# Patient Record
Sex: Female | Born: 1960 | Race: White | Hispanic: No | Marital: Married | Smoking: Never smoker
Health system: Southern US, Community
[De-identification: ages and names within clinical notes are randomized; demographics above are authoritative.]

## PROBLEM LIST (undated history)

## (undated) DIAGNOSIS — Z803 Family history of malignant neoplasm of breast: Secondary | ICD-10-CM

## (undated) DIAGNOSIS — N319 Neuromuscular dysfunction of bladder, unspecified: Secondary | ICD-10-CM

## (undated) DIAGNOSIS — IMO0001 Reserved for inherently not codable concepts without codable children: Secondary | ICD-10-CM

## (undated) DIAGNOSIS — G43909 Migraine, unspecified, not intractable, without status migrainosus: Secondary | ICD-10-CM

## (undated) DIAGNOSIS — C50411 Malignant neoplasm of upper-outer quadrant of right female breast: Principal | ICD-10-CM

## (undated) DIAGNOSIS — C50919 Malignant neoplasm of unspecified site of unspecified female breast: Secondary | ICD-10-CM

## (undated) DIAGNOSIS — Z973 Presence of spectacles and contact lenses: Secondary | ICD-10-CM

## (undated) DIAGNOSIS — Z800 Family history of malignant neoplasm of digestive organs: Secondary | ICD-10-CM

## (undated) DIAGNOSIS — M8580 Other specified disorders of bone density and structure, unspecified site: Secondary | ICD-10-CM

## (undated) DIAGNOSIS — IMO0002 Reserved for concepts with insufficient information to code with codable children: Secondary | ICD-10-CM

## (undated) HISTORY — DX: Other specified disorders of bone density and structure, unspecified site: M85.80

## (undated) HISTORY — DX: Reserved for concepts with insufficient information to code with codable children: IMO0002

## (undated) HISTORY — DX: Reserved for inherently not codable concepts without codable children: IMO0001

## (undated) HISTORY — DX: Malignant neoplasm of upper-outer quadrant of right female breast: C50.411

## (undated) HISTORY — PX: VEIN LIGATION: SHX2652

## (undated) HISTORY — PX: UPPER GI ENDOSCOPY: SHX6162

## (undated) HISTORY — DX: Family history of malignant neoplasm of digestive organs: Z80.0

## (undated) HISTORY — PX: COLONOSCOPY: SHX174

## (undated) HISTORY — DX: Migraine, unspecified, not intractable, without status migrainosus: G43.909

## (undated) HISTORY — PX: BREAST SURGERY: SHX581

## (undated) HISTORY — DX: Malignant neoplasm of unspecified site of unspecified female breast: C50.919

## (undated) HISTORY — DX: Neuromuscular dysfunction of bladder, unspecified: N31.9

## (undated) HISTORY — DX: Family history of malignant neoplasm of breast: Z80.3

---

## 2004-08-19 ENCOUNTER — Other Ambulatory Visit: Admission: RE | Admit: 2004-08-19 | Discharge: 2004-08-19 | Payer: Self-pay | Admitting: Radiology

## 2014-06-28 DIAGNOSIS — C50919 Malignant neoplasm of unspecified site of unspecified female breast: Secondary | ICD-10-CM

## 2014-06-28 HISTORY — DX: Malignant neoplasm of unspecified site of unspecified female breast: C50.919

## 2014-10-11 ENCOUNTER — Encounter: Payer: Self-pay | Admitting: *Deleted

## 2014-10-11 ENCOUNTER — Telehealth: Payer: Self-pay | Admitting: *Deleted

## 2014-10-11 DIAGNOSIS — C50411 Malignant neoplasm of upper-outer quadrant of right female breast: Secondary | ICD-10-CM

## 2014-10-11 DIAGNOSIS — C50412 Malignant neoplasm of upper-outer quadrant of left female breast: Secondary | ICD-10-CM | POA: Insufficient documentation

## 2014-10-11 HISTORY — DX: Malignant neoplasm of upper-outer quadrant of right female breast: C50.411

## 2014-10-11 NOTE — Telephone Encounter (Signed)
Confirmed [facility] for 10/16/14 at 1200 .  Instructions and contact information given.

## 2014-10-16 ENCOUNTER — Encounter: Payer: Self-pay | Admitting: Physical Therapy

## 2014-10-16 ENCOUNTER — Ambulatory Visit (HOSPITAL_BASED_OUTPATIENT_CLINIC_OR_DEPARTMENT_OTHER): Payer: PRIVATE HEALTH INSURANCE | Admitting: Hematology and Oncology

## 2014-10-16 ENCOUNTER — Ambulatory Visit
Admission: RE | Admit: 2014-10-16 | Discharge: 2014-10-16 | Disposition: A | Discharge status: Home / Self Care | Payer: PRIVATE HEALTH INSURANCE | Source: Ambulatory Visit | Attending: Radiation Oncology | Admitting: Radiation Oncology

## 2014-10-16 ENCOUNTER — Encounter (INDEPENDENT_AMBULATORY_CARE_PROVIDER_SITE_OTHER): Payer: Self-pay

## 2014-10-16 ENCOUNTER — Encounter: Payer: Self-pay | Admitting: Hematology and Oncology

## 2014-10-16 ENCOUNTER — Ambulatory Visit: Payer: Self-pay | Admitting: Surgery

## 2014-10-16 ENCOUNTER — Telehealth: Payer: Self-pay | Admitting: Hematology and Oncology

## 2014-10-16 ENCOUNTER — Ambulatory Visit: Payer: PRIVATE HEALTH INSURANCE | Attending: Surgery | Admitting: Physical Therapy

## 2014-10-16 ENCOUNTER — Other Ambulatory Visit (HOSPITAL_BASED_OUTPATIENT_CLINIC_OR_DEPARTMENT_OTHER): Payer: PRIVATE HEALTH INSURANCE

## 2014-10-16 ENCOUNTER — Ambulatory Visit: Payer: PRIVATE HEALTH INSURANCE

## 2014-10-16 ENCOUNTER — Other Ambulatory Visit: Payer: Self-pay | Admitting: Hematology and Oncology

## 2014-10-16 VITALS — BP 93/80 | HR 73 | Temp 98.0°F | Resp 18 | Ht 60.0 in | Wt 112.7 lb

## 2014-10-16 DIAGNOSIS — Z9189 Other specified personal risk factors, not elsewhere classified: Secondary | ICD-10-CM

## 2014-10-16 DIAGNOSIS — Z170 Estrogen receptor positive status [ER+]: Secondary | ICD-10-CM | POA: Diagnosis not present

## 2014-10-16 DIAGNOSIS — C50412 Malignant neoplasm of upper-outer quadrant of left female breast: Secondary | ICD-10-CM

## 2014-10-16 DIAGNOSIS — C50411 Malignant neoplasm of upper-outer quadrant of right female breast: Secondary | ICD-10-CM

## 2014-10-16 LAB — CBC WITH DIFFERENTIAL/PLATELET
BASO%: 0.5 % (ref 0.0–2.0)
BASOS ABS: 0 10*3/uL (ref 0.0–0.1)
EOS ABS: 0.1 10*3/uL (ref 0.0–0.5)
EOS%: 0.8 % (ref 0.0–7.0)
HCT: 39.4 % (ref 34.8–46.6)
HGB: 13.1 g/dL (ref 11.6–15.9)
LYMPH%: 28.1 % (ref 14.0–49.7)
MCH: 30.3 pg (ref 25.1–34.0)
MCHC: 33.2 g/dL (ref 31.5–36.0)
MCV: 91 fL (ref 79.5–101.0)
MONO#: 0.7 10*3/uL (ref 0.1–0.9)
MONO%: 7.6 % (ref 0.0–14.0)
NEUT%: 63 % (ref 38.4–76.8)
NEUTROS ABS: 5.5 10*3/uL (ref 1.5–6.5)
PLATELETS: 202 10*3/uL (ref 145–400)
RBC: 4.33 10*6/uL (ref 3.70–5.45)
RDW: 13 % (ref 11.2–14.5)
WBC: 8.7 10*3/uL (ref 3.9–10.3)
lymph#: 2.4 10*3/uL (ref 0.9–3.3)

## 2014-10-16 LAB — COMPREHENSIVE METABOLIC PANEL (CC13)
ALT: 24 U/L (ref 0–55)
AST: 28 U/L (ref 5–34)
Albumin: 4 g/dL (ref 3.5–5.0)
Alkaline Phosphatase: 77 U/L (ref 40–150)
Anion Gap: 13 mEq/L — ABNORMAL HIGH (ref 3–11)
BUN: 10 mg/dL (ref 7.0–26.0)
CHLORIDE: 102 meq/L (ref 98–109)
CO2: 24 meq/L (ref 22–29)
CREATININE: 0.7 mg/dL (ref 0.6–1.1)
Calcium: 8.6 mg/dL (ref 8.4–10.4)
EGFR: >90 mL/min/{1.73_m2} (ref >90)
GLUCOSE: 140 mg/dL (ref 70–140)
Potassium: 4.1 mEq/L (ref 3.5–5.1)
Sodium: 139 mEq/L (ref 136–145)
TOTAL PROTEIN: 6.7 g/dL (ref 6.4–8.3)
Total Bilirubin: 0.38 mg/dL (ref 0.20–1.20)

## 2014-10-16 MED ORDER — DEXAMETHASONE 4 MG PO TABS: 4.0000 mg | ORAL_TABLET | Freq: Two times a day (BID) | ORAL | Status: DC

## 2014-10-16 MED ORDER — LIDOCAINE-PRILOCAINE 2.5-2.5 % EX CREA: TOPICAL_CREAM | CUTANEOUS | Status: DC

## 2014-10-16 MED ORDER — LORAZEPAM 0.5 MG PO TABS: 0.5000 mg | ORAL_TABLET | Freq: Four times a day (QID) | ORAL | Status: DC | PRN

## 2014-10-16 MED ORDER — PROCHLORPERAZINE MALEATE 10 MG PO TABS: 10.0000 mg | ORAL_TABLET | Freq: Four times a day (QID) | ORAL | Status: DC | PRN

## 2014-10-16 MED ORDER — ONDANSETRON HCL 8 MG PO TABS: 8.0000 mg | ORAL_TABLET | Freq: Two times a day (BID) | ORAL | Status: DC

## 2014-10-16 NOTE — Therapy (Signed)
[place], [place], [address] Phone: [phone]   Fax:  [phone]  Physical Therapy Evaluation  Patient Details  Name: [name] [name] MRN: [id number] Date of Birth: Oct 31, 1960 Referring Provider:  Erroll Luna, MD  Encounter Date: 10/16/2014      PT End of Session - 10/16/14 1704    Visit Number 1   Number of Visits 1   PT Start Time 1545   PT Stop Time 1612   PT Time Calculation (min) 27 min   Activity Tolerance Patient tolerated treatment well   Behavior During Therapy [facility] for tasks assessed/performed      Past Medical History  Diagnosis Date  . Breast cancer of upper-outer quadrant of right female breast 10/11/2014  . Osteopenia   . Migraines   . Neurogenic bladder     Past Surgical History  Procedure Laterality Date  . Vein ligation      There were no vitals filed for this visit.  Visit Diagnosis:  Carcinoma of upper-outer quadrant of left female breast - Plan: PT plan of care cert/re-cert  At risk for lymphedema - Plan: PT plan of care cert/re-cert      Subjective Assessment - 10/16/14 1658    Subjective Patient was seen today for a baseline assessment of her newly diagnosed left breast cancer.   Patient is accompained by: Family member   Pertinent History Diagnosed 10/03/14 with left ER/PR negative, HER2 positive breast cancer.  She also has a positive left axillary node.   Patient Stated Goals Learn post op shoulder ROM HEP and lymphedema risk reduction.   Currently in Pain? No/denies            [facility] PT Assessment - 10/16/14 0001    Assessment   Medical Diagnosis Left breast cancer   Onset Date 10/03/14   Precautions   Precautions Other (comment)  Active cancer   Restrictions   Weight Bearing Restrictions No   Balance Screen   Has the patient fallen in the past 6 months No   Has the patient had a decrease in activity level because of a fear of falling?  No    Is the patient reluctant to leave their home because of a fear of falling?  No   Home Environment   Living Enviornment Private residence   Living Arrangements Spouse/significant other;Children  53 y.o. 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   Leisure She plays tennis, golf, runs, Pilates - some kind of cardio 3-4x/wk for 30-60 minutes   Cognition   Overall Cognitive Status Within Functional Limits for tasks assessed   Posture/Postural Control   Posture/Postural Control No significant limitations   ROM / Strength   AROM / PROM / Strength AROM;Strength   AROM   AROM Assessment Site Shoulder   Right/Left Shoulder Right;Left   Right Shoulder Extension 50 Degrees   Right Shoulder Flexion 146 Degrees   Right Shoulder ABduction 158 Degrees   Right Shoulder Internal Rotation 66 Degrees   Right Shoulder External Rotation 86 Degrees   Left Shoulder Extension 55 Degrees   Left Shoulder Flexion 145 Degrees   Left Shoulder ABduction 161 Degrees   Left Shoulder Internal Rotation 70 Degrees   Left Shoulder External Rotation 82 Degrees   Strength   Overall  Strength Within functional limits for tasks performed           LYMPHEDEMA/ONCOLOGY QUESTIONNAIRE - 10/16/14 1702    Type   Cancer Type Left breast   Lymphedema Assessments   Lymphedema Assessments Upper extremities   Right Upper Extremity Lymphedema   10 cm Proximal to Olecranon Process 22.9 cm   Olecranon Process 21.5 cm   10 cm Proximal to Ulnar Styloid Process 18.5 cm   Just Proximal to Ulnar Styloid Process 13.4 cm   Across Hand at [employer] 16.4 cm   At [place] of 2nd Digit 5.5 cm   Left Upper Extremity Lymphedema   10 cm Proximal to Olecranon Process 23.4 cm   Olecranon Process 21.6 cm   10 cm Proximal to Ulnar  Styloid Process 18.2 cm   Just Proximal to Ulnar Styloid Process 13.3 cm   Across Hand at [employer] 16.4 cm   At [place] of 2nd Digit 6 cm       Patient was instructed today in a home exercise program today for post op shoulder range of motion. These included active assist shoulder flexion in sitting, scapular retraction, wall walking with shoulder abduction, and hands behind head external rotation.  She was encouraged to do these twice a day, holding 3 seconds and repeating 5 times when permitted by her physician.         PT Education - 10/16/14 1704    Education provided Yes   Education Details Post op HEP and lymphedema risk reduction   Person(s) Educated Patient;Spouse   Methods Explanation;Demonstration;Handout   Comprehension Verbalized understanding;Returned demonstration              Breast Clinic Goals - 10/16/14 1707    Patient will be able to verbalize understanding of pertinent lymphedema risk reduction practices relevant to her diagnosis specifically related to skin care.   Time 1   Period Days   Status Achieved   Patient will be able to return demonstrate and/or verbalize understanding of the post-op home exercise program related to regaining shoulder range of motion.   Time 1   Period Days   Status Achieved   Patient will be able to verbalize understanding of the importance of attending the postoperative After Breast Cancer Class for further lymphedema risk reduction education and therapeutic exercise.   Time 1   Period Days   Status Achieved              Plan - 10/16/14 1704    Clinical Impression Statement Patient was seen today for an assessment related to her left breast cancer.  She is planning to have neoadjuvant chemotherapy followed by either a left lumpectomy and sentinel node biopsy versus axillary lymph node dissection or a bilateral mastectomy with a elft sentinel node biopsy versus an axillary node dissection.  This will be followed  by radiation.  She will benefit from post operative PT to regain shoulder ROM and strength and reduce lymphjedema risk.   Pt will benefit from skilled therapeutic intervention in order to improve on the following deficits Decreased strength;Pain;Decreased knowledge of use of DME;Impaired UE functional use;Increased edema;Decreased range of motion   Rehab Potential Excellent   Clinical Impairments Affecting Rehab Potential none   PT Frequency One time visit   PT Treatment/Interventions Therapeutic exercise;Patient/family education   Consulted and Agree with Plan of Care Patient;Family member/caregiver   Family Member Consulted husband     Patient will follow up at outpatient cancer rehab if  needed following surgery.  If the patient requires physical therapy at that time, a specific plan will be dictated and sent to the referring physician for approval. The patient was educated today on appropriate basic range of motion exercises to begin post operatively and the importance of attending the After Breast Cancer class following surgery.  Patient was educated today on lymphedema risk reduction practices as it pertains to recommendations that will benefit the patient immediately following surgery.  She verbalized good understanding.  No additional physical therapy is indicated at this time.       Problem List Patient Active Problem List   Diagnosis Date Noted  . Breast cancer of upper-outer quadrant of left female breast 10/11/2014    Annia Friendly, PT 10/16/2014, 5:09 PM  [place], [place], [address] Phone: [phone]   Fax:  [phone]

## 2014-10-16 NOTE — H&P (Signed)
[name] [name] 10/16/2014 7:49 AM Location: [facility] Patient #: [id number] DOB: 06/18/1961 Undefined / Language: [name] [name] / Race: Undefined Female History of Present Illness Marcello Moores A. Arieonna Medine MD; 10/16/2014 3:55 PM) Patient words: pt presents to the [facility] for left breast cancer. Sent at the request of Dr Dwana Curd.    1.7 cm mass left breast 2 oclock with positive LN bx.  no other complaints.  The patient is a 54 year old female who presents with breast cancer. 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 Tumor markers include HER -2/neu positive. Disease involvement includes ipsilateral axillary lymph nodes. No associated conditions are noted. The patient was referred by a specialty consultant. Initial presentation was 1 month(s) ago. Other Problems Anderson Malta [place], [place]; 10/16/2014 7:49 AM) Anxiety Disorder Asthma Bladder Problems Breast Cancer General anesthesia - complications Hemorrhoids Lump In Breast Migraine Headache  Past Surgical History Anderson Malta [place], RMA; 10/16/2014 7:49 AM) Breast Biopsy Bilateral. multiple Colon Polyp Removal - Colonoscopy Oral Surgery  Diagnostic Studies History Anderson Malta [place], RMA; 10/16/2014 7:49 AM) Colonoscopy 1-5 years ago Mammogram within last year Pap Smear 1-5 years ago  Social History Anderson Malta [place], RMA; 10/16/2014 7:49 AM) Alcohol use Occasional alcohol use. Caffeine use Coffee, Tea. No drug use Tobacco use Never smoker.  Family History Anderson Malta [place], [place]; 10/16/2014 7:49 AM) Alcohol Abuse Family Members In General. Arthritis Father. Breast Cancer Family Members In General. Cancer Mother. Colon Cancer Brother, Father. Colon Polyps Brother, Father. Diabetes Mellitus Family Members In General. Heart Disease Family Members In General. Hypertension Family Members In General. Melanoma Brother. Respiratory Condition Family  Members In General. Thyroid problems Father.  Pregnancy / Birth History Anderson Malta [place], [place]; 10/16/2014 7:50 AM) Age at menarche 24 years. Contraceptive History Oral contraceptives. Gravida 3 Maternal age 58-30 Para 3 Regular periods     Review of Systems Anderson Malta [place] RMA; 10/16/2014 7:50 AM) General Present- Fatigue and Night Sweats. Not Present- Appetite Loss, Chills, Fever, Weight Gain and Weight Loss. Skin Present- Change in Wart/Mole and Rash. Not Present- Dryness, Hives, Jaundice, New Lesions, Non-Healing Wounds and Ulcer. HEENT Present- Wears glasses/contact lenses. Not Present- Earache, Hearing Loss, Hoarseness, Nose Bleed, Oral Ulcers, Ringing in the Ears, Seasonal Allergies, Sinus Pain, Sore Throat, Visual Disturbances and Yellow Eyes. Respiratory Not Present- Bloody sputum, Chronic Cough, Difficulty Breathing, Snoring and Wheezing. Breast Present- Breast Mass, Breast Pain and Skin Changes. Not Present- Nipple Discharge. Cardiovascular Not Present- Chest Pain, Difficulty Breathing Lying Down, Leg Cramps, Palpitations, Rapid Heart Rate, Shortness of Breath and Swelling of Extremities. Gastrointestinal Not Present- Abdominal Pain, Bloating, Bloody Stool, Change in Bowel Habits, Chronic diarrhea, Constipation, Difficulty Swallowing, Excessive gas, Gets full quickly at meals, Hemorrhoids, Indigestion, Nausea, Rectal Pain and Vomiting. Female Genitourinary Not Present- Frequency, Nocturia, Painful Urination, Pelvic Pain and Urgency. Musculoskeletal Not Present- Back Pain, Joint Pain, Joint Stiffness, Muscle Pain, Muscle Weakness and Swelling of Extremities. Neurological Not Present- Decreased Memory, Fainting, Headaches, Numbness, Seizures, Tingling, Tremor, Trouble walking and Weakness. Psychiatric Not Present- Anxiety, Bipolar, Change in Sleep Pattern, Depression, Fearful and Frequent crying. Hematology Not Present- Easy Bruising, Excessive bleeding, Gland problems, HIV and  Persistent Infections.   Physical Exam ([place]et [place] A. Ersilia Brawley MD; 10/16/2014 3:57 PM)  Chest and Lung Exam Chest and lung exam reveals -quiet, even and easy respiratory effort with no use of accessory muscles and on auscultation, normal breath sounds, no adventitious sounds and normal vocal resonance. Inspection Chest Wall - Normal. Back -  normal.  Breast Note: moblie 1 cm left breast mass left axilla LN palpable  right breast normal  B cup size   Cardiovascular Cardiovascular examination reveals -on palpation PMI is normal in location and amplitude, no palpable S3 or S4. Normal cardiac borders., normal heart sounds, regular rate and rhythm with no murmurs, carotid auscultation reveals no bruits and normal pedal pulses bilaterally.  Neurologic Neurologic evaluation reveals -alert and oriented x 3 with no impairment of recent or remote memory. Mental Status-Normal.  Musculoskeletal Normal Exam - Left-Upper Extremity Strength Normal and Lower Extremity Strength Normal. Normal Exam - Right-Upper Extremity Strength Normal, Lower Extremity Weakness.  Lymphatic Axillary -Note:left axillary LN palpable .  right axilla normal.     Assessment & Plan (Atari Novick A. Veasna Santibanez MD; 10/16/2014 3:59 PM)  BREAST CANCER, STAGE 2, LEFT (174.9  C50.912) Impression: given positive node, chemotherapy recommended and patient desires bilateral mastectomy and reconstruction. good candidate for breast conservation as well. Good candidate for NSM as well. Will set up pot. Risk bleeding, infection, PTX hemothorax, migration malfuction, embolization, and the need for mor surgery. She agrees to proceed.

## 2014-10-16 NOTE — Progress Notes (Signed)
Checked in new pt with no financial concerns prior to seeing the dr. Informed pt if chemo is part of her treatment we will call her ins to see if Josem Kaufmann is req and will obtain it if it is as well as contact foundations that offer copay assistance for chemo if needed. She has my card for any billing questions or concerns.

## 2014-10-16 NOTE — Progress Notes (Signed)
Radiation Oncology         [phone] ________________________________  Initial outpatient Consultation  Name: [name] [name] MRN: [id number]  Date: 10/16/2014  DOB: [birth year]  TM:LYYTKPT, [name] Kidney, MD  Erroll Luna, MD   REFERRING PHYSICIAN: Erroll Luna, MD  DIAGNOSIS: Clinical stage II invasive ductal carcinoma of the left breast (T1c, N1, MX)  HISTORY OF PRESENT ILLNESS::[name] [name] is a 54 y.o. female who is seen out courtesy of Dr. Brantley Stage as part of the multidisciplinary breast clinic. Earlier this year the patient palpated a mass within the left axillary region followed by a palpable mass in the upper outer quadrant of the left breast. On mammogram both these areas were visible. By ultrasound the lesion in the upper outer quadrant measured approximately 1.7 cm, 2:00 position. This area was biopsied which revealed grade 3 invasive ductal carcinoma. Left axillary lymph node was also positive on biopsy. Tumor was ER and PR negative with HER-2/neu amplification with a ratio of 5.32.  the patient's imaging studies and case was presented earlier today at the multidisciplinary breast conference.  PREVIOUS RADIATION THERAPY: No  PAST MEDICAL HISTORY:  has a past medical history of Breast cancer of upper-outer quadrant of right female breast (10/11/2014); Osteopenia; Migraines; and Neurogenic bladder.    PAST SURGICAL HISTORY: Past Surgical History  Procedure Laterality Date  . Vein ligation      FAMILY HISTORY: family history includes Brain cancer in her mother; Breast cancer in her paternal aunt; Colon cancer in her father and maternal uncle; Lung cancer in her maternal grandfather and paternal grandfather.  SOCIAL HISTORY:  reports that she has never smoked. She does not have any smokeless tobacco history on file. She reports that she drinks alcohol. She reports that she does not use illicit drugs.  ALLERGIES: Butorphanol; Meperidine; and Sulfa antibiotics  MEDICATIONS:    Current Outpatient Prescriptions  Medication Sig Dispense Refill  . acetaminophen (TYLENOL) 325 MG tablet Take 650 mg by mouth every 6 (six) hours as needed.    . Aspirin-Acetaminophen-Caffeine (GOODYS EXTRA STRENGTH PO) Take by mouth.    Marland Kitchen atenolol-chlorthalidone (TENORETIC) 50-25 MG per tablet Take by mouth.    . B Complex-Biotin-FA (B-COMPLEX PO) Take by mouth.    Marland Kitchen BLACK COHOSH PO Take by mouth.    . Calcium Carbonate (CALCIUM 600 PO) Take by mouth.    . eletriptan (RELPAX) 40 MG tablet Take by mouth.    . EVENING PRIMROSE OIL PO Take 1,300 mg by mouth.    Marland Kitchen ibuprofen (ADVIL,MOTRIN) 100 MG tablet Take 100 mg by mouth every 6 (six) hours as needed for fever.    . Lactobacillus (ACIDOPHILUS PO) Take by mouth.    Marland Kitchen PARoxetine (PAXIL) 20 MG tablet Take 20 mg by mouth.    . solifenacin (VESICARE) 5 MG tablet Take 10 mg by mouth.    Marland Kitchen VITAMIN D, CHOLECALCIFEROL, PO Take by mouth.     No current facility-administered medications for this encounter.    REVIEW OF SYSTEMS:  A 15 point review of systems is documented in the electronic medical record. This was obtained by the nursing staff. However, I reviewed this with the patient to discuss relevant findings and make appropriate changes. The patient palpated the areas of concern on self examination. She also noticed some mild inversion of her left nipple. She denies any nipple discharge or bleeding.   PHYSICAL EXAM:  Vitals - 1 value per visit 4/65/6812  SYSTOLIC 93  DIASTOLIC 80  Pulse 73  Temperature 98  Respirations 18  Weight (lb) 112.7  Height _0   BMI 22.01  VISIT REPORT     In general this is a very pleasant 54 year old female in no acute distress. She is accompanied by her husband  on evaluation today. Examination of the neck and supraclavicular region reveals no evidence of adenopathy. The lungs are clear to auscultation. The heart has a regular rhythm and rate. The right axilla is clear. The left axilla reveals a palpable  approximately 1-1/2 cm lymph node. The left breast shows a palpable abnormality at approximately the 2:00 position measuring approximately 1.5 cm in size. The left nipple is mildly retracted. No nipple discharge or bleeding noted.   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  The patient has a high-grade lesion, HER-2/neu positive and likely benefit from neoadjuvant chemotherapy. Patient would appear to be a candidate for breast conservation therapy however given her strong family history she is leaning towards mastectomy for definitive management consideration for prophylactic right mastectomy. If the patient does consider breast conserving therapy then she will need to proceed with an MRI to rule out other lesions within the left breast (multi centric) the patient may be a potential candidate for the Alliance trial and information concerning this study was given to the patient. She will also meet with the research nurse later today. Patient will also proceed with staging workup including CT scan of the chest abdomen and pelvis as well as bone scan given her high grade lesion and stage.  PLAN: The patient will be seen at a later date after she is completed her neoadjuvant chemotherapy and definitive surgery.    ------------------------------------------------  Blair Promise, PhD, MD

## 2014-10-16 NOTE — Assessment & Plan Note (Signed)
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:00 position, left axillary lymph node palpable, ER/PR negative, 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 5.32, T1 cN1 M0 stage II a clinical stage  Pathology and radiology review:Discussed with the patient, the details of pathology including the type of breast cancer,the clinical staging, the significance of ER, PR and HER-2/neu receptors and the implications for treatment. After reviewing the pathology in detail, we proceeded to discuss the different treatment options between surgery, radiation, chemotherapy, antiestrogen therapies.  Recommendation: 1. Breast MRI 2. Neoadjuvant chemotherapy with Taxotere, carboplatin, Herceptin and Perjeta every 3 weeks 6 cycles to start 11/01/2014 3. Echocardiogram for cardiac evaluation 4. Port placement 5. Chemotherapy education  Chemotherapy counseling:I have discussed the risks and benefits of chemotherapy including the risks of nausea/ vomiting, risk of infection from low WBC count, fatigue due to chemo or anemia, bruising or bleeding due to low platelets, mouth sores, loss/ change in taste and decreased appetite. Liver and kidney function will be monitored through out chemotherapy as abnormalities in liver and kidney function may be a side effect of treatment. Cardiac dysfunction due to Herceptin and Perjeta were discussed in detail, as well as diarrhea risk with Perjeta. Risk of permanent bone marrow dysfunction and leukemia due to chemo were also discussed.  Return to clinic on May 6 at the beginning of chemotherapy.   

## 2014-10-16 NOTE — Patient Instructions (Signed)

## 2014-10-16 NOTE — Progress Notes (Signed)
MD note created during OV sent to scan.  Copy to patient.

## 2014-10-16 NOTE — Addendum Note (Signed)
Addended by: Prentiss Bells on: 10/16/2014 06:44 PM   Modules accepted: Orders, Medications

## 2014-10-16 NOTE — Telephone Encounter (Signed)
Echo sent to linda d to precert and I will call her with chemo class and echo appointment

## 2014-10-16 NOTE — Progress Notes (Addendum)
[place] NOTE  Patient Care Team: Laray Anger, MD as PCP - General (Obstetrics and Gynecology) Erroll Luna, MD as Consulting Physician (General Surgery) Nicholas Lose, MD as Consulting Physician (Hematology and Oncology) Gery Pray, MD as Consulting Physician (Radiation Oncology) Mauro Kaufmann, RN as Registered Nurse Rockwell Germany, RN as Registered Nurse  CHIEF COMPLAINTS/PURPOSE OF CONSULTATION:  Newly diagnosed breast cancer  HISTORY OF PRESENTING ILLNESS:  [name] [name] 54 y.o. female is here because of recent diagnosis of left breast cancer. Patient felt a palpable left axillary lesion which was then evaluated with a mammogram. In the interim she also felt the ill-defined mass in the left breast as well. The mammogram revealed not only the axillary lymph node but also the breast lesion. By ultrasound measured 1.7 cm at 2:00 position. This was biopsied and was foreshortened to be grade 3 invasive ductal carcinoma. Axillary lymph node was positive for cancer. The tumor was ER PR negative HER-2 positive with a ratio 5.32. In addition to that there was a 1.2 cm cyst in the breast. She was presented this morning in the multidisciplinary tumor board and we reviewed her films and pathology and she is here today at [facility] clinic to discuss a treatment plan.   I reviewed her records extensively and collaborated the history with the patient.  SUMMARY OF ONCOLOGIC HISTORY:   Breast cancer of upper-outer quadrant of left female breast   10/01/2014 Mammogram Left breast mammogram and ultrasound for palpable left breast mass which was a maxillary mass with an ill-defined abnormality by ultrasound 1.7 cm at 2:00   10/11/2014 Initial Diagnosis Left breast invasive ductal carcinoma, grade 3, ER/PR negative, 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 5.32    In terms of breast cancer risk profile:  She menarched at early age of 76  She had 3 pregnancy, her first child was born at age 48  She has  not received birth control pills.  She was never exposed to fertility medications or hormone replacement therapy.  She has has family history of Breast/GYN/GI cancer Father age 23 colon cancer, mother age 29 brain tumor glioblastoma, uncle age 27 colon cancer, paternal grandmother 70 breast cancer, paternal grandfather 35 lung cancer, maternal grandfather 84 lung cancer, brother age 62 melanoma in situ  MEDICAL HISTORY:  Past Medical History  Diagnosis Date  . Breast cancer of upper-outer quadrant of right female breast 10/11/2014  . Osteopenia   . Migraines   . Neurogenic bladder     SURGICAL HISTORY: Past Surgical History  Procedure Laterality Date  . Vein ligation      SOCIAL HISTORY: History   Social History  . Marital Status: Unknown    Spouse Name: N/A  . Number of Children: N/A  . Years of Education: N/A   Occupational History  . Not on file.   Social History Main Topics  . Smoking status: Never Smoker   . Smokeless tobacco: Not on file  . Alcohol Use: Yes     Comment: 4-5  . Drug Use: No  . Sexual Activity: Not on file   Other Topics Concern  . Not on file   Social History Narrative    FAMILY HISTORY: Family History  Problem Relation Age of Onset  . Brain cancer Mother   . Colon cancer Father   . Colon cancer Maternal Uncle   . Breast cancer Paternal Aunt   . Lung cancer Maternal Grandfather   . Lung cancer Paternal Grandfather  ALLERGIES:  is allergic to butorphanol; meperidine; and sulfa antibiotics.  MEDICATIONS:  Current Outpatient Prescriptions  Medication Sig Dispense Refill  . acetaminophen (TYLENOL) 325 MG tablet Take 650 mg by mouth every 6 (six) hours as needed.    . Aspirin-Acetaminophen-Caffeine (GOODYS EXTRA STRENGTH PO) Take by mouth.    Marland Kitchen atenolol-chlorthalidone (TENORETIC) 50-25 MG per tablet Take by mouth.    . B Complex-Biotin-FA (B-COMPLEX PO) Take by mouth.    Marland Kitchen BLACK COHOSH PO Take by mouth.    . Calcium Carbonate  (CALCIUM 600 PO) Take by mouth.    . eletriptan (RELPAX) 40 MG tablet Take by mouth.    . EVENING PRIMROSE OIL PO Take 1,300 mg by mouth.    Marland Kitchen ibuprofen (ADVIL,MOTRIN) 100 MG tablet Take 100 mg by mouth every 6 (six) hours as needed for fever.    . Lactobacillus (ACIDOPHILUS PO) Take by mouth.    Marland Kitchen PARoxetine (PAXIL) 20 MG tablet Take 20 mg by mouth.    . solifenacin (VESICARE) 5 MG tablet Take 10 mg by mouth.    Marland Kitchen VITAMIN D, CHOLECALCIFEROL, PO Take by mouth.     No current facility-administered medications for this visit.    REVIEW OF SYSTEMS:   Constitutional: Denies fevers, chills or abnormal night sweats Eyes: Denies blurriness of vision, double vision or watery eyes Ears, nose, mouth, throat, and face: Denies mucositis or sore throat Respiratory: Denies cough, dyspnea or wheezes Cardiovascular: Denies palpitation, chest discomfort or lower extremity swelling Gastrointestinal:  Denies nausea, heartburn or change in bowel habits Skin: Denies abnormal skin rashes Lymphatics: Palpable left axillary lymph node Neurological:Denies numbness, tingling or new weaknesses Behavioral/Psych: Mood is stable, no new changes  Breast: Palpable lump in the left breast and axilla All other systems were reviewed with the patient and are negative.  PHYSICAL EXAMINATION: ECOG PERFORMANCE STATUS: 1 - Symptomatic but completely ambulatory  Filed Vitals:   10/16/14 1250  BP: 93/80  Pulse: 73  Temp: 98 F (36.7 C)  Resp: 18   Filed Weights   10/16/14 1250  Weight: 112 lb 11.2 oz (51.12 kg)    GENERAL:alert, no distress and comfortable SKIN: skin color, texture, turgor are normal, no rashes or significant lesions EYES: normal, conjunctiva are pink and non-injected, sclera clear OROPHARYNX:no exudate, no erythema and lips, buccal mucosa, and tongue normal  NECK: supple, thyroid normal size, non-tender, without nodularity LYMPH:  no palpable lymphadenopathy in the cervical, axillary or  inguinal LUNGS: clear to auscultation and percussion with normal breathing effort HEART: regular rate & rhythm and no murmurs and no lower extremity edema ABDOMEN:abdomen soft, non-tender and normal bowel sounds Musculoskeletal:no cyanosis of digits and no clubbing  PSYCH: alert & oriented x 3 with fluent speech NEURO: no focal motor/sensory deficits BREAST: Palpable left axillary lymph node and left breast mass. (exam performed in the presence of a chaperone)   LABORATORY DATA:  I have reviewed the data as listed Lab Results  Component Value Date   WBC 8.7 10/16/2014   HGB 13.1 10/16/2014   HCT 39.4 10/16/2014   MCV 91.0 10/16/2014   PLT 202 10/16/2014   Lab Results  Component Value Date   NA 139 10/16/2014   K 4.1 10/16/2014   CO2 24 10/16/2014    RADIOGRAPHIC STUDIES: I have personally reviewed the radiological reports and agreed with the findings in the report. Results are summarized as above  ASSESSMENT AND PLAN:  Breast cancer of upper-outer quadrant of left female breast Left breast  invasive ductal carcinoma grade 3, 1.7 cm tumor at 2:00 position, left axillary lymph node palpable, ER/PR negative, 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 5.32, T1 cN1 M0 stage II a clinical stage  Pathology and radiology review:Discussed with the patient, the details of pathology including the type of breast cancer,the clinical staging, the significance of ER, PR and HER-2/neu receptors and the implications for treatment. After reviewing the pathology in detail, we proceeded to discuss the different treatment options between surgery, radiation, chemotherapy, antiestrogen therapies.  Recommendation: 1. Breast MRI 2. Neoadjuvant chemotherapy with Taxotere, carboplatin, Herceptin and Perjeta every 3 weeks 6 cycles to start 11/01/2014 3. Echocardiogram for cardiac evaluation 4. Port placement 5. Chemotherapy education 6. Staging with CT chest abdomen pelvis and bone scan  Chemotherapy counseling:I have  discussed the risks and benefits of chemotherapy including the risks of nausea/ vomiting, risk of infection from low WBC count, fatigue due to chemo or anemia, bruising or bleeding due to low platelets, mouth sores, loss/ change in taste and decreased appetite. Liver and kidney function will be monitored through out chemotherapy as abnormalities in liver and kidney function may be a side effect of treatment. Cardiac dysfunction due to Herceptin and Perjeta were discussed in detail, as well as diarrhea risk with Perjeta. Risk of permanent bone marrow dysfunction and leukemia due to chemo were also discussed.  Return to clinic on May 6 at the beginning of chemotherapy.  All questions were answered. The patient knows to call the clinic with any problems, questions or concerns.    Rulon Eisenmenger, MD 4:18 PM

## 2014-10-17 ENCOUNTER — Encounter: Payer: Self-pay | Admitting: *Deleted

## 2014-10-18 ENCOUNTER — Telehealth: Payer: Self-pay

## 2014-10-18 ENCOUNTER — Telehealth: Payer: Self-pay | Admitting: *Deleted

## 2014-10-18 NOTE — Telephone Encounter (Signed)
Scheduled bone scan, CT and echo.  Called patient to provide d/t.  Call completed by Bary Castilla.  Pt to pick up contrast when she comes in for echo Monday 4/25.

## 2014-10-18 NOTE — Telephone Encounter (Signed)
Spoke to pt concerning Cando from 10/16/14.  Discussed all future appts for echo, chemo class, CT/bone scan and f/u with Dr. Lindi Adie. Pt denies further needs at this time. Encourage pt to call with questions or concerns. Placed POF for pt to start chemo on 5/6 per Dr. Lindi Adie.

## 2014-10-21 ENCOUNTER — Ambulatory Visit (HOSPITAL_COMMUNITY)
Admission: RE | Admit: 2014-10-21 | Discharge: 2014-10-21 | Disposition: A | Discharge status: Home / Self Care | Payer: PRIVATE HEALTH INSURANCE | Source: Ambulatory Visit | Attending: Hematology and Oncology | Admitting: Hematology and Oncology

## 2014-10-21 ENCOUNTER — Telehealth: Payer: Self-pay | Admitting: Hematology and Oncology

## 2014-10-21 DIAGNOSIS — C50412 Malignant neoplasm of upper-outer quadrant of left female breast: Secondary | ICD-10-CM

## 2014-10-21 DIAGNOSIS — I340 Nonrheumatic mitral (valve) insufficiency: Secondary | ICD-10-CM | POA: Diagnosis not present

## 2014-10-21 NOTE — Telephone Encounter (Signed)
Appointments made and placed a note on patients schedule to get a new schedule on 4/27

## 2014-10-21 NOTE — Progress Notes (Signed)
  Echocardiogram 2D Echocardiogram has been performed.  Koby Pickup 10/21/2014, 11:08 AM

## 2014-10-22 ENCOUNTER — Ambulatory Visit
Admission: RE | Admit: 2014-10-22 | Discharge: 2014-10-22 | Disposition: A | Discharge status: Home / Self Care | Payer: PRIVATE HEALTH INSURANCE | Source: Ambulatory Visit | Attending: Hematology and Oncology | Admitting: Hematology and Oncology

## 2014-10-22 DIAGNOSIS — C50412 Malignant neoplasm of upper-outer quadrant of left female breast: Secondary | ICD-10-CM

## 2014-10-22 MED ORDER — GADOBENATE DIMEGLUMINE 529 MG/ML IV SOLN
10.0000 mL | Freq: Once | INTRAVENOUS | Status: AC | PRN
Start: 2014-10-22 — End: 2014-10-22
  Administered 2014-10-22: 10 mL via INTRAVENOUS

## 2014-10-23 ENCOUNTER — Other Ambulatory Visit: Payer: PRIVATE HEALTH INSURANCE

## 2014-10-23 ENCOUNTER — Encounter: Payer: Self-pay | Admitting: *Deleted

## 2014-10-24 ENCOUNTER — Other Ambulatory Visit: Payer: Self-pay | Admitting: Radiology

## 2014-10-24 ENCOUNTER — Encounter (HOSPITAL_BASED_OUTPATIENT_CLINIC_OR_DEPARTMENT_OTHER): Payer: Self-pay | Admitting: *Deleted

## 2014-10-28 ENCOUNTER — Other Ambulatory Visit (HOSPITAL_BASED_OUTPATIENT_CLINIC_OR_DEPARTMENT_OTHER): Payer: PRIVATE HEALTH INSURANCE

## 2014-10-28 ENCOUNTER — Encounter: Payer: Self-pay | Admitting: Genetic Counselor

## 2014-10-28 ENCOUNTER — Ambulatory Visit (HOSPITAL_BASED_OUTPATIENT_CLINIC_OR_DEPARTMENT_OTHER): Payer: PRIVATE HEALTH INSURANCE | Admitting: Genetic Counselor

## 2014-10-28 DIAGNOSIS — Z803 Family history of malignant neoplasm of breast: Secondary | ICD-10-CM | POA: Diagnosis not present

## 2014-10-28 DIAGNOSIS — Z808 Family history of malignant neoplasm of other organs or systems: Secondary | ICD-10-CM | POA: Diagnosis not present

## 2014-10-28 DIAGNOSIS — C50412 Malignant neoplasm of upper-outer quadrant of left female breast: Secondary | ICD-10-CM

## 2014-10-28 DIAGNOSIS — Z800 Family history of malignant neoplasm of digestive organs: Secondary | ICD-10-CM | POA: Diagnosis not present

## 2014-10-28 DIAGNOSIS — Z315 Encounter for genetic counseling: Secondary | ICD-10-CM

## 2014-10-28 DIAGNOSIS — C50919 Malignant neoplasm of unspecified site of unspecified female breast: Secondary | ICD-10-CM | POA: Insufficient documentation

## 2014-10-28 LAB — CBC WITH DIFFERENTIAL/PLATELET
BASO%: 0.6 % (ref 0.0–2.0)
Basophils Absolute: 0 10*3/uL (ref 0.0–0.1)
EOS ABS: 0.1 10*3/uL (ref 0.0–0.5)
EOS%: 1.5 % (ref 0.0–7.0)
HCT: 41.6 % (ref 34.8–46.6)
HGB: 13.8 g/dL (ref 11.6–15.9)
LYMPH%: 27.8 % (ref 14.0–49.7)
MCH: 29.6 pg (ref 25.1–34.0)
MCHC: 33.1 g/dL (ref 31.5–36.0)
MCV: 89.4 fL (ref 79.5–101.0)
MONO#: 0.7 10*3/uL (ref 0.1–0.9)
MONO%: 9.2 % (ref 0.0–14.0)
NEUT#: 4.6 10*3/uL (ref 1.5–6.5)
NEUT%: 60.9 % (ref 38.4–76.8)
Platelets: 269 10*3/uL (ref 145–400)
RBC: 4.66 10*6/uL (ref 3.70–5.45)
RDW: 13.2 % (ref 11.2–14.5)
WBC: 7.5 10*3/uL (ref 3.9–10.3)
lymph#: 2.1 10*3/uL (ref 0.9–3.3)

## 2014-10-28 LAB — COMPREHENSIVE METABOLIC PANEL (CC13)
ALT: 25 U/L (ref 0–55)
AST: 29 U/L (ref 5–34)
Albumin: 4.2 g/dL (ref 3.5–5.0)
Alkaline Phosphatase: 69 U/L (ref 40–150)
Anion Gap: 9 mEq/L (ref 3–11)
BILIRUBIN TOTAL: 0.6 mg/dL (ref 0.20–1.20)
BUN: 11 mg/dL (ref 7.0–26.0)
CALCIUM: 9.3 mg/dL (ref 8.4–10.4)
CO2: 25 mEq/L (ref 22–29)
CREATININE: 0.7 mg/dL (ref 0.6–1.1)
Chloride: 107 mEq/L (ref 98–109)
EGFR: >90 mL/min/{1.73_m2} (ref >90)
Glucose: 86 mg/dl (ref 70–140)
Potassium: 4.3 mEq/L (ref 3.5–5.1)
Sodium: 140 mEq/L (ref 136–145)
Total Protein: 7 g/dL (ref 6.4–8.3)

## 2014-10-28 NOTE — Progress Notes (Signed)
REFERRING PROVIDER: Laray Anger, MD [place], [place] [address]   [name] Lose, MD  PRIMARY PROVIDER:  Laray Anger, MD  PRIMARY REASON FOR VISIT:  1. Breast cancer of upper-outer quadrant of left female breast   2. Family history of colon cancer   3. Family history of breast cancer   4. Family history of glioblastoma      HISTORY OF PRESENT ILLNESS:   [name] [name], a 54 y.o. female, was seen for a [place] cancer genetics consultation at the request of Dr. Lindi [name] due to a personal and family history of cancer.  [name] [name] presents to clinic today to discuss the possibility of a hereditary predisposition to cancer, genetic testing, and to further clarify her future cancer risks, as well as potential cancer risks for family members.   In 2016, at the age of 15, [name] [name] was diagnosed with invasive ductal carcinoma of the left breast.  The tumor is ER-/PR-/Her2+. This will be treated with Chemotherapy and herceptin and bilateral mastectomy.  She is unclear whether she will need radiation.   CANCER HISTORY:    Breast cancer of upper-outer quadrant of left female breast   10/01/2014 Mammogram Left breast mammogram and ultrasound for palpable left breast mass which was a maxillary mass with an ill-defined abnormality by ultrasound 1.7 cm at 2:00   10/11/2014 Initial Diagnosis Left breast invasive ductal carcinoma, grade 3, ER/PR negative, 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 5.32     HORMONAL RISK FACTORS:  Menarche was at age 46.  First live birth at age 73.  OCP use for approximately 0 years.  Ovaries intact: yes.  Hysterectomy: no.  Menopausal status: premenopausal.  HRT use: 0 years. Colonoscopy: yes; is on a 5 year schedule based on FH, has had 2 "precancerous" polyps.. Mammogram within the last year: yes. Number of breast biopsies: 3. Up to date with pelvic exams:  yes. Any excessive radiation exposure in the past:  no  Past Medical History  Diagnosis Date  . Breast cancer  of upper-outer quadrant of right female breast 10/11/2014  . Osteopenia   . Migraines   . Neurogenic bladder   . Wears contact lenses   . Family history of breast cancer   . Breast cancer 2016    ER-/PR-/Her2+  . Family history of colon cancer     Past Surgical History  Procedure Laterality Date  . Vein ligation      both legs  . Breast surgery      breast bx-rt  . Colonoscopy    . Upper gi endoscopy      History   Social History  . Marital Status: Married    Spouse Name: N/A  . Number of Children: 3  . Years of Education: N/A   Social History Main Topics  . Smoking status: Never Smoker   . Smokeless tobacco: Not on file  . Alcohol Use: Yes     Comment: 4-5  . Drug Use: No  . Sexual Activity: Not on file   Other Topics Concern  . None   Social History Narrative     FAMILY HISTORY:  We obtained a detailed, 4-generation family history.  Significant diagnoses are listed below: Family History  Problem Relation Age of Onset  . Brain cancer Mother 70    Glioblastoma  . Colon cancer Father 30  . Colon cancer Maternal Uncle 53  . Lung cancer Paternal Aunt   . Lung cancer Maternal Grandfather 90  . Lung  cancer Paternal Grandfather 94  . Melanoma Brother 33    insitu  . Rheumatic fever Maternal Grandmother     in her 53s  . Breast cancer Paternal Grandmother 91   [name] [name] has three daughters who are cancer free.  She has two brothers, one who had melanoma insitu at 69.  Her parents are both deceased, her father was diagnosed with colon cancer at 73 and died at 63, her mother was diagnosed with a glioblastoma at age 57.  Her father had two sisters, one who had lung cancer.  His parents are deceased, his father died of lung cancer at 73 and his mother died of breast cancer at 5.  [name] [name] mother had one brother who had colon cancer at 71 and died soon afterward.  Her father had lung cancer at age 29. Patient's ancestors are of [place] descent. There is no  reported Ashkenazi Jewish ancestry. There is no known consanguinity.  GENETIC COUNSELING ASSESSMENT: [name] [name] is a 54 y.o. female with a personal and family history of cancer which somewhat suggestive of a hereditary cancer syndrome and predisposition to cancer. We, therefore, discussed and recommended the following at today's visit.   DISCUSSION: We reviewed the characteristics, features and inheritance patterns of hereditary cancer syndromes. We reviewed hereditary breast cancer syndromes involving BRCA mutations, but also CHEK2 mutations based on the breast and colon cancer in the family.  We also discussed genetic testing, including the appropriate family members to test, the process of testing, insurance coverage and turn-around-time for results. We discussed the implications of a negative, positive and/or variant of uncertain significant result. We recommended [name] [name] pursue genetic testing for the Comprehensive Cancer gene panel. The Comprehensive Cancer Panel offered by [facility] includes sequencing and/or deletion duplication testing of the following 32 genes: APC, ATM, AXIN2, BARD1, BMPR1A, BRCA1, BRCA2, BRIP1, CDH1, CDK4, CDKN2A, CHEK2, EPCAM, FANCC, MLH1, MSH2, MSH6, MUTYH, NBN, PALB2, PMS2, POLD1, POLE, PTEN, RAD51C, RAD51D, SCG5/GREM1, SMAD4, STK11, TP53, VHL, and XRCC2.     [name] [name] asked questions about diet and its influence on cancer.  We discussed that healthy lifestyles are the best way to ward off cancer risks, when hereditary cancer syndromes are not involved.  We discussed that her dietary questions are best directed to the nutritionist at the [facility].  PLAN: After considering the risks, benefits, and limitations, [name] [name]  provided informed consent to pursue genetic testing and the blood sample was sent to [facility] for analysis of the Comprehensive cancer panel. Results should be available within approximately 2-3 weeks' time, at which point they will be  disclosed by telephone to [name] [name], as will any additional recommendations warranted by these results. [name] [name] will receive a summary of her genetic counseling visit and a copy of her results once available. This information will also be available in Epic. We encouraged [name] [name] to remain in contact with cancer genetics annually so that we can continuously update the family history and inform her of any changes in cancer genetics and testing that may be of benefit for her family. [name] [name] questions were answered to her satisfaction today. Our contact information was provided should additional questions or concerns arise.  Lastly, we encouraged [name] [name] to remain in contact with cancer genetics annually so that we can continuously update the family history and inform her of any changes in cancer genetics and testing that may be of benefit for this family.   Ms.  [name] questions were  answered to her satisfaction today. Our contact information was provided should additional questions or concerns arise. Thank you for the referral and allowing [place] to share in the care of your patient.   Amarian Botero P. Florene Glen, [place], [facility] Certified Genetic Counselor [email or web address] phone: [phone]  The patient was seen for a total of 60 minutes in face-to-face genetic counseling.  This patient was discussed with Drs. Magrinat, [name] [name] and/or Burr Medico who agrees with the above.    _______________________________________________________________________ For Office Staff:  Number of people involved in session: 1 Was an Intern/ student involved with case: no

## 2014-10-29 ENCOUNTER — Ambulatory Visit (HOSPITAL_BASED_OUTPATIENT_CLINIC_OR_DEPARTMENT_OTHER): Payer: PRIVATE HEALTH INSURANCE | Admitting: Anesthesiology

## 2014-10-29 ENCOUNTER — Ambulatory Visit (HOSPITAL_COMMUNITY): Payer: PRIVATE HEALTH INSURANCE

## 2014-10-29 ENCOUNTER — Encounter (HOSPITAL_BASED_OUTPATIENT_CLINIC_OR_DEPARTMENT_OTHER)
Admission: RE | Disposition: A | Discharge status: Home / Self Care | Payer: Self-pay | Source: Ambulatory Visit | Attending: Surgery

## 2014-10-29 ENCOUNTER — Encounter (HOSPITAL_BASED_OUTPATIENT_CLINIC_OR_DEPARTMENT_OTHER): Payer: Self-pay | Admitting: Anesthesiology

## 2014-10-29 ENCOUNTER — Ambulatory Visit (HOSPITAL_BASED_OUTPATIENT_CLINIC_OR_DEPARTMENT_OTHER)
Admission: RE | Admit: 2014-10-29 | Discharge: 2014-10-29 | Disposition: A | Discharge status: Home / Self Care | Payer: PRIVATE HEALTH INSURANCE | Source: Ambulatory Visit | Attending: Surgery | Admitting: Surgery

## 2014-10-29 DIAGNOSIS — C50412 Malignant neoplasm of upper-outer quadrant of left female breast: Secondary | ICD-10-CM

## 2014-10-29 DIAGNOSIS — J45909 Unspecified asthma, uncomplicated: Secondary | ICD-10-CM | POA: Insufficient documentation

## 2014-10-29 DIAGNOSIS — G43909 Migraine, unspecified, not intractable, without status migrainosus: Secondary | ICD-10-CM | POA: Insufficient documentation

## 2014-10-29 DIAGNOSIS — C50912 Malignant neoplasm of unspecified site of left female breast: Secondary | ICD-10-CM

## 2014-10-29 DIAGNOSIS — Z803 Family history of malignant neoplasm of breast: Secondary | ICD-10-CM | POA: Insufficient documentation

## 2014-10-29 DIAGNOSIS — Z95828 Presence of other vascular implants and grafts: Secondary | ICD-10-CM

## 2014-10-29 DIAGNOSIS — C50919 Malignant neoplasm of unspecified site of unspecified female breast: Secondary | ICD-10-CM

## 2014-10-29 HISTORY — PX: PORTACATH PLACEMENT: SHX2246

## 2014-10-29 HISTORY — DX: Presence of spectacles and contact lenses: Z97.3

## 2014-10-29 SURGERY — INSERTION, TUNNELED CENTRAL VENOUS DEVICE, WITH PORT
Anesthesia: General | Site: Chest | Laterality: Right

## 2014-10-29 MED ORDER — CHLORHEXIDINE GLUCONATE 4 % EX LIQD: 1.0000 "application " | Freq: Once | CUTANEOUS | Status: DC

## 2014-10-29 MED ORDER — CEFAZOLIN SODIUM-DEXTROSE 2-3 GM-% IV SOLR
2.0000 g | INTRAVENOUS | Status: AC
  Administered 2014-10-29: 2 g via INTRAVENOUS

## 2014-10-29 MED ORDER — BUPIVACAINE-EPINEPHRINE (PF) 0.25% -1:200000 IJ SOLN
INTRAMUSCULAR | Status: AC
  Filled 2014-10-29: qty 30

## 2014-10-29 MED ORDER — HEPARIN (PORCINE) IN NACL 2-0.9 UNIT/ML-% IJ SOLN
INTRAMUSCULAR | Status: AC
  Filled 2014-10-29: qty 500

## 2014-10-29 MED ORDER — FENTANYL CITRATE (PF) 100 MCG/2ML IJ SOLN: 50.0000 ug | INTRAMUSCULAR | Status: DC | PRN

## 2014-10-29 MED ORDER — BUPIVACAINE-EPINEPHRINE 0.25% -1:200000 IJ SOLN
INTRAMUSCULAR | Status: DC | PRN
  Administered 2014-10-29: 10 mL

## 2014-10-29 MED ORDER — MIDAZOLAM HCL 5 MG/5ML IJ SOLN
INTRAMUSCULAR | Status: DC | PRN
  Administered 2014-10-29: 2 mg via INTRAVENOUS

## 2014-10-29 MED ORDER — MIDAZOLAM HCL 2 MG/2ML IJ SOLN
INTRAMUSCULAR | Status: AC
  Filled 2014-10-29: qty 2

## 2014-10-29 MED ORDER — HEPARIN (PORCINE) IN NACL 2-0.9 UNIT/ML-% IJ SOLN
INTRAMUSCULAR | Status: DC | PRN
  Administered 2014-10-29: 1 via INTRAVENOUS

## 2014-10-29 MED ORDER — HEPARIN SOD (PORK) LOCK FLUSH 100 UNIT/ML IV SOLN
INTRAVENOUS | Status: AC
  Filled 2014-10-29: qty 5

## 2014-10-29 MED ORDER — PROPOFOL 10 MG/ML IV BOLUS
INTRAVENOUS | Status: DC | PRN
  Administered 2014-10-29: 150 mg via INTRAVENOUS

## 2014-10-29 MED ORDER — DEXAMETHASONE SODIUM PHOSPHATE 4 MG/ML IJ SOLN
INTRAMUSCULAR | Status: DC | PRN
  Administered 2014-10-29: 10 mg via INTRAVENOUS

## 2014-10-29 MED ORDER — CEFAZOLIN SODIUM-DEXTROSE 2-3 GM-% IV SOLR
INTRAVENOUS | Status: AC
  Filled 2014-10-29: qty 50

## 2014-10-29 MED ORDER — LACTATED RINGERS IV SOLN
INTRAVENOUS | Status: DC
  Administered 2014-10-29 (×2): via INTRAVENOUS

## 2014-10-29 MED ORDER — HEPARIN SOD (PORK) LOCK FLUSH 100 UNIT/ML IV SOLN
INTRAVENOUS | Status: DC | PRN
  Administered 2014-10-29: 500 [IU]

## 2014-10-29 MED ORDER — ONDANSETRON HCL 4 MG/2ML IJ SOLN
INTRAMUSCULAR | Status: DC | PRN
  Administered 2014-10-29: 4 mg via INTRAVENOUS

## 2014-10-29 MED ORDER — LIDOCAINE HCL (CARDIAC) 20 MG/ML IV SOLN
INTRAVENOUS | Status: DC | PRN
  Administered 2014-10-29: 50 mg via INTRAVENOUS

## 2014-10-29 MED ORDER — FENTANYL CITRATE (PF) 100 MCG/2ML IJ SOLN
INTRAMUSCULAR | Status: AC
  Filled 2014-10-29: qty 6

## 2014-10-29 MED ORDER — OXYCODONE-ACETAMINOPHEN 5-325 MG PO TABS: 1.0000 | ORAL_TABLET | ORAL | Status: DC | PRN

## 2014-10-29 MED ORDER — GLYCOPYRROLATE 0.2 MG/ML IJ SOLN: 0.2000 mg | Freq: Once | INTRAMUSCULAR | Status: DC | PRN

## 2014-10-29 MED ORDER — FENTANYL CITRATE (PF) 100 MCG/2ML IJ SOLN
INTRAMUSCULAR | Status: DC | PRN
  Administered 2014-10-29: 50 ug via INTRAVENOUS

## 2014-10-29 MED ORDER — PROMETHAZINE HCL 12.5 MG PO TABS: 12.5000 mg | ORAL_TABLET | Freq: Four times a day (QID) | ORAL | Status: DC | PRN

## 2014-10-29 SURGICAL SUPPLY — 64 items
APL SKNCLS STERI-STRIP NONHPOA (GAUZE/BANDAGES/DRESSINGS)
BAG DECANTER FOR FLEXI CONT (MISCELLANEOUS) ×2 IMPLANT
BENZOIN TINCTURE PRP APPL 2/3 (GAUZE/BANDAGES/DRESSINGS) IMPLANT
BLADE HEX COATED 2.75 (ELECTRODE) ×2 IMPLANT
BLADE SURG 11 STRL SS (BLADE) ×2 IMPLANT
BLADE SURG 15 STRL LF DISP TIS (BLADE) ×1 IMPLANT
BLADE SURG 15 STRL SS (BLADE) ×2
CANISTER SUCT 1200ML W/VALVE (MISCELLANEOUS) IMPLANT
CHLORAPREP W/TINT 26ML (MISCELLANEOUS) ×2 IMPLANT
COVER BACK TABLE 60X90IN (DRAPES) ×2 IMPLANT
COVER MAYO STAND STRL (DRAPES) ×2 IMPLANT
COVER PROBE 5X48 (MISCELLANEOUS) ×2
DECANTER SPIKE VIAL GLASS SM (MISCELLANEOUS) IMPLANT
DRAPE C-ARM 42X72 X-RAY (DRAPES) ×2 IMPLANT
DRAPE LAPAROSCOPIC ABDOMINAL (DRAPES) ×2 IMPLANT
DRAPE UTILITY XL STRL (DRAPES) ×2 IMPLANT
DRSG TEGADERM 2-3/8X2-3/4 SM (GAUZE/BANDAGES/DRESSINGS) IMPLANT
ELECT REM PT RETURN 9FT ADLT (ELECTROSURGICAL) ×2
ELECTRODE REM PT RTRN 9FT ADLT (ELECTROSURGICAL) ×1 IMPLANT
GEL ULTRASOUND 8.5O AQUASONIC (MISCELLANEOUS) ×2 IMPLANT
GLOVE BIO SURGEON STRL SZ 6.5 (GLOVE) ×1 IMPLANT
GLOVE BIO SURGEON STRL SZ7 (GLOVE) ×1 IMPLANT
GLOVE BIOGEL PI IND STRL 7.0 (GLOVE) IMPLANT
GLOVE BIOGEL PI IND STRL 7.5 (GLOVE) IMPLANT
GLOVE BIOGEL PI IND STRL 8 (GLOVE) ×1 IMPLANT
GLOVE BIOGEL PI INDICATOR 7.0 (GLOVE) ×1
GLOVE BIOGEL PI INDICATOR 7.5 (GLOVE) ×1
GLOVE BIOGEL PI INDICATOR 8 (GLOVE) ×1
GLOVE ECLIPSE 8.0 STRL XLNG CF (GLOVE) ×2 IMPLANT
GLOVE EXAM NITRILE MD LF STRL (GLOVE) ×1 IMPLANT
GOWN STRL REUS W/ TWL LRG LVL3 (GOWN DISPOSABLE) ×2 IMPLANT
GOWN STRL REUS W/TWL LRG LVL3 (GOWN DISPOSABLE) ×6
IV KIT MINILOC 20X1 SAFETY (NEEDLE) IMPLANT
KIT CVR 48X5XPRB PLUP LF (MISCELLANEOUS) ×1 IMPLANT
KIT PORT POWER 8FR ISP CVUE (Catheter) ×1 IMPLANT
LIQUID BAND (GAUZE/BANDAGES/DRESSINGS) ×2 IMPLANT
NDL HYPO 25X1 1.5 SAFETY (NEEDLE) ×1 IMPLANT
NDL SAFETY ECLIPSE 18X1.5 (NEEDLE) IMPLANT
NDL SPNL 22GX3.5 QUINCKE BK (NEEDLE) IMPLANT
NEEDLE HYPO 18GX1.5 SHARP (NEEDLE)
NEEDLE HYPO 22GX1.5 SAFETY (NEEDLE) IMPLANT
NEEDLE HYPO 25X1 1.5 SAFETY (NEEDLE) ×2 IMPLANT
NEEDLE SPNL 22GX3.5 QUINCKE BK (NEEDLE) IMPLANT
PACK BASIN DAY SURGERY FS (CUSTOM PROCEDURE TRAY) ×2 IMPLANT
PENCIL BUTTON HOLSTER BLD 10FT (ELECTRODE) ×2 IMPLANT
SET SHEATH INTRODUCER 10FR (MISCELLANEOUS) IMPLANT
SHEATH COOK PEEL AWAY SET 9F (SHEATH) IMPLANT
SLEEVE SCD COMPRESS KNEE MED (MISCELLANEOUS) ×1 IMPLANT
SPONGE GAUZE 4X4 12PLY STER LF (GAUZE/BANDAGES/DRESSINGS) IMPLANT
SPONGE LAP 4X18 X RAY DECT (DISPOSABLE) ×1 IMPLANT
STRIP CLOSURE SKIN 1/2X4 (GAUZE/BANDAGES/DRESSINGS) IMPLANT
SUT MON AB 4-0 PC3 18 (SUTURE) ×2 IMPLANT
SUT PROLENE 2 0 CT2 30 (SUTURE) IMPLANT
SUT PROLENE 2 0 SH DA (SUTURE) ×2 IMPLANT
SUT SILK 2 0 TIES 17X18 (SUTURE)
SUT SILK 2-0 18XBRD TIE BLK (SUTURE) IMPLANT
SUT VIC AB 3-0 SH 27 (SUTURE) ×2
SUT VIC AB 3-0 SH 27X BRD (SUTURE) ×1 IMPLANT
SYR 5ML LUER SLIP (SYRINGE) ×2 IMPLANT
SYR CONTROL 10ML LL (SYRINGE) ×2 IMPLANT
TOWEL OR 17X24 6PK STRL BLUE (TOWEL DISPOSABLE) ×4 IMPLANT
TOWEL OR NON WOVEN STRL DISP B (DISPOSABLE) ×2 IMPLANT
TUBE CONNECTING 20X1/4 (TUBING) IMPLANT
YANKAUER SUCT BULB TIP NO VENT (SUCTIONS) IMPLANT

## 2014-10-29 NOTE — Anesthesia Postprocedure Evaluation (Signed)
Anesthesia Post Note  Patient: [name] [name]  Procedure(s) Performed: Procedure(s) (LRB): INSERTION PORT-A-CATH WITH ULTRASOUND (Right)  Anesthesia type: General  Patient location: PACU  Post pain: Pain level controlled  Post assessment: Post-op Vital signs reviewed  Last Vitals: BP 101/63 mmHg  Pulse 61  Temp(Src) 36.6 C (Oral)  Resp 18  Ht 5' (1.524 m)  Wt 110 lb 6 oz (50.066 kg)  BMI 21.56 kg/m2  SpO2 100%  Post vital signs: Reviewed  Level of consciousness: sedated  Complications: No apparent anesthesia complications

## 2014-10-29 NOTE — Interval H&P Note (Signed)
History and Physical Interval Note:  10/29/2014 10:27 AM  [name] [name]  has presented today for surgery, with the diagnosis of left breast cancer  The various methods of treatment have been discussed with the patient and family. After consideration of risks, benefits and other options for treatment, the patient has consented to  Procedure(s): INSERTION PORT-A-CATH WITH ULTRASOUND (N/A) as a surgical intervention .  The patient's history has been reviewed, patient examined, no change in status, stable for surgery.  I have reviewed the patient's chart and labs.  Questions were answered to the patient's satisfaction.     Suvi Archuletta A.

## 2014-10-29 NOTE — Discharge Instructions (Signed)
Implanted Port Home Guide °An implanted port is a type of central line that is placed under the skin. Central lines are used to provide IV access when treatment or nutrition needs to be given through a person's veins. Implanted ports are used for long-term IV access. An implanted port may be placed because:  °· You need IV medicine that would be irritating to the small veins in your hands or arms.   °· You need long-term IV medicines, such as antibiotics.   °· You need IV nutrition for a long period.   °· You need frequent blood draws for lab tests.   °· You need dialysis.   °Implanted ports are usually placed in the chest area, but they can also be placed in the upper arm, the abdomen, or the leg. An implanted port has two main parts:  °· Reservoir. The reservoir is round and will appear as a small, raised area under your skin. The reservoir is the part where a needle is inserted to give medicines or draw blood.   °· Catheter. The catheter is a thin, flexible tube that extends from the reservoir. The catheter is placed into a large vein. Medicine that is inserted into the reservoir goes into the catheter and then into the vein.   °HOW WILL I CARE FOR MY INCISION SITE? °Do not get the incision site wet. Bathe or shower as directed by your health care provider.  °HOW IS MY PORT ACCESSED? °Special steps must be taken to access the port:  °· Before the port is accessed, a numbing cream can be placed on the skin. This helps numb the skin over the port site.   °· Your health care provider uses a sterile technique to access the port. °· Your health care provider must put on a mask and sterile gloves. °· The skin over your port is cleaned carefully with an antiseptic and allowed to dry. °· The port is gently pinched between sterile gloves, and a needle is inserted into the port. °· Only "non-coring" port needles should be used to access the port. Once the port is accessed, a blood return should be checked. This helps  ensure that the port is in the vein and is not clogged.   °· If your port needs to remain accessed for a constant infusion, a clear (transparent) bandage will be placed over the needle site. The bandage and needle will need to be changed every week, or as directed by your health care provider.   °· Keep the bandage covering the needle clean and dry. Do not get it wet. Follow your health care provider's instructions on how to take a shower or bath while the port is accessed.   °· If your port does not need to stay accessed, no bandage is needed over the port.   °WHAT IS FLUSHING? °Flushing helps keep the port from getting clogged. Follow your health care provider's instructions on how and when to flush the port. Ports are usually flushed with saline solution or a medicine called heparin. The need for flushing will depend on how the port is used.  °· If the port is used for intermittent medicines or blood draws, the port will need to be flushed:   °· After medicines have been given.   °· After blood has been drawn.   °· As part of routine maintenance.   °· If a constant infusion is running, the port may not need to be flushed.   °HOW LONG WILL MY PORT STAY IMPLANTED? °The port can stay in for as long as your health care   provider thinks it is needed. When it is time for the port to come out, surgery will be done to remove it. The procedure is similar to the one performed when the port was put in.  WHEN SHOULD I SEEK IMMEDIATE MEDICAL CARE? When you have an implanted port, you should seek immediate medical care if:   You notice a bad smell coming from the incision site.   You have swelling, redness, or drainage at the incision site.   You have more swelling or pain at the port site or the surrounding area.   You have a fever that is not controlled with medicine. Document Released: 06/14/2005 Document Revised: 04/04/2013 Document Reviewed: 02/19/2013 [facility] Patient Information 2015 [place], [place]. This  information is not intended to replace advice given to you by your health care provider. Make sure you discuss any questions you have with your health care provider.  Post Anesthesia Home Care Instructions  Activity: Get plenty of rest for the remainder of the day. A responsible adult should stay with you for 24 hours following the procedure.  For the next 24 hours, DO NOT: -Drive a car -Paediatric nurse -Drink alcoholic beverages -Take any medication unless instructed by your physician -Make any legal decisions or sign important papers.  Meals: Start with liquid foods such as gelatin or soup. Progress to regular foods as tolerated. Avoid greasy, spicy, heavy foods. If nausea and/or vomiting occur, drink only clear liquids until the nausea and/or vomiting subsides. Call your physician if vomiting continues.  Special Instructions/Symptoms: Your throat may feel dry or sore from the anesthesia or the breathing tube placed in your throat during surgery. If this causes discomfort, gargle with warm salt water. The discomfort should disappear within 24 hours.  If you had a scopolamine patch placed behind your ear for the management of post- operative nausea and/or vomiting:  1. The medication in the patch is effective for 72 hours, after which it should be removed.  Wrap patch in a tissue and discard in the trash. Wash hands thoroughly with soap and water. 2. You may remove the patch earlier than 72 hours if you experience unpleasant side effects which may include dry mouth, dizziness or visual disturbances. 3. Avoid touching the patch. Wash your hands with soap and water after contact with the patch.

## 2014-10-29 NOTE — H&P (View-Only) (Signed)
[name] [name] 10/16/2014 7:49 AM Location: [facility] Patient #: [id number] DOB: 10/05/1960 Undefined / Language: Suszanne Conners / Race: Undefined Female History of Present Illness Marcello Moores A. Yurani Fettes MD; 10/16/2014 3:55 PM) Patient words: pt presents to the [facility] for left breast cancer. Sent at the request of Dr Dwana Curd.    1.7 cm mass left breast 2 oclock with positive LN bx.  no other complaints.  The patient is a 54 year old female who presents with breast cancer. 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 Tumor markers include HER -2/neu positive. Disease involvement includes ipsilateral axillary lymph nodes. No associated conditions are noted. The patient was referred by a specialty consultant. Initial presentation was 1 month(s) ago. Other Problems Anderson Malta [place], [place]; 10/16/2014 7:49 AM) Anxiety Disorder Asthma Bladder Problems Breast Cancer General anesthesia - complications Hemorrhoids Lump In Breast Migraine Headache  Past Surgical History Anderson Malta [place], RMA; 10/16/2014 7:49 AM) Breast Biopsy Bilateral. multiple Colon Polyp Removal - Colonoscopy Oral Surgery  Diagnostic Studies History Anderson Malta [place], RMA; 10/16/2014 7:49 AM) Colonoscopy 1-5 years ago Mammogram within last year Pap Smear 1-5 years ago  Social History Anderson Malta [place], RMA; 10/16/2014 7:49 AM) Alcohol use Occasional alcohol use. Caffeine use Coffee, Tea. No drug use Tobacco use Never smoker.  Family History Anderson Malta [place], [place]; 10/16/2014 7:49 AM) Alcohol Abuse Family Members In General. Arthritis Father. Breast Cancer Family Members In General. Cancer Mother. Colon Cancer Brother, Father. Colon Polyps Brother, Father. Diabetes Mellitus Family Members In General. Heart Disease Family Members In General. Hypertension Family Members In General. Melanoma Brother. Respiratory Condition Family  Members In General. Thyroid problems Father.  Pregnancy / Birth History Anderson Malta [place], [place]; 10/16/2014 7:50 AM) Age at menarche 62 years. Contraceptive History Oral contraceptives. Gravida 3 Maternal age 63-30 Para 3 Regular periods     Review of Systems Anderson Malta [place] RMA; 10/16/2014 7:50 AM) General Present- Fatigue and Night Sweats. Not Present- Appetite Loss, Chills, Fever, Weight Gain and Weight Loss. Skin Present- Change in Wart/Mole and Rash. Not Present- Dryness, Hives, Jaundice, New Lesions, Non-Healing Wounds and Ulcer. HEENT Present- Wears glasses/contact lenses. Not Present- Earache, Hearing Loss, Hoarseness, Nose Bleed, Oral Ulcers, Ringing in the Ears, Seasonal Allergies, Sinus Pain, Sore Throat, Visual Disturbances and Yellow Eyes. Respiratory Not Present- Bloody sputum, Chronic Cough, Difficulty Breathing, Snoring and Wheezing. Breast Present- Breast Mass, Breast Pain and Skin Changes. Not Present- Nipple Discharge. Cardiovascular Not Present- Chest Pain, Difficulty Breathing Lying Down, Leg Cramps, Palpitations, Rapid Heart Rate, Shortness of Breath and Swelling of Extremities. Gastrointestinal Not Present- Abdominal Pain, Bloating, Bloody Stool, Change in Bowel Habits, Chronic diarrhea, Constipation, Difficulty Swallowing, Excessive gas, Gets full quickly at meals, Hemorrhoids, Indigestion, Nausea, Rectal Pain and Vomiting. Female Genitourinary Not Present- Frequency, Nocturia, Painful Urination, Pelvic Pain and Urgency. Musculoskeletal Not Present- Back Pain, Joint Pain, Joint Stiffness, Muscle Pain, Muscle Weakness and Swelling of Extremities. Neurological Not Present- Decreased Memory, Fainting, Headaches, Numbness, Seizures, Tingling, Tremor, Trouble walking and Weakness. Psychiatric Not Present- Anxiety, Bipolar, Change in Sleep Pattern, Depression, Fearful and Frequent crying. Hematology Not Present- Easy Bruising, Excessive bleeding, Gland problems, HIV and  Persistent Infections.   Physical Exam (Cheresa Siers A. Tahlor Berenguer MD; 10/16/2014 3:57 PM)  Chest and Lung Exam Chest and lung exam reveals -quiet, even and easy respiratory effort with no use of accessory muscles and on auscultation, normal breath sounds, no adventitious sounds and normal vocal resonance. Inspection Chest Wall - Normal. Back -  normal.  Breast Note: moblie 1 cm left breast mass left axilla LN palpable  right breast normal  B cup size   Cardiovascular Cardiovascular examination reveals -on palpation PMI is normal in location and amplitude, no palpable S3 or S4. Normal cardiac borders., normal heart sounds, regular rate and rhythm with no murmurs, carotid auscultation reveals no bruits and normal pedal pulses bilaterally.  Neurologic Neurologic evaluation reveals -alert and oriented x 3 with no impairment of recent or remote memory. Mental Status-Normal.  Musculoskeletal Normal Exam - Left-Upper Extremity Strength Normal and Lower Extremity Strength Normal. Normal Exam - Right-Upper Extremity Strength Normal, Lower Extremity Weakness.  Lymphatic Axillary -Note:left axillary LN palpable .  right axilla normal.     Assessment & Plan (Evelise Reine A. Janiel Derhammer MD; 10/16/2014 3:59 PM)  BREAST CANCER, STAGE 2, LEFT (174.9  C50.912) Impression: given positive node, chemotherapy recommended and patient desires bilateral mastectomy and reconstruction. good candidate for breast conservation as well. Good candidate for NSM as well. Will set up pot. Risk bleeding, infection, PTX hemothorax, migration malfuction, embolization, and the need for mor surgery. She agrees to proceed.

## 2014-10-29 NOTE — Op Note (Signed)
[name] [name] 1960/10/23 [id number] 10/29/2014   Preoperative diagnosis: PAC needed  Postoperative diagnosis: Same  Procedure: Portacath Placement right internal jugular with ultrasound and fluoroscopy   Surgeon: Erroll Luna, MD, FACS  Anesthesia: General and local   Clinical History and Indications: The patient is getting ready to begin chemotherapy for her cancer. She  needs a Port-A-Cath for venous access.  Description of Procedure: I have seen the patient in the holding area and confirmed the plans for the procedure as noted above. I reviewed the risks and complications again and the patient has no further questions. She wishes to proceed. The procedure has been discussed with the patient.  Alternative therapies have been discussed with the patient.  Operative risks include bleeding,  Infection,  Organ injury,  Pneumothorax, hemothorax,  Treatment with chest tube ,  Nerve injury,  Blood vessel injury,  DVT,  Pulmonary embolism,  Death,  And possible reoperation.  Medical management risks include worsening of present situation.   The patient understands and agrees to proceed.   The patient was then taken to the operating room. After satisfactory LMA  anesthesia had been obtained the upper chest and lower neck were prepped and draped as a sterile field. The timeout was done.  The right internal vein was entered with ultrasound guidance  and the guidewire threaded into the superior vena cava right atrial area under fluoroscopic guidance. An incision was then made on the anterior chest wall and neck  and a subcutaneous pocket fashioned for the port reservoir. Pt in head down position.   The port tubing was then brought through a subcutaneous tunnel from the port site to the guidewire site. The dilator and peel-away sheath were then advanced over the guidewire while monitoring this with fluoroscopy. The guidewire and dilator were removed and the tubing threaded to approximately 21 cm. The  peel-away sheath was then removed. The catheter aspirated and flushed easily. Using fluoroscopy the tip was backed out into the superior vena cava right atrial junction area. It aspirated and flushed easily. The reservoir was attached and the locking mechanism engaged. That aspirated and flushed easily.  The reservoir was secured to the fascia with 2 sutures of 2-0 Prolene. A final check with fluoroscopy was done to make sure we had no kinks and good positioning of the tip of the catheter. Everything appeared to be okay. The catheter was aspirated, flushed with dilute heparin and then concentrated aqueous heparin.  The incision was then closed with interrupted 3-0 Vicryl, and 4-0 Monocryl subcuticular with Dermabond on the skin.  There were no operative complications. Estimated blood loss was minimal. All counts were correct. The patient tolerated the procedure well.  Erroll Luna, MD, FACS 10/29/2014 11:45 AM

## 2014-10-29 NOTE — Anesthesia Preprocedure Evaluation (Signed)
Anesthesia Evaluation  Patient identified by MRN, date of birth, ID band Patient awake    Reviewed: Allergy & Precautions, NPO status , Patient's Chart, lab work & pertinent test results  Airway Mallampati: II  TM Distance: >3 FB Neck ROM: Full    Dental no notable dental hx.    Pulmonary neg pulmonary ROS,  breath sounds clear to auscultation  Pulmonary exam normal       Cardiovascular negative cardio ROS  Rhythm:Regular Rate:Normal     Neuro/Psych  Headaches, negative psych ROS   GI/Hepatic negative GI ROS, Neg liver ROS,   Endo/Other  negative endocrine ROS  Renal/GU negative Renal ROS     Musculoskeletal negative musculoskeletal ROS (+)   Abdominal   Peds  Hematology negative hematology ROS (+)   Anesthesia Other Findings   Reproductive/Obstetrics negative OB ROS                             Anesthesia Physical Anesthesia Plan  ASA: II  Anesthesia Plan: General   Post-op Pain Management:    Induction: Intravenous  Airway Management Planned: LMA  Additional Equipment: None  Intra-op Plan:   Post-operative Plan: Extubation in OR  Informed Consent: I have reviewed the patients History and Physical, chart, labs and discussed the procedure including the risks, benefits and alternatives for the proposed anesthesia with the patient or authorized representative who has indicated his/her understanding and acceptance.   Dental advisory given  Plan Discussed with: CRNA  Anesthesia Plan Comments:         Anesthesia Quick Evaluation

## 2014-10-29 NOTE — Anesthesia Procedure Notes (Signed)
Procedure Name: LMA Insertion Date/Time: 10/29/2014 10:59 AM Performed by: Toula Moos L Pre-anesthesia Checklist: Patient identified, Emergency Drugs available, Suction available, Patient being monitored and Timeout performed Patient Re-evaluated:Patient Re-evaluated prior to inductionOxygen Delivery Method: Circle System Utilized Preoxygenation: Pre-oxygenation with 100% oxygen Intubation Type: IV induction Ventilation: Mask ventilation without difficulty LMA: LMA inserted LMA Size: 3.0 Number of attempts: 1 Airway Equipment and Method: Bite block Placement Confirmation: positive ETCO2 Tube secured with: Tape Dental Injury: Teeth and Oropharynx as per pre-operative assessment

## 2014-10-29 NOTE — Transfer of Care (Signed)
Immediate Anesthesia Transfer of Care Note  Patient: [name] [name]  Procedure(s) Performed: Procedure(s): INSERTION PORT-A-CATH WITH ULTRASOUND (Right)  Patient Location: PACU  Anesthesia Type:General  Level of Consciousness: awake and patient cooperative  Airway & Oxygen Therapy: Patient Spontanous Breathing and Patient connected to face mask oxygen  Post-op Assessment: Report given to RN and Post -op Vital signs reviewed and stable  Post vital signs: Reviewed and stable  Last Vitals:  Filed Vitals:   10/29/14 0948  BP: 116/50  Pulse: 58  Temp: 36.7 C  Resp: 16    Complications: No apparent anesthesia complications

## 2014-10-30 ENCOUNTER — Ambulatory Visit (HOSPITAL_COMMUNITY)
Admission: RE | Admit: 2014-10-30 | Discharge: 2014-10-30 | Disposition: A | Discharge status: Home / Self Care | Payer: PRIVATE HEALTH INSURANCE | Source: Ambulatory Visit | Attending: Hematology and Oncology | Admitting: Hematology and Oncology

## 2014-10-30 ENCOUNTER — Encounter (HOSPITAL_COMMUNITY): Payer: Self-pay

## 2014-10-30 ENCOUNTER — Encounter (HOSPITAL_COMMUNITY)
Admission: RE | Admit: 2014-10-30 | Discharge: 2014-10-30 | Disposition: A | Discharge status: Home / Self Care | Payer: PRIVATE HEALTH INSURANCE | Source: Ambulatory Visit | Attending: Hematology and Oncology | Admitting: Hematology and Oncology

## 2014-10-30 DIAGNOSIS — I862 Pelvic varices: Secondary | ICD-10-CM | POA: Insufficient documentation

## 2014-10-30 DIAGNOSIS — C773 Secondary and unspecified malignant neoplasm of axilla and upper limb lymph nodes: Secondary | ICD-10-CM | POA: Diagnosis not present

## 2014-10-30 DIAGNOSIS — C50412 Malignant neoplasm of upper-outer quadrant of left female breast: Secondary | ICD-10-CM

## 2014-10-30 DIAGNOSIS — C779 Secondary and unspecified malignant neoplasm of lymph node, unspecified: Secondary | ICD-10-CM | POA: Insufficient documentation

## 2014-10-30 DIAGNOSIS — C50912 Malignant neoplasm of unspecified site of left female breast: Secondary | ICD-10-CM | POA: Insufficient documentation

## 2014-10-30 MED ORDER — IOHEXOL 300 MG/ML  SOLN
50.0000 mL | Freq: Once | INTRAMUSCULAR | Status: AC | PRN
  Administered 2014-10-30: 50 mL via ORAL

## 2014-10-30 MED ORDER — TECHNETIUM TC 99M MEDRONATE IV KIT
26.9000 | PACK | Freq: Once | INTRAVENOUS | Status: AC | PRN
  Administered 2014-10-30: 26.9 via INTRAVENOUS

## 2014-10-30 MED ORDER — IOHEXOL 300 MG/ML  SOLN
100.0000 mL | Freq: Once | INTRAMUSCULAR | Status: AC | PRN
  Administered 2014-10-30: 100 mL via INTRAVENOUS

## 2014-10-30 NOTE — Assessment & Plan Note (Signed)
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:00 position, left axillary lymph node palpable, ER/PR negative, 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 5.32, T1 cN1 M0 stage II a clinical stage  Treatment Plan: Neoadjuvant chemotherapy with Taxotere, carboplatin, Herceptin and Perjeta every 3 weeks 6 cycles to start 11/01/2014  CT CAP and Bone Scan: 10/30/14 Numerous enlarged left axillary and subpectoral lymph nodes consistent with nodal metastases from the patient's left breast cancer.  ECHO 10/21/14: EF 60-65%  Chemo Monitoring: 1.EF 60-65% 2. Anti emetics were reviewed    

## 2014-10-31 ENCOUNTER — Ambulatory Visit (HOSPITAL_BASED_OUTPATIENT_CLINIC_OR_DEPARTMENT_OTHER): Payer: PRIVATE HEALTH INSURANCE | Admitting: Hematology and Oncology

## 2014-10-31 ENCOUNTER — Other Ambulatory Visit: Payer: Self-pay | Admitting: Hematology and Oncology

## 2014-10-31 ENCOUNTER — Encounter (HOSPITAL_BASED_OUTPATIENT_CLINIC_OR_DEPARTMENT_OTHER): Payer: Self-pay | Admitting: Surgery

## 2014-10-31 ENCOUNTER — Telehealth: Payer: Self-pay | Admitting: Hematology and Oncology

## 2014-10-31 VITALS — BP 132/82 | HR 61 | Temp 97.6°F | Resp 18 | Ht 60.0 in | Wt 112.9 lb

## 2014-10-31 DIAGNOSIS — Z171 Estrogen receptor negative status [ER-]: Secondary | ICD-10-CM

## 2014-10-31 DIAGNOSIS — C773 Secondary and unspecified malignant neoplasm of axilla and upper limb lymph nodes: Secondary | ICD-10-CM

## 2014-10-31 DIAGNOSIS — C50412 Malignant neoplasm of upper-outer quadrant of left female breast: Secondary | ICD-10-CM

## 2014-10-31 MED ORDER — ELETRIPTAN HYDROBROMIDE 40 MG PO TABS: 40.0000 mg | ORAL_TABLET | ORAL | Status: AC | PRN

## 2014-10-31 NOTE — Progress Notes (Signed)
Pt had labs 10/28/14.  Per Dr. Lindi Adie, additional labs not needed prior to chemo on 5/6.

## 2014-10-31 NOTE — Progress Notes (Signed)
Patient Care Team: Laray Anger, MD as PCP - General (Obstetrics and Gynecology) Erroll Luna, MD as Consulting Physician (General Surgery) Nicholas Lose, MD as Consulting Physician (Hematology and Oncology) Gery Pray, MD as Consulting Physician (Radiation Oncology) Mauro Kaufmann, RN as Registered Nurse Rockwell Germany, RN as Registered Nurse  DIAGNOSIS: Breast cancer of upper-outer quadrant of left female breast   Staging form: Breast, AJCC 7th Edition     Clinical stage from 10/16/2014: Stage IIA (T1c, N1, M0) - Unsigned   SUMMARY OF ONCOLOGIC HISTORY:   Breast cancer of upper-outer quadrant of left female breast   10/01/2014 Mammogram Left breast mammogram and ultrasound for palpable left breast mass which was a maxillary mass with an ill-defined abnormality by ultrasound 1.7 cm at 2:00   10/11/2014 Initial Diagnosis Left breast invasive ductal carcinoma, grade 3, ER/PR negative, 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 5.32, axillary lymph node also positive for cancer   10/30/2014 Imaging CT chest abdomen pelvis and bone scan revealed no evidence of distant metastatic disease but enlarged left axillary and left subpectoral lymph nodes    CHIEF COMPLIANT: Follow-up to discuss scans  INTERVAL HISTORY: [name] [name] is a 54 year old with above-mentioned history of left breast cancer that is HER-2 positive with enlarged axillary lymph nodes that is HER-2 positive ER/PR negative. She had CT chest abdomen pelvis and bone scan and is here today to discuss the results. She has profound migraine headache. And she is out of her migraine medications. She underwent port placement and it is healing very well.  REVIEW OF SYSTEMS:   Constitutional: Denies fevers, chills or abnormal weight loss Eyes: Denies blurriness of vision Ears, nose, mouth, throat, and face: Denies mucositis or sore throat Respiratory: Denies cough, dyspnea or wheezes Cardiovascular: Denies palpitation, chest discomfort or lower extremity  swelling Gastrointestinal:  Denies nausea, heartburn or change in bowel habits Skin: Denies abnormal skin rashes Lymphatics: Denies new lymphadenopathy or easy bruising Neurological: Complains of migraine Behavioral/Psych: Mood is stable, no new changes  All other systems were reviewed with the patient and are negative.  I have reviewed the past medical history, past surgical history, social history and family history with the patient and they are unchanged from previous note.  ALLERGIES:  is allergic to butorphanol; meperidine; and sulfa antibiotics.  MEDICATIONS:  Current Outpatient Prescriptions  Medication Sig Dispense Refill  . acetaminophen (TYLENOL) 325 MG tablet Take 650 mg by mouth every 6 (six) hours as needed.    . Aspirin-Acetaminophen-Caffeine (GOODYS EXTRA STRENGTH PO) Take by mouth.    Marland Kitchen atenolol-chlorthalidone (TENORETIC) 50-25 MG per tablet Take by mouth.    . B Complex-Biotin-FA (B-COMPLEX PO) Take by mouth.    Marland Kitchen BLACK COHOSH PO Take by mouth.    . Calcium Carbonate (CALCIUM 600 PO) Take by mouth.    . dexamethasone (DECADRON) 4 MG tablet Take 1 tablet (4 mg total) by mouth 2 (two) times daily. Start the day before Taxotere. Then again the day after chemo for 3 days. 30 tablet 1  . eletriptan (RELPAX) 40 MG tablet Take 1 tablet (40 mg total) by mouth every 2 (two) hours as needed for migraine or headache (Max 2 per day). 10 tablet 3  . EVENING PRIMROSE OIL PO Take 1,300 mg by mouth.    Marland Kitchen ibuprofen (ADVIL,MOTRIN) 100 MG tablet Take 100 mg by mouth every 6 (six) hours as needed for fever.    . Lactobacillus (ACIDOPHILUS PO) Take by mouth.    . lidocaine-prilocaine (EMLA) cream Apply  to affected area once 30 g 3  . LORazepam (ATIVAN) 0.5 MG tablet Take 1 tablet (0.5 mg total) by mouth every 6 (six) hours as needed (Nausea or vomiting). 30 tablet 0  . ondansetron (ZOFRAN) 8 MG tablet Take 1 tablet (8 mg total) by mouth 2 (two) times daily. Start the day after chemo for 3  days. Then take as needed for nausea or vomiting. 30 tablet 1  . oxyCODONE-acetaminophen (ROXICET) 5-325 MG per tablet Take 1 tablet by mouth every 4 (four) hours as needed. 30 tablet 0  . PARoxetine (PAXIL) 20 MG tablet Take 20 mg by mouth.    . prochlorperazine (COMPAZINE) 10 MG tablet Take 1 tablet (10 mg total) by mouth every 6 (six) hours as needed (Nausea or vomiting). 30 tablet 1  . promethazine (PHENERGAN) 12.5 MG tablet Take 1 tablet (12.5 mg total) by mouth every 6 (six) hours as needed for nausea or vomiting. 30 tablet 0  . solifenacin (VESICARE) 5 MG tablet Take 10 mg by mouth.    Marland Kitchen VITAMIN D, CHOLECALCIFEROL, PO Take by mouth.     No current facility-administered medications for this visit.    PHYSICAL EXAMINATION: ECOG PERFORMANCE STATUS: 1 - Symptomatic but completely ambulatory  Filed Vitals:   10/31/14 0850  BP: 132/82  Pulse: 61  Temp: 97.6 F (36.4 C)  Resp: 18   Filed Weights   10/31/14 0850  Weight: 112 lb 14.4 oz (51.211 kg)    GENERAL:alert, no distress and comfortable SKIN: skin color, texture, turgor are normal, no rashes or significant lesions EYES: normal, Conjunctiva are pink and non-injected, sclera clear OROPHARYNX:no exudate, no erythema and lips, buccal mucosa, and tongue normal  NECK: supple, thyroid normal size, non-tender, without nodularity LYMPH:  no palpable lymphadenopathy in the cervical, axillary or inguinal LUNGS: clear to auscultation and percussion with normal breathing effort HEART: regular rate & rhythm and no murmurs and no lower extremity edema ABDOMEN:abdomen soft, non-tender and normal bowel sounds Musculoskeletal:no cyanosis of digits and no clubbing  NEURO: alert & oriented x 3 with fluent speech, no focal motor/sensory deficits  LABORATORY DATA:  I have reviewed the data as listed   Chemistry      Component Value Date/Time   NA 140 10/28/2014 1115   K 4.3 10/28/2014 1115   CO2 25 10/28/2014 1115   BUN 11.0 10/28/2014  1115   CREATININE 0.7 10/28/2014 1115      Component Value Date/Time   CALCIUM 9.3 10/28/2014 1115   ALKPHOS 69 10/28/2014 1115   AST 29 10/28/2014 1115   ALT 25 10/28/2014 1115   BILITOT 0.60 10/28/2014 1115       Lab Results  Component Value Date   WBC 7.5 10/28/2014   HGB 13.8 10/28/2014   HCT 41.6 10/28/2014   MCV 89.4 10/28/2014   PLT 269 10/28/2014   NEUTROABS 4.6 10/28/2014     RADIOGRAPHIC STUDIES: I have personally reviewed the radiology reports and agreed with their findings. Ct Chest W Contrast  10/30/2014   CLINICAL DATA:  New diagnosis of left breast cancer. Staging. Initial encounter. Next  EXAM: CT CHEST, ABDOMEN, AND PELVIS WITH CONTRAST  TECHNIQUE: Multidetector CT imaging of the chest, abdomen and pelvis was performed following the standard protocol during bolus administration of intravenous contrast.  CONTRAST:  13m OMNIPAQUE IOHEXOL 300 MG/ML  SOLN  COMPARISON:  Breast MRI 10/22/2014.  FINDINGS: CT CHEST FINDINGS  Mediastinum/Nodes: There are no enlarged supraclavicular, internal mammary, mediastinal or hilar lymph nodes.  Multiple enlarged left axillary and subpectoral lymph nodes are noted including an 11 mm node on image number 10, a 13 mm node on image 15 and a 15 mm node on image 24. The thyroid gland, trachea and esophagus demonstrate no significant findings. The heart size is normal. There is no pericardial effusion.There is soft tissue emphysema on the lower right neck attributed to recent Port-A-Cath placement. Catheter tip is at the SVC right atrial junction.  Lungs/Pleura: There is no pleural effusion.The lungs are clear.  Musculoskeletal/Chest wall: Asymmetric soft tissue mass laterally in the left breast adjacent to biopsy clip measures 13 mm on image 27. There is a small asymmetric nodular density in the upper inner quadrant of the left breast which may correspond with the lesion seen on MRI. There is a biopsy clip laterally in the right breast. No  worrisome osseous findings.  CT ABDOMEN AND PELVIS FINDINGS  Hepatobiliary: Adjacent cysts in the right hepatic lobe measure up to 1.4 cm on image 54. No suspicious liver lesions. No evidence of gallstones, gallbladder wall thickening or biliary dilatation.  Pancreas: Unremarkable. No pancreatic ductal dilatation or surrounding inflammatory changes.  Spleen: Normal in size without focal abnormality.  Adrenals/Urinary Tract: Both adrenal glands appear normal.The kidneys appear normal without evidence of urinary tract calculus, suspicious lesion or hydronephrosis. No bladder abnormalities are seen.  Stomach/Bowel: No evidence of bowel wall thickening, distention or surrounding inflammatory change.The appendix appears normal. There is moderate stool throughout the colon.  Vascular/Lymphatic: There are no enlarged abdominal or pelvic lymph nodes. No significant arterial abnormalities are identified. There are varices of the left ovarian vein.  Reproductive: The uterus and ovaries appear normal. Left ovarian vein varices noted.  Other: No evidence of abdominal wall mass or hernia.  Musculoskeletal: No acute or significant osseous findings.  IMPRESSION: 1. Numerous enlarged left axillary and subpectoral lymph nodes consistent with nodal metastases from the patient's left breast cancer. 2. No mediastinal or internal mammary nodal involvement identified. 3. No evidence of distant metastasis. 4. Left ovarian vein varices.   Electronically Signed   By: Richardean Sale M.D.   On: 10/30/2014 15:18   Nm Bone Scan Whole Body  10/30/2014   CLINICAL DATA:  New diagnosis of right breast cancer staging. Initial encounter.  EXAM: NUCLEAR MEDICINE WHOLE BODY BONE SCAN  TECHNIQUE: Whole body anterior and posterior images were obtained approximately 3 hours after intravenous injection of radiopharmaceutical.  RADIOPHARMACEUTICALS:  26.9 mCi Technetium-25mMDP IV  COMPARISON:  CTs chest, abdomen and pelvis done today.  FINDINGS: The  osseous activity is within normal limits. There is no evidence of metastatic disease. The soft tissue activity appears normal.  IMPRESSION: No evidence of osseous metastatic disease.   Electronically Signed   By: WRichardean SaleM.D.   On: 10/30/2014 14:58   Ct Abdomen Pelvis W Contrast  10/30/2014   CLINICAL DATA:  New diagnosis of left breast cancer. Staging. Initial encounter. Next  EXAM: CT CHEST, ABDOMEN, AND PELVIS WITH CONTRAST  TECHNIQUE: Multidetector CT imaging of the chest, abdomen and pelvis was performed following the standard protocol during bolus administration of intravenous contrast.  CONTRAST:  1017mOMNIPAQUE IOHEXOL 300 MG/ML  SOLN  COMPARISON:  Breast MRI 10/22/2014.  FINDINGS: CT CHEST FINDINGS  Mediastinum/Nodes: There are no enlarged supraclavicular, internal mammary, mediastinal or hilar lymph nodes. Multiple enlarged left axillary and subpectoral lymph nodes are noted including an 11 mm node on image number 10, a 13 mm node on image 15 and a  15 mm node on image 24. The thyroid gland, trachea and esophagus demonstrate no significant findings. The heart size is normal. There is no pericardial effusion.There is soft tissue emphysema on the lower right neck attributed to recent Port-A-Cath placement. Catheter tip is at the SVC right atrial junction.  Lungs/Pleura: There is no pleural effusion.The lungs are clear.  Musculoskeletal/Chest wall: Asymmetric soft tissue mass laterally in the left breast adjacent to biopsy clip measures 13 mm on image 27. There is a small asymmetric nodular density in the upper inner quadrant of the left breast which may correspond with the lesion seen on MRI. There is a biopsy clip laterally in the right breast. No worrisome osseous findings.  CT ABDOMEN AND PELVIS FINDINGS  Hepatobiliary: Adjacent cysts in the right hepatic lobe measure up to 1.4 cm on image 54. No suspicious liver lesions. No evidence of gallstones, gallbladder wall thickening or biliary  dilatation.  Pancreas: Unremarkable. No pancreatic ductal dilatation or surrounding inflammatory changes.  Spleen: Normal in size without focal abnormality.  Adrenals/Urinary Tract: Both adrenal glands appear normal.The kidneys appear normal without evidence of urinary tract calculus, suspicious lesion or hydronephrosis. No bladder abnormalities are seen.  Stomach/Bowel: No evidence of bowel wall thickening, distention or surrounding inflammatory change.The appendix appears normal. There is moderate stool throughout the colon.  Vascular/Lymphatic: There are no enlarged abdominal or pelvic lymph nodes. No significant arterial abnormalities are identified. There are varices of the left ovarian vein.  Reproductive: The uterus and ovaries appear normal. Left ovarian vein varices noted.  Other: No evidence of abdominal wall mass or hernia.  Musculoskeletal: No acute or significant osseous findings.  IMPRESSION: 1. Numerous enlarged left axillary and subpectoral lymph nodes consistent with nodal metastases from the patient's left breast cancer. 2. No mediastinal or internal mammary nodal involvement identified. 3. No evidence of distant metastasis. 4. Left ovarian vein varices.   Electronically Signed   By: Richardean Sale M.D.   On: 10/30/2014 15:18   Dg Chest Portable 1 View  10/29/2014   CLINICAL DATA:  54 year old female with breast cancer, porta cath placement. Initial encounter.  EXAM: PORTABLE CHEST - 1 VIEW  COMPARISON:  None.  FINDINGS: Portable AP semi upright view at 1217 hours. Right chest IJ approach porta cath in place. Catheter tip at the cavoatrial junction level. Normal cardiac size and mediastinal contours. No pneumothorax. Allowing for portable technique, the lungs are clear. Mild gaseous distension of the gastric fundus.  IMPRESSION: Right IJ approach porta cath placed with no adverse features. Catheter tip at the cavoatrial junction level.   Electronically Signed   By: Genevie Ann M.D.   On: 10/29/2014  12:22   Dg Fluoro Guide Cv Line-no Report  10/29/2014   CLINICAL DATA:    FLOURO GUIDE CV LINE  Fluoroscopy was utilized by the requesting physician.  No radiographic  interpretation.      ASSESSMENT & PLAN:  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:00 position, left axillary lymph node palpable, ER/PR negative, 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 5.32, T1 cN1 M0 stage II a clinical stage  Treatment Plan: Neoadjuvant chemotherapy with Taxotere, carboplatin, Herceptin and Perjeta every 3 weeks 6 cycles to start 11/01/2014 (patient will need a breast MRI and CT chest after conclusion of neoadjuvant chemotherapy because his subpectoral nodes will need to be analyzed but the CT scan)  CT CAP and Bone Scan: 10/30/14 Numerous enlarged left axillary and  subpectoral lymph nodes consistent with nodal metastases from the patient's left breast cancer.  ECHO 10/21/14: EF 60-65%  Chemo Monitoring: 1.EF 60-65% 2. Anti emetics were reviewed  No orders of the defined types were placed in this encounter.   The patient has a good understanding of the overall plan. she agrees with it. she will call with any problems that may develop before the next visit here.   Rulon Eisenmenger, MD

## 2014-10-31 NOTE — Telephone Encounter (Signed)
Appointments made and avs printed for patient °

## 2014-11-01 ENCOUNTER — Other Ambulatory Visit: Payer: PRIVATE HEALTH INSURANCE

## 2014-11-01 ENCOUNTER — Ambulatory Visit (HOSPITAL_BASED_OUTPATIENT_CLINIC_OR_DEPARTMENT_OTHER): Payer: PRIVATE HEALTH INSURANCE

## 2014-11-01 VITALS — BP 125/78 | HR 65 | Temp 97.4°F | Resp 16

## 2014-11-01 DIAGNOSIS — C773 Secondary and unspecified malignant neoplasm of axilla and upper limb lymph nodes: Secondary | ICD-10-CM | POA: Diagnosis not present

## 2014-11-01 DIAGNOSIS — Z5111 Encounter for antineoplastic chemotherapy: Secondary | ICD-10-CM

## 2014-11-01 DIAGNOSIS — C50412 Malignant neoplasm of upper-outer quadrant of left female breast: Secondary | ICD-10-CM | POA: Diagnosis not present

## 2014-11-01 DIAGNOSIS — Z5112 Encounter for antineoplastic immunotherapy: Secondary | ICD-10-CM

## 2014-11-01 MED ORDER — HEPARIN SOD (PORK) LOCK FLUSH 100 UNIT/ML IV SOLN
500.0000 [IU] | Freq: Once | INTRAVENOUS | Status: AC | PRN
  Administered 2014-11-01: 500 [IU]
  Filled 2014-11-01: qty 5

## 2014-11-01 MED ORDER — DIPHENHYDRAMINE HCL 25 MG PO CAPS
25.0000 mg | ORAL_CAPSULE | Freq: Once | ORAL | Status: AC
  Administered 2014-11-01: 25 mg via ORAL

## 2014-11-01 MED ORDER — DOCETAXEL CHEMO INJECTION 160 MG/16ML
75.0000 mg/m2 | Freq: Once | INTRAVENOUS | Status: AC
  Administered 2014-11-01: 110 mg via INTRAVENOUS
  Filled 2014-11-01: qty 11

## 2014-11-01 MED ORDER — TRASTUZUMAB CHEMO INJECTION 440 MG
8.0000 mg/kg | Freq: Once | INTRAVENOUS | Status: AC
  Administered 2014-11-01: 420 mg via INTRAVENOUS
  Filled 2014-11-01: qty 20

## 2014-11-01 MED ORDER — DIPHENHYDRAMINE HCL 25 MG PO CAPS
ORAL_CAPSULE | ORAL | Status: AC
  Filled 2014-11-01: qty 1

## 2014-11-01 MED ORDER — SODIUM CHLORIDE 0.9 % IV SOLN
Freq: Once | INTRAVENOUS | Status: AC
  Administered 2014-11-01: 11:00:00 via INTRAVENOUS

## 2014-11-01 MED ORDER — DEXAMETHASONE SODIUM PHOSPHATE 100 MG/10ML IJ SOLN
Freq: Once | INTRAMUSCULAR | Status: AC
  Administered 2014-11-01: 15:00:00 via INTRAVENOUS
  Filled 2014-11-01: qty 1

## 2014-11-01 MED ORDER — PALONOSETRON HCL INJECTION 0.25 MG/5ML
INTRAVENOUS | Status: AC
  Filled 2014-11-01: qty 5

## 2014-11-01 MED ORDER — SODIUM CHLORIDE 0.9 % IV SOLN
539.4000 mg | Freq: Once | INTRAVENOUS | Status: AC
  Administered 2014-11-01: 540 mg via INTRAVENOUS
  Filled 2014-11-01: qty 54

## 2014-11-01 MED ORDER — PEGFILGRASTIM 6 MG/0.6ML ~~LOC~~ PSKT
6.0000 mg | PREFILLED_SYRINGE | Freq: Once | SUBCUTANEOUS | Status: AC
  Administered 2014-11-01: 6 mg via SUBCUTANEOUS
  Filled 2014-11-01: qty 0.6

## 2014-11-01 MED ORDER — ACETAMINOPHEN 325 MG PO TABS
ORAL_TABLET | ORAL | Status: AC
  Filled 2014-11-01: qty 2

## 2014-11-01 MED ORDER — ACETAMINOPHEN 325 MG PO TABS
650.0000 mg | ORAL_TABLET | Freq: Once | ORAL | Status: AC
  Administered 2014-11-01: 650 mg via ORAL

## 2014-11-01 MED ORDER — SODIUM CHLORIDE 0.9 % IV SOLN
840.0000 mg | Freq: Once | INTRAVENOUS | Status: AC
  Administered 2014-11-01: 840 mg via INTRAVENOUS
  Filled 2014-11-01: qty 28

## 2014-11-01 MED ORDER — SODIUM CHLORIDE 0.9 % IJ SOLN
10.0000 mL | INTRAMUSCULAR | Status: DC | PRN
  Administered 2014-11-01: 10 mL
  Filled 2014-11-01: qty 10

## 2014-11-01 MED ORDER — PALONOSETRON HCL INJECTION 0.25 MG/5ML
0.2500 mg | Freq: Once | INTRAVENOUS | Status: AC
  Administered 2014-11-01: 0.25 mg via INTRAVENOUS

## 2014-11-01 NOTE — Patient Instructions (Signed)
[name] Discharge Instructions for Patients Receiving Chemotherapy  Today you received the following chemotherapy agents: Herceptin, Perjeta, Taxotere, Carboplatin.  To help prevent nausea and vomiting after your treatment, we encourage you to take your nausea medication as prescribed by your physician: Compazine 10 mg every 6 hrs as needed for nausea; Zofran 8 mg two times daily, start 3 days after chemotherapy on 11/04/14.   If you develop nausea and vomiting that is not controlled by your nausea medication, call the clinic.   BELOW ARE SYMPTOMS THAT SHOULD BE REPORTED IMMEDIATELY:  *FEVER GREATER THAN 100.5 F  *CHILLS WITH OR WITHOUT FEVER  NAUSEA AND VOMITING THAT IS NOT CONTROLLED WITH YOUR NAUSEA MEDICATION  *UNUSUAL SHORTNESS OF BREATH  *UNUSUAL BRUISING OR BLEEDING  TENDERNESS IN MOUTH AND THROAT WITH OR WITHOUT PRESENCE OF ULCERS  *URINARY PROBLEMS  *BOWEL PROBLEMS  UNUSUAL RASH Items with * indicate a potential emergency and should be followed up as soon as possible.  Feel free to call the clinic you have any questions or concerns. The clinic phone number is [phone].  Please show the [facility] at check-in to the Emergency Department and triage nurse.  Trastuzumab injection for infusion What is this medicine? TRASTUZUMAB (tras TOO zoo mab) is a monoclonal antibody. It targets a protein called HER2. This protein is found in some stomach and breast cancers. This medicine can stop cancer cell growth. This medicine may be used with other cancer treatments. This medicine may be used for other purposes; ask your health care provider or pharmacist if you have questions. COMMON BRAND NAME(S): Herceptin What should I tell my health care provider before I take this medicine? They need to know if you have any of these conditions: -heart disease -heart failure -infection (especially a virus infection such as chickenpox, cold sores, or  herpes) -lung or breathing disease, like asthma -recent or ongoing radiation therapy -an unusual or allergic reaction to trastuzumab, benzyl alcohol, or other medications, foods, dyes, or preservatives -pregnant or trying to get pregnant -breast-feeding How should I use this medicine? This drug is given as an infusion into a vein. It is administered in a hospital or clinic by a specially trained health care professional. Talk to your pediatrician regarding the use of this medicine in children. This medicine is not approved for use in children. Overdosage: If you think you have taken too much of this medicine contact a poison control center or emergency room at once. NOTE: This medicine is only for you. Do not share this medicine with others. What if I miss a dose? It is important not to miss a dose. Call your doctor or health care professional if you are unable to keep an appointment. What may interact with this medicine? -cyclophosphamide -doxorubicin -warfarin This list may not describe all possible interactions. Give your health care provider a list of all the medicines, herbs, non-prescription drugs, or dietary supplements you use. Also tell them if you smoke, drink alcohol, or use illegal drugs. Some items may interact with your medicine. What should I watch for while using this medicine? Visit your doctor for checks on your progress. Report any side effects. Continue your course of treatment even though you feel ill unless your doctor tells you to stop. Call your doctor or health care professional for advice if you get a fever, chills or sore throat, or other symptoms of a cold or flu. Do not treat yourself. Try to avoid being around people who are  sick. You may experience fever, chills and shaking during your first infusion. These effects are usually mild and can be treated with other medicines. Report any side effects during the infusion to your health care professional. Fever and chills  usually do not happen with later infusions. What side effects may I notice from receiving this medicine? Side effects that you should report to your doctor or other health care professional as soon as possible: -breathing difficulties -chest pain or palpitations -cough -dizziness or fainting -fever or chills, sore throat -skin rash, itching or hives -swelling of the legs or ankles -unusually weak or tired Side effects that usually do not require medical attention (report to your doctor or other health care professional if they continue or are bothersome): -loss of appetite -headache -muscle aches -nausea This list may not describe all possible side effects. Call your doctor for medical advice about side effects. You may report side effects to FDA at 1-800-FDA-1088. Where should I keep my medicine? This drug is given in a hospital or clinic and will not be stored at home. NOTE: This sheet is a summary. It may not cover all possible information. If you have questions about this medicine, talk to your doctor, pharmacist, or health care provider.  2015, [employer] Standard. (2009-04-18 13:43:15)  Pertuzumab injection What is this medicine? PERTUZUMAB (per TOOZ ue mab) is a monoclonal antibody that targets a protein called HER2. HER2 is found in some breast cancers. This medicine can stop cancer cell growth. This medicine is used with other cancer treatments. This medicine may be used for other purposes; ask your health care provider or pharmacist if you have questions. COMMON BRAND NAME(S): PERJETA What should I tell my health care provider before I take this medicine? They need to know if you have any of these conditions: -heart disease -heart failure -high blood pressure -history of irregular heart beat -recent or ongoing radiation therapy -an unusual or allergic reaction to pertuzumab, other medicines, foods, dyes, or preservatives -pregnant or trying to get  pregnant -breast-feeding How should I use this medicine? This medicine is for infusion into a vein. It is given by a health care professional in a hospital or clinic setting. Talk to your pediatrician regarding the use of this medicine in children. Special care may be needed. Overdosage: If you think you've taken too much of this medicine contact a poison control center or emergency room at once. Overdosage: If you think you have taken too much of this medicine contact a poison control center or emergency room at once. NOTE: This medicine is only for you. Do not share this medicine with others. What if I miss a dose? It is important not to miss your dose. Call your doctor or health care professional if you are unable to keep an appointment. What may interact with this medicine? Interactions are not expected. Give your health care provider a list of all the medicines, herbs, non-prescription drugs, or dietary supplements you use. Also tell them if you smoke, drink alcohol, or use illegal drugs. Some items may interact with your medicine. This list may not describe all possible interactions. Give your health care provider a list of all the medicines, herbs, non-prescription drugs, or dietary supplements you use. Also tell them if you smoke, drink alcohol, or use illegal drugs. Some items may interact with your medicine. What should I watch for while using this medicine? Your condition will be monitored carefully while you are receiving this medicine. Report any side effects. Continue  your course of treatment even though you feel ill unless your doctor tells you to stop. Do not become pregnant while taking this medicine. Women should inform their doctor if they wish to become pregnant or think they might be pregnant. There is a potential for serious side effects to an unborn child. Talk to your health care professional or pharmacist for more information. Do not breast-feed an infant while taking this  medicine. Call your doctor or health care professional for advice if you get a fever, chills or sore throat, or other symptoms of a cold or flu. Do not treat yourself. Try to avoid being around people who are sick. You may experience fever, chills, and headache during the infusion. Report any side effects during the infusion to your health care professional. What side effects may I notice from receiving this medicine? Side effects that you should report to your doctor or health care professional as soon as possible: -breathing problems -chest pain or palpitations -dizziness -feeling faint or lightheaded -fever or chills -skin rash, itching or hives -sore throat -swelling of the face, lips, or tongue -swelling of the legs or ankles -unusually weak or tired Side effects that usually do not require medical attention (Report these to your doctor or health care professional if they continue or are bothersome.): -diarrhea -hair loss -nausea, vomiting -tiredness This list may not describe all possible side effects. Call your doctor for medical advice about side effects. You may report side effects to FDA at 1-800-FDA-1088. Where should I keep my medicine? This drug is given in a hospital or clinic and will not be stored at home. NOTE: This sheet is a summary. It may not cover all possible information. If you have questions about this medicine, talk to your doctor, pharmacist, or health care provider.  2015, [employer] Standard. (2012-04-12 16:54:15)  Docetaxel injection What is this medicine? DOCETAXEL (doe se TAX el) is a chemotherapy drug. It targets fast dividing cells, like cancer cells, and causes these cells to die. This medicine is used to treat many types of cancers like breast cancer, certain stomach cancers, head and neck cancer, lung cancer, and prostate cancer. This medicine may be used for other purposes; ask your health care provider or pharmacist if you have  questions. COMMON BRAND NAME(S): Docefrez, Taxotere What should I tell my health care provider before I take this medicine? They need to know if you have any of these conditions: -infection (especially a virus infection such as chickenpox, cold sores, or herpes) -liver disease -low blood counts, like low white cell, platelet, or red cell counts -an unusual or allergic reaction to docetaxel, polysorbate 80, other chemotherapy agents, other medicines, foods, dyes, or preservatives -pregnant or trying to get pregnant -breast-feeding How should I use this medicine? This drug is given as an infusion into a vein. It is administered in a hospital or clinic by a specially trained health care professional. Talk to your pediatrician regarding the use of this medicine in children. Special care may be needed. Overdosage: If you think you have taken too much of this medicine contact a poison control center or emergency room at once. NOTE: This medicine is only for you. Do not share this medicine with others. What if I miss a dose? It is important not to miss your dose. Call your doctor or health care professional if you are unable to keep an appointment. What may interact with this medicine? -cyclosporine -erythromycin -ketoconazole -medicines to increase blood counts like filgrastim, pegfilgrastim, sargramostim -  vaccines Talk to your doctor or health care professional before taking any of these medicines: -acetaminophen -aspirin -ibuprofen -ketoprofen -naproxen This list may not describe all possible interactions. Give your health care provider a list of all the medicines, herbs, non-prescription drugs, or dietary supplements you use. Also tell them if you smoke, drink alcohol, or use illegal drugs. Some items may interact with your medicine. What should I watch for while using this medicine? Your condition will be monitored carefully while you are receiving this medicine. You will need important  blood work done while you are taking this medicine. This drug may make you feel generally unwell. This is not uncommon, as chemotherapy can affect healthy cells as well as cancer cells. Report any side effects. Continue your course of treatment even though you feel ill unless your doctor tells you to stop. In some cases, you may be given additional medicines to help with side effects. Follow all directions for their use. Call your doctor or health care professional for advice if you get a fever, chills or sore throat, or other symptoms of a cold or flu. Do not treat yourself. This drug decreases your body's ability to fight infections. Try to avoid being around people who are sick. This medicine may increase your risk to bruise or bleed. Call your doctor or health care professional if you notice any unusual bleeding. Be careful brushing and flossing your teeth or using a toothpick because you may get an infection or bleed more easily. If you have any dental work done, tell your dentist you are receiving this medicine. Avoid taking products that contain aspirin, acetaminophen, ibuprofen, naproxen, or ketoprofen unless instructed by your doctor. These medicines may hide a fever. This medicine contains an alcohol in the product. You may get drowsy or dizzy. Do not drive, use machinery, or do anything that needs mental alertness until you know how this medicine affects you. Do not stand or sit up quickly, especially if you are an older patient. This reduces the risk of dizzy or fainting spells. Avoid alcoholic drinks Do not become pregnant while taking this medicine. Women should inform their doctor if they wish to become pregnant or think they might be pregnant. There is a potential for serious side effects to an unborn child. Talk to your health care professional or pharmacist for more information. Do not breast-feed an infant while taking this medicine. What side effects may I notice from receiving this  medicine? Side effects that you should report to your doctor or health care professional as soon as possible: -allergic reactions like skin rash, itching or hives, swelling of the face, lips, or tongue -low blood counts - This drug may decrease the number of white blood cells, red blood cells and platelets. You may be at increased risk for infections and bleeding. -signs of infection - fever or chills, cough, sore throat, pain or difficulty passing urine -signs of decreased platelets or bleeding - bruising, pinpoint red spots on the skin, black, tarry stools, nosebleeds -signs of decreased red blood cells - unusually weak or tired, fainting spells, lightheadedness -breathing problems -fast or irregular heartbeat -low blood pressure -mouth sores -nausea and vomiting -pain, swelling, redness or irritation at the injection site -pain, tingling, numbness in the hands or feet -swelling of the ankle, feet, hands -weight gain Side effects that usually do not require medical attention (report to your prescriber or health care professional if they continue or are bothersome): -bone pain -complete hair loss including hair on  your head, underarms, pubic hair, eyebrows, and eyelashes -diarrhea -excessive tearing -changes in the color of fingernails -loosening of the fingernails -nausea -muscle pain -red flush to skin -sweating -weak or tired This list may not describe all possible side effects. Call your doctor for medical advice about side effects. You may report side effects to FDA at 1-800-FDA-1088. Where should I keep my medicine? This drug is given in a hospital or clinic and will not be stored at home. NOTE: This sheet is a summary. It may not cover all possible information. If you have questions about this medicine, talk to your doctor, pharmacist, or health care provider.  2015, [employer] Standard. (2013-05-10 22:21:02)  Carboplatin injection What is this medicine? CARBOPLATIN  (KAR boe pla tin) is a chemotherapy drug. It targets fast dividing cells, like cancer cells, and causes these cells to die. This medicine is used to treat ovarian cancer and many other cancers. This medicine may be used for other purposes; ask your health care provider or pharmacist if you have questions. COMMON BRAND NAME(S): Paraplatin What should I tell my health care provider before I take this medicine? They need to know if you have any of these conditions: -blood disorders -hearing problems -kidney disease -recent or ongoing radiation therapy -an unusual or allergic reaction to carboplatin, cisplatin, other chemotherapy, other medicines, foods, dyes, or preservatives -pregnant or trying to get pregnant -breast-feeding How should I use this medicine? This drug is usually given as an infusion into a vein. It is administered in a hospital or clinic by a specially trained health care professional. Talk to your pediatrician regarding the use of this medicine in children. Special care may be needed. Overdosage: If you think you have taken too much of this medicine contact a poison control center or emergency room at once. NOTE: This medicine is only for you. Do not share this medicine with others. What if I miss a dose? It is important not to miss a dose. Call your doctor or health care professional if you are unable to keep an appointment. What may interact with this medicine? -medicines for seizures -medicines to increase blood counts like filgrastim, pegfilgrastim, sargramostim -some antibiotics like amikacin, gentamicin, neomycin, streptomycin, tobramycin -vaccines Talk to your doctor or health care professional before taking any of these medicines: -acetaminophen -aspirin -ibuprofen -ketoprofen -naproxen This list may not describe all possible interactions. Give your health care provider a list of all the medicines, herbs, non-prescription drugs, or dietary supplements you use.  Also tell them if you smoke, drink alcohol, or use illegal drugs. Some items may interact with your medicine. What should I watch for while using this medicine? Your condition will be monitored carefully while you are receiving this medicine. You will need important blood work done while you are taking this medicine. This drug may make you feel generally unwell. This is not uncommon, as chemotherapy can affect healthy cells as well as cancer cells. Report any side effects. Continue your course of treatment even though you feel ill unless your doctor tells you to stop. In some cases, you may be given additional medicines to help with side effects. Follow all directions for their use. Call your doctor or health care professional for advice if you get a fever, chills or sore throat, or other symptoms of a cold or flu. Do not treat yourself. This drug decreases your body's ability to fight infections. Try to avoid being around people who are sick. This medicine may increase your risk to  bruise or bleed. Call your doctor or health care professional if you notice any unusual bleeding. Be careful brushing and flossing your teeth or using a toothpick because you may get an infection or bleed more easily. If you have any dental work done, tell your dentist you are receiving this medicine. Avoid taking products that contain aspirin, acetaminophen, ibuprofen, naproxen, or ketoprofen unless instructed by your doctor. These medicines may hide a fever. Do not become pregnant while taking this medicine. Women should inform their doctor if they wish to become pregnant or think they might be pregnant. There is a potential for serious side effects to an unborn child. Talk to your health care professional or pharmacist for more information. Do not breast-feed an infant while taking this medicine. What side effects may I notice from receiving this medicine? Side effects that you should report to your doctor or health care  professional as soon as possible: -allergic reactions like skin rash, itching or hives, swelling of the face, lips, or tongue -signs of infection - fever or chills, cough, sore throat, pain or difficulty passing urine -signs of decreased platelets or bleeding - bruising, pinpoint red spots on the skin, black, tarry stools, nosebleeds -signs of decreased red blood cells - unusually weak or tired, fainting spells, lightheadedness -breathing problems -changes in hearing -changes in vision -chest pain -high blood pressure -low blood counts - This drug may decrease the number of white blood cells, red blood cells and platelets. You may be at increased risk for infections and bleeding. -nausea and vomiting -pain, swelling, redness or irritation at the injection site -pain, tingling, numbness in the hands or feet -problems with balance, talking, walking -trouble passing urine or change in the amount of urine Side effects that usually do not require medical attention (report to your doctor or health care professional if they continue or are bothersome): -hair loss -loss of appetite -metallic taste in the mouth or changes in taste This list may not describe all possible side effects. Call your doctor for medical advice about side effects. You may report side effects to FDA at 1-800-FDA-1088. Where should I keep my medicine? This drug is given in a hospital or clinic and will not be stored at home. NOTE: This sheet is a summary. It may not cover all possible information. If you have questions about this medicine, talk to your doctor, pharmacist, or health care provider.  2015, [employer] Standard. (2007-09-19 14:38:05)

## 2014-11-04 ENCOUNTER — Telehealth: Payer: Self-pay | Admitting: *Deleted

## 2014-11-04 NOTE — Telephone Encounter (Signed)
Patient received 1st TCHP on 5/6. States that she has had a migraine since last Sunday. Patient advised to call her neurologist for this. Stated that her neurologist is off today and she did not want to see anyone different. Offered for her to see Selena Lesser here today but patient declined. States that she will see her neurologist tomorrow. States that she is having some nausea but no vomiting. She is also having diarrhea but taking Immodium for this. Patient states that she is eating and drinking and does not feel that she is dehydrated. Advised patient to call if any of these symptoms worsen. Patient verbalized understanding.

## 2014-11-06 ENCOUNTER — Other Ambulatory Visit (HOSPITAL_BASED_OUTPATIENT_CLINIC_OR_DEPARTMENT_OTHER): Payer: PRIVATE HEALTH INSURANCE

## 2014-11-06 ENCOUNTER — Ambulatory Visit (HOSPITAL_BASED_OUTPATIENT_CLINIC_OR_DEPARTMENT_OTHER): Payer: PRIVATE HEALTH INSURANCE | Admitting: Nurse Practitioner

## 2014-11-06 ENCOUNTER — Encounter: Payer: Self-pay | Admitting: Nurse Practitioner

## 2014-11-06 ENCOUNTER — Telehealth: Payer: Self-pay | Admitting: *Deleted

## 2014-11-06 VITALS — BP 100/64 | HR 88 | Temp 97.8°F | Resp 16 | Wt 104.9 lb

## 2014-11-06 DIAGNOSIS — R112 Nausea with vomiting, unspecified: Secondary | ICD-10-CM | POA: Diagnosis not present

## 2014-11-06 DIAGNOSIS — G43709 Chronic migraine without aura, not intractable, without status migrainosus: Secondary | ICD-10-CM | POA: Diagnosis not present

## 2014-11-06 DIAGNOSIS — C50412 Malignant neoplasm of upper-outer quadrant of left female breast: Secondary | ICD-10-CM

## 2014-11-06 DIAGNOSIS — G43909 Migraine, unspecified, not intractable, without status migrainosus: Secondary | ICD-10-CM | POA: Insufficient documentation

## 2014-11-06 DIAGNOSIS — C773 Secondary and unspecified malignant neoplasm of axilla and upper limb lymph nodes: Secondary | ICD-10-CM

## 2014-11-06 LAB — COMPREHENSIVE METABOLIC PANEL (CC13)
ALBUMIN: 3.8 g/dL (ref 3.5–5.0)
ALK PHOS: 91 U/L (ref 40–150)
ALT: 45 U/L (ref 0–55)
ANION GAP: 11 meq/L (ref 3–11)
AST: 37 U/L — ABNORMAL HIGH (ref 5–34)
BUN: 12.5 mg/dL (ref 7.0–26.0)
CO2: 25 meq/L (ref 22–29)
Calcium: 9 mg/dL (ref 8.4–10.4)
Chloride: 101 mEq/L (ref 98–109)
Creatinine: 0.7 mg/dL (ref 0.6–1.1)
GLUCOSE: 99 mg/dL (ref 70–140)
POTASSIUM: 3.7 meq/L (ref 3.5–5.1)
SODIUM: 136 meq/L (ref 136–145)
Total Bilirubin: 0.72 mg/dL (ref 0.20–1.20)
Total Protein: 6.6 g/dL (ref 6.4–8.3)

## 2014-11-06 LAB — MAGNESIUM (CC13): Magnesium: 2.1 mg/dl (ref 1.5–2.5)

## 2014-11-06 LAB — CBC WITH DIFFERENTIAL/PLATELET
BASO%: 1.2 % (ref 0.0–2.0)
Basophils Absolute: 0 10*3/uL (ref 0.0–0.1)
EOS%: 3.3 % (ref 0.0–7.0)
Eosinophils Absolute: 0.1 10*3/uL (ref 0.0–0.5)
HEMATOCRIT: 41.5 % (ref 34.8–46.6)
HEMOGLOBIN: 13.9 g/dL (ref 11.6–15.9)
LYMPH%: 26.2 % (ref 14.0–49.7)
MCH: 29.8 pg (ref 25.1–34.0)
MCHC: 33.4 g/dL (ref 31.5–36.0)
MCV: 89.1 fL (ref 79.5–101.0)
MONO#: 0.1 10*3/uL (ref 0.1–0.9)
MONO%: 2.7 % (ref 0.0–14.0)
NEUT#: 2.8 10*3/uL (ref 1.5–6.5)
NEUT%: 66.6 % (ref 38.4–76.8)
PLATELETS: 165 10*3/uL (ref 145–400)
RBC: 4.66 10*6/uL (ref 3.70–5.45)
RDW: 12.9 % (ref 11.2–14.5)
WBC: 4.2 10*3/uL (ref 3.9–10.3)
lymph#: 1.1 10*3/uL (ref 0.9–3.3)

## 2014-11-06 MED ORDER — PROCHLORPERAZINE EDISYLATE 5 MG/ML IJ SOLN
10.0000 mg | Freq: Once | INTRAMUSCULAR | Status: AC
  Administered 2014-11-06: 10 mg via INTRAVENOUS

## 2014-11-06 MED ORDER — HEPARIN SOD (PORK) LOCK FLUSH 100 UNIT/ML IV SOLN
500.0000 [IU] | Freq: Once | INTRAVENOUS | Status: AC
  Administered 2014-11-06: 500 [IU] via INTRAVENOUS
  Filled 2014-11-06: qty 5

## 2014-11-06 MED ORDER — SODIUM CHLORIDE 0.9 % IV SOLN
INTRAVENOUS | Status: AC
  Administered 2014-11-06: 15:00:00 via INTRAVENOUS

## 2014-11-06 MED ORDER — KETOROLAC TROMETHAMINE 30 MG/ML IJ SOLN
30.0000 mg | Freq: Once | INTRAMUSCULAR | Status: AC
  Administered 2014-11-06: 30 mg via INTRAVENOUS

## 2014-11-06 MED ORDER — DIPHENHYDRAMINE HCL 50 MG/ML IJ SOLN
25.0000 mg | Freq: Once | INTRAMUSCULAR | Status: AC
  Administered 2014-11-06: 25 mg via INTRAVENOUS

## 2014-11-06 MED ORDER — SODIUM CHLORIDE 0.9 % IJ SOLN
10.0000 mL | INTRAMUSCULAR | Status: DC | PRN
  Administered 2014-11-06: 10 mL via INTRAVENOUS
  Filled 2014-11-06: qty 10

## 2014-11-06 NOTE — Patient Instructions (Signed)

## 2014-11-06 NOTE — Progress Notes (Signed)
SYMPTOM MANAGEMENT CLINIC   HPI: [name] [name] 54 y.o. female diagnosed with breast cancer.  Currently undergoing Taxotere/carboplatin/Herceptin/Perjeta chemotherapy regimen.  Patient has a history of chronic migraine; and takes both atenolol and Relpax prophylactically per her neurologist.  Patient states that has had a chronic migraine for approximate 10 days; even before she started her first cycle of chemotherapy.  She states she has been taking her anti--migraine medications as directed.  She is complaining of chronic nausea and occasional vomiting.  She feels mildly dehydrated today.  She is experiencing some photosensitivity as well.  She denies any other neurological deficits whatsoever.  HPI  ROS  Past Medical History  Diagnosis Date  . Osteopenia   . Migraines   . Neurogenic bladder   . Wears contact lenses   . Family history of breast cancer   . Family history of colon cancer   . Breast cancer of upper-outer quadrant of right female breast 10/11/2014  . Breast cancer 2016    ER-/PR-/Her2+    Past Surgical History  Procedure Laterality Date  . Vein ligation      both legs  . Breast surgery      breast [id number]  . Colonoscopy    . Upper gi endoscopy    . Portacath placement Right 10/29/2014    Procedure: INSERTION PORT-A-CATH WITH ULTRASOUND;  Surgeon: Erroll Luna, MD;  Location: [facility];  Service: General;  Laterality: Right;    has Breast cancer of upper-outer quadrant of left female breast; Family history of breast cancer; Family history of colon cancer; and Migraine headache on her problem list.    is allergic to butorphanol; meperidine; and sulfa antibiotics.    Medication List       This list is accurate as of: 11/06/14  5:56 PM.  Always use your most recent med list.               acetaminophen 325 MG tablet  Commonly known as:  TYLENOL  Take 650 mg by mouth every 6 (six) hours as needed.     ACIDOPHILUS PO  Take by mouth.     atenolol-chlorthalidone 50-25 MG per tablet  Commonly known as:  TENORETIC  Take by mouth.     B-COMPLEX PO  Take by mouth.     BLACK COHOSH PO  Take by mouth.     CALCIUM 600 PO  Take by mouth.     dexamethasone 4 MG tablet  Commonly known as:  DECADRON  Take 1 tablet (4 mg total) by mouth 2 (two) times daily. Start the day before Taxotere. Then again the day after chemo for 3 days.     eletriptan 40 MG tablet  Commonly known as:  RELPAX  Take 1 tablet (40 mg total) by mouth every 2 (two) hours as needed for migraine or headache (Max 2 per day).     EVENING PRIMROSE OIL PO  Take 1,300 mg by mouth.     GOODYS EXTRA STRENGTH PO  Take by mouth.     ibuprofen 100 MG tablet  Commonly known as:  ADVIL,MOTRIN  Take 100 mg by mouth every 6 (six) hours as needed for fever.     lidocaine-prilocaine cream  Commonly known as:  EMLA  Apply to affected area once     LORazepam 0.5 MG tablet  Commonly known as:  ATIVAN  Take 1 tablet (0.5 mg total) by mouth every 6 (six) hours as needed (Nausea or vomiting).  ondansetron 8 MG tablet  Commonly known as:  ZOFRAN  Take 1 tablet (8 mg total) by mouth 2 (two) times daily. Start the day after chemo for 3 days. Then take as needed for nausea or vomiting.     oxyCODONE-acetaminophen 5-325 MG per tablet  Commonly known as:  ROXICET  Take 1 tablet by mouth every 4 (four) hours as needed.     PARoxetine 20 MG tablet  Commonly known as:  PAXIL  Take 20 mg by mouth.     prochlorperazine 10 MG tablet  Commonly known as:  COMPAZINE  Take 1 tablet (10 mg total) by mouth every 6 (six) hours as needed (Nausea or vomiting).     promethazine 12.5 MG tablet  Commonly known as:  PHENERGAN  Take 1 tablet (12.5 mg total) by mouth every 6 (six) hours as needed for nausea or vomiting.     solifenacin 5 MG tablet  Commonly known as:  VESICARE  Take 10 mg by mouth.     VITAMIN D (CHOLECALCIFEROL) PO  Take by mouth.         PHYSICAL  EXAMINATION  Oncology Vitals 11/06/2014 11/01/2014 11/01/2014 11/01/2014 11/01/2014 11/01/2014 11/01/2014  Height - - - - - - -  Weight 47.582 kg - - - - - -  Weight (lbs) 104 lbs 14 oz - - - - - -  BMI (kg/m2) - - - - - - -  Temp 97.8 97.4 - 97.5 97.7 - -  Pulse 88 65 61 66 68 67 66  Resp $Rem'16 16 16 19 18 17 16  'KEsY$ SpO2 - 99 97 98 98 - -  BSA (m2) - - - - - - -   BP Readings from Last 3 Encounters:  11/06/14 100/64  11/01/14 125/78  10/31/14 132/82    Physical Exam  Constitutional: She is oriented to person, place, and time. She appears distressed.  HENT:  Head: Normocephalic and atraumatic.  Mouth/Throat: Oropharynx is clear and moist.  Eyes: Conjunctivae and EOM are normal. Pupils are equal, round, and reactive to light. Right eye exhibits no discharge. Left eye exhibits no discharge. No scleral icterus.  Neck: Normal range of motion. Neck supple. No JVD present. No tracheal deviation present. No thyromegaly present.  Cardiovascular: Normal rate, regular rhythm, normal heart sounds and intact distal pulses.   Pulmonary/Chest: Effort normal and breath sounds normal. No respiratory distress. She has no wheezes. She has no rales. She exhibits no tenderness.  Right upper chest Port-A-Cath intact; it appears to be healing well.  No evidence of infection.  Abdominal: Soft. Bowel sounds are normal. She exhibits no distension and no mass. There is no tenderness. There is no rebound and no guarding.  Musculoskeletal: Normal range of motion. She exhibits no edema or tenderness.  Lymphadenopathy:    She has no cervical adenopathy.  Neurological: She is alert and oriented to person, place, and time. Gait normal.  Skin: Skin is warm and dry. No rash noted. No erythema. There is pallor.  Psychiatric: Affect normal.  Nursing note and vitals reviewed.   LABORATORY DATA:. Appointment on 11/06/2014  Component Date Value Ref Range Status  . WBC 11/06/2014 4.2  3.9 - 10.3 10e3/uL Final  . NEUT# 11/06/2014  2.8  1.5 - 6.5 10e3/uL Final  . HGB 11/06/2014 13.9  11.6 - 15.9 g/dL Final  . HCT 11/06/2014 41.5  34.8 - 46.6 % Final  . Platelets 11/06/2014 165  145 - 400 10e3/uL Final  . MCV  11/06/2014 89.1  79.5 - 101.0 fL Final  . MCH 11/06/2014 29.8  25.1 - 34.0 pg Final  . MCHC 11/06/2014 33.4  31.5 - 36.0 g/dL Final  . RBC 11/06/2014 4.66  3.70 - 5.45 10e6/uL Final  . RDW 11/06/2014 12.9  11.2 - 14.5 % Final  . lymph# 11/06/2014 1.1  0.9 - 3.3 10e3/uL Final  . MONO# 11/06/2014 0.1  0.1 - 0.9 10e3/uL Final  . Eosinophils Absolute 11/06/2014 0.1  0.0 - 0.5 10e3/uL Final  . Basophils Absolute 11/06/2014 0.0  0.0 - 0.1 10e3/uL Final  . NEUT% 11/06/2014 66.6  38.4 - 76.8 % Final  . LYMPH% 11/06/2014 26.2  14.0 - 49.7 % Final  . MONO% 11/06/2014 2.7  0.0 - 14.0 % Final  . EOS% 11/06/2014 3.3  0.0 - 7.0 % Final  . BASO% 11/06/2014 1.2  0.0 - 2.0 % Final  . Sodium 11/06/2014 136  136 - 145 mEq/L Final  . Potassium 11/06/2014 3.7  3.5 - 5.1 mEq/L Final  . Chloride 11/06/2014 101  98 - 109 mEq/L Final  . CO2 11/06/2014 25  22 - 29 mEq/L Final  . Glucose 11/06/2014 99  70 - 140 mg/dl Final  . BUN 11/06/2014 12.5  7.0 - 26.0 mg/dL Final  . Creatinine 11/06/2014 0.7  0.6 - 1.1 mg/dL Final  . Total Bilirubin 11/06/2014 0.72  0.20 - 1.20 mg/dL Final  . Alkaline Phosphatase 11/06/2014 91  40 - 150 U/L Final  . AST 11/06/2014 37* 5 - 34 U/L Final  . ALT 11/06/2014 45  0 - 55 U/L Final  . Total Protein 11/06/2014 6.6  6.4 - 8.3 g/dL Final  . Albumin 11/06/2014 3.8  3.5 - 5.0 g/dL Final  . Calcium 11/06/2014 9.0  8.4 - 10.4 mg/dL Final  . Anion Gap 11/06/2014 11  3 - 11 mEq/L Final  . EGFR 11/06/2014 >90  >90 ml/min/1.73 m2 Final   eGFR is calculated using the CKD-EPI Creatinine Equation (2009)  . Magnesium 11/06/2014 2.1  1.5 - 2.5 mg/dl Final     RADIOGRAPHIC STUDIES: No results found.  ASSESSMENT/PLAN:    Breast cancer of upper-outer quadrant of left female breast Patient received her first  cycle of Taxotere/carboplatin/Herceptin/Perjeta chemotherapy on 11/01/2014.  She is scheduled for return for labs and a follow-up visit on 11/08/2014.  She is scheduled for labs and her next chemotherapy on 11/22/2014.   Migraine headache Patient has a history of chronic migraine; and takes both atenolol and Relpax prophylactically per her neurologist.  Patient states that has had a chronic migraine for approximate 10 days; even before she started her first cycle of chemotherapy.  She states she has been taking her anti--migraine medications as directed.  She is complaining of chronic nausea and occasional vomiting.  She feels mildly dehydrated today.  She is experiencing some photosensitivity as well.  She denies any other neurological deficits whatsoever.  On exam.-Patient appears generally uncomfortable; but with neuro intact.  Patient was given Toradol 30 mg IV, Benadryl 25 mg IV, Compazine 10 mg IV; and received 1 L normal saline IV fluid rehydration.  Following this migraine cocktail-patient stated that she felt much better and was ready to go home with her husband.  Advised patient that her labs obtained today revealed a stable blood count; and that she may take a few ibuprofen and her Compazine for the next 24 hours for further treatment of her chronic migraine.  Most likely, patient's chronic migraine symptoms were exacerbated  by her first cycle of chemotherapy.  Advised patient to call/return or go directly to the emergency department if she develops any worsening symptoms whatsoever.   Patient stated understanding of all instructions; and was in agreement with this plan of care. The patient knows to call the clinic with any problems, questions or concerns.   Review/collaboration with Dr. Lindi Adie regarding all aspects of patient's visit today.   Total time spent with patient was 40 minutes;  with greater than 75 percent of that time spent in face to face counseling regarding patient's  symptoms,  and coordination of care and follow up.  Disclaimer: This note was dictated with voice recognition software. Similar sounding words can inadvertently be transcribed and may not be corrected upon review.   Drue Second, NP 11/06/2014

## 2014-11-06 NOTE — Assessment & Plan Note (Signed)
Patient received her first cycle of Taxotere/carboplatin/Herceptin/Perjeta chemotherapy on 11/01/2014.  She is scheduled for return for labs and a follow-up visit on 11/08/2014.  She is scheduled for labs and her next chemotherapy on 11/22/2014.

## 2014-11-06 NOTE — Assessment & Plan Note (Signed)
Patient has a history of chronic migraine; and takes both atenolol and Relpax prophylactically per her neurologist.  Patient states that has had a chronic migraine for approximate 10 days; even before she started her first cycle of chemotherapy.  She states she has been taking her anti--migraine medications as directed.  She is complaining of chronic nausea and occasional vomiting.  She feels mildly dehydrated today.  She is experiencing some photosensitivity as well.  She denies any other neurological deficits whatsoever.  On exam.-Patient appears generally uncomfortable; but with neuro intact.  Patient was given Toradol 30 mg IV, Benadryl 25 mg IV, Compazine 10 mg IV; and received 1 L normal saline IV fluid rehydration.  Following this migraine cocktail-patient stated that she felt much better and was ready to go home with her husband.  Advised patient that her labs obtained today revealed a stable blood count; and that she may take a few ibuprofen and her Compazine for the next 24 hours for further treatment of her chronic migraine.  Most likely, patient's chronic migraine symptoms were exacerbated by her first cycle of chemotherapy.  Advised patient to call/return or go directly to the emergency department if she develops any worsening symptoms whatsoever.

## 2014-11-06 NOTE — Telephone Encounter (Signed)
Spouse called reporting "1st chemotherapy taxotere, perjeta, herceptinwas 11-01-2014 and [name] [name] is having migraine with really bad chemotherapy side effects.  She vomited this morning".  [name] [name] on phone as well states she saw her neurologist Monday, was given a cocktail that shook the migraine only for the night but had this migraine for ten to twelve days and I can't take it.  The chemotherapy has caused diarrhea, vomiting.  I've taken ativan and phenergan.  Trying to drink fluids but I know I haven't had 64 oz."  Call lost. Called her back at 1331 and heard her vomiting [name] [name] describes looks like the blue raspberry ice she ate.  Instructed to come to [facility] for [facility].  Lives in [place] and on her way.  Advised to put a box or bag in car.

## 2014-11-07 ENCOUNTER — Encounter: Payer: Self-pay | Admitting: Genetic Counselor

## 2014-11-07 ENCOUNTER — Telehealth: Payer: Self-pay | Admitting: Genetic Counselor

## 2014-11-07 DIAGNOSIS — Z1379 Encounter for other screening for genetic and chromosomal anomalies: Secondary | ICD-10-CM | POA: Insufficient documentation

## 2014-11-07 NOTE — Telephone Encounter (Signed)
LM on VM with good news about results.

## 2014-11-08 ENCOUNTER — Ambulatory Visit (HOSPITAL_BASED_OUTPATIENT_CLINIC_OR_DEPARTMENT_OTHER): Payer: PRIVATE HEALTH INSURANCE | Admitting: Hematology and Oncology

## 2014-11-08 ENCOUNTER — Telehealth: Payer: Self-pay

## 2014-11-08 ENCOUNTER — Encounter: Payer: Self-pay | Admitting: Hematology and Oncology

## 2014-11-08 ENCOUNTER — Other Ambulatory Visit: Payer: Self-pay | Admitting: *Deleted

## 2014-11-08 ENCOUNTER — Ambulatory Visit: Payer: PRIVATE HEALTH INSURANCE

## 2014-11-08 ENCOUNTER — Telehealth: Payer: Self-pay | Admitting: Hematology and Oncology

## 2014-11-08 ENCOUNTER — Other Ambulatory Visit (HOSPITAL_BASED_OUTPATIENT_CLINIC_OR_DEPARTMENT_OTHER): Payer: PRIVATE HEALTH INSURANCE

## 2014-11-08 ENCOUNTER — Telehealth: Payer: Self-pay | Admitting: *Deleted

## 2014-11-08 ENCOUNTER — Telehealth: Payer: Self-pay | Admitting: Genetic Counselor

## 2014-11-08 VITALS — BP 105/58 | HR 83 | Temp 97.7°F | Resp 18 | Ht 60.0 in | Wt 107.6 lb

## 2014-11-08 DIAGNOSIS — G43909 Migraine, unspecified, not intractable, without status migrainosus: Secondary | ICD-10-CM | POA: Diagnosis not present

## 2014-11-08 DIAGNOSIS — Z171 Estrogen receptor negative status [ER-]: Secondary | ICD-10-CM

## 2014-11-08 DIAGNOSIS — C50412 Malignant neoplasm of upper-outer quadrant of left female breast: Secondary | ICD-10-CM

## 2014-11-08 DIAGNOSIS — R110 Nausea: Secondary | ICD-10-CM

## 2014-11-08 LAB — COMPREHENSIVE METABOLIC PANEL (CC13)
ALK PHOS: 78 U/L (ref 40–150)
ALT: 39 U/L (ref 0–55)
ANION GAP: 10 meq/L (ref 3–11)
AST: 28 U/L (ref 5–34)
Albumin: 3.6 g/dL (ref 3.5–5.0)
BUN: 5.9 mg/dL — ABNORMAL LOW (ref 7.0–26.0)
CO2: 24 mEq/L (ref 22–29)
Calcium: 9.1 mg/dL (ref 8.4–10.4)
Chloride: 104 mEq/L (ref 98–109)
Creatinine: 0.8 mg/dL (ref 0.6–1.1)
EGFR: 86 mL/min/{1.73_m2} — ABNORMAL LOW (ref >90)
Glucose: 113 mg/dl (ref 70–140)
Potassium: 4 mEq/L (ref 3.5–5.1)
Sodium: 138 mEq/L (ref 136–145)
TOTAL PROTEIN: 6.5 g/dL (ref 6.4–8.3)
Total Bilirubin: 0.29 mg/dL (ref 0.20–1.20)

## 2014-11-08 LAB — MANUAL DIFFERENTIAL
ALC: 1.9 10*3/uL (ref 0.9–3.3)
ANC (CHCC MAN DIFF): 6.6 10*3/uL — AB (ref 1.5–6.5)
Band Neutrophils: 9 % (ref 0–10)
Basophil: 0 % (ref 0–2)
Blasts: 0 % (ref 0–0)
EOS%: 0 % (ref 0–7)
LYMPH: 19 % (ref 14–49)
MONO: 16 % — AB (ref 0–14)
Metamyelocytes: 7 % — ABNORMAL HIGH (ref 0–0)
Myelocytes: 5 % — ABNORMAL HIGH (ref 0–0)
Other Cell: 0 % (ref 0–0)
PLT EST: ADEQUATE
PROMYELO: 0 % (ref 0–0)
SEG: 44 % (ref 38–77)
VARIANT LYMPH: 0 % (ref 0–0)
nRBC: 0 % (ref 0–0)

## 2014-11-08 LAB — CBC WITH DIFFERENTIAL/PLATELET
HEMATOCRIT: 40.6 % (ref 34.8–46.6)
HGB: 13.9 g/dL (ref 11.6–15.9)
MCH: 30.6 pg (ref 25.1–34.0)
MCHC: 34.2 g/dL (ref 31.5–36.0)
MCV: 89.4 fL (ref 79.5–101.0)
PLATELETS: 160 10*3/uL (ref 145–400)
RBC: 4.54 10*6/uL (ref 3.70–5.45)
RDW: 12.6 % (ref 11.2–14.5)
WBC: 10.2 10*3/uL (ref 3.9–10.3)

## 2014-11-08 MED ORDER — PROMETHAZINE HCL 25 MG RE SUPP: 25.0000 mg | Freq: Two times a day (BID) | RECTAL | Status: DC | PRN

## 2014-11-08 NOTE — Progress Notes (Signed)
Patient Care Team: Laray Anger, MD as PCP - General (Obstetrics and Gynecology) Erroll Luna, MD as Consulting Physician (General Surgery) Nicholas Lose, MD as Consulting Physician (Hematology and Oncology) Gery Pray, MD as Consulting Physician (Radiation Oncology) Mauro Kaufmann, RN as Registered Nurse Rockwell Germany, RN as Registered Nurse  DIAGNOSIS: Breast cancer of upper-outer quadrant of left female breast   Staging form: Breast, AJCC 7th Edition     Clinical stage from 10/16/2014: Stage IIA (T1c, N1, M0) - Unsigned   SUMMARY OF ONCOLOGIC HISTORY:   Breast cancer of upper-outer quadrant of left female breast   10/01/2014 Mammogram Left breast mammogram and ultrasound for palpable left breast mass which was a maxillary mass with an ill-defined abnormality by ultrasound 1.7 cm at 2:00   10/11/2014 Initial Diagnosis Left breast invasive ductal carcinoma, grade 3, ER/PR negative, 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 5.32, axillary lymph node also positive for cancer   10/30/2014 Imaging CT chest abdomen pelvis and bone scan revealed no evidence of distant metastatic disease but enlarged left axillary and left subpectoral lymph nodes   11/01/2014 -  Neo-Adjuvant Chemotherapy Neoadjuvant TCH Perjeta to 3 weeks 6 cycles followed by Herceptin maintenance    CHIEF COMPLIANT: Cycle 1 day 8 TCH Perjeta  INTERVAL HISTORY: [name] [name] is a 54 year old with above-mentioned history left-sided breast cancer who is currently on neoadjuvant chemotherapy with Ina. Today cycle 1 day 8. After cycle when she had perfuse and protracted migraine episode for which she received treatment with our nurse practitioner with slight improvement but her symptoms relapsed and she has profound headache that has completely disabled her. She also has nausea it is unclear if the nausea is related to migraine or chemotherapy. She also has diarrhea related to Perjeta for which she is taking Imodium. She takes 3-4 Imodium per day  and she has 3-4 loose stools per day. She has been trying hard to drink water but she feels very fatigued today.  REVIEW OF SYSTEMS:   Constitutional: Denies fevers, chills or abnormal weight loss Eyes: Denies blurriness of vision Ears, nose, mouth, throat, and face: Denies mucositis or sore throat Respiratory: Denies cough, dyspnea or wheezes Cardiovascular: Denies palpitation, chest discomfort or lower extremity swelling Gastrointestinal: Nausea and diarrhea Skin: Denies abnormal skin rashes Lymphatics: Denies new lymphadenopathy or easy bruising Neurological: Severe migraines Behavioral/Psych: Mood is stable, no new changes  All other systems were reviewed with the patient and are negative.  I have reviewed the past medical history, past surgical history, social history and family history with the patient and they are unchanged from previous note.  ALLERGIES:  is allergic to butorphanol; meperidine; and sulfa antibiotics.  MEDICATIONS:  Current Outpatient Prescriptions  Medication Sig Dispense Refill  . acetaminophen (TYLENOL) 325 MG tablet Take 650 mg by mouth every 6 (six) hours as needed.    . Aspirin-Acetaminophen-Caffeine (GOODYS EXTRA STRENGTH PO) Take by mouth.    Marland Kitchen atenolol-chlorthalidone (TENORETIC) 50-25 MG per tablet Take by mouth.    . B Complex-Biotin-FA (B-COMPLEX PO) Take by mouth.    Marland Kitchen BLACK COHOSH PO Take by mouth.    . Calcium Carbonate (CALCIUM 600 PO) Take by mouth.    . dexamethasone (DECADRON) 4 MG tablet Take 1 tablet (4 mg total) by mouth 2 (two) times daily. Start the day before Taxotere. Then again the day after chemo for 3 days. 30 tablet 1  . eletriptan (RELPAX) 40 MG tablet Take 1 tablet (40 mg total) by mouth every 2 (two)  hours as needed for migraine or headache (Max 2 per day). 10 tablet 3  . EVENING PRIMROSE OIL PO Take 1,300 mg by mouth.    Marland Kitchen ibuprofen (ADVIL,MOTRIN) 100 MG tablet Take 100 mg by mouth every 6 (six) hours as needed for fever.    .  Lactobacillus (ACIDOPHILUS PO) Take by mouth.    . lidocaine-prilocaine (EMLA) cream Apply to affected area once 30 g 3  . LORazepam (ATIVAN) 0.5 MG tablet Take 1 tablet (0.5 mg total) by mouth every 6 (six) hours as needed (Nausea or vomiting). 30 tablet 0  . ondansetron (ZOFRAN) 8 MG tablet Take 1 tablet (8 mg total) by mouth 2 (two) times daily. Start the day after chemo for 3 days. Then take as needed for nausea or vomiting. 30 tablet 1  . oxyCODONE-acetaminophen (ROXICET) 5-325 MG per tablet Take 1 tablet by mouth every 4 (four) hours as needed. 30 tablet 0  . PARoxetine (PAXIL) 20 MG tablet Take 20 mg by mouth.    . prochlorperazine (COMPAZINE) 10 MG tablet Take 1 tablet (10 mg total) by mouth every 6 (six) hours as needed (Nausea or vomiting). 30 tablet 1  . promethazine (PHENERGAN) 12.5 MG tablet Take 1 tablet (12.5 mg total) by mouth every 6 (six) hours as needed for nausea or vomiting. 30 tablet 0  . solifenacin (VESICARE) 5 MG tablet Take 10 mg by mouth.    Marland Kitchen VITAMIN D, CHOLECALCIFEROL, PO Take by mouth.    . promethazine (PHENERGAN) 25 MG suppository Place 1 suppository (25 mg total) rectally 2 (two) times daily as needed for nausea (if unable to swallow oral anti-emetics). 60 suppository 1   No current facility-administered medications for this visit.    PHYSICAL EXAMINATION: ECOG PERFORMANCE STATUS: 1 - Symptomatic but completely ambulatory  Filed Vitals:   11/08/14 0905  BP: 105/58  Pulse: 83  Temp: 97.7 F (36.5 C)  Resp: 18   Filed Weights   11/08/14 0905  Weight: 107 lb 9.6 oz (48.807 kg)    GENERAL:alert, no distress and comfortable SKIN: skin color, texture, turgor are normal, no rashes or significant lesions EYES: normal, Conjunctiva are pink and non-injected, sclera clear OROPHARYNX:no exudate, no erythema and lips, buccal mucosa, and tongue normal  NECK: supple, thyroid normal size, non-tender, without nodularity LYMPH:  no palpable lymphadenopathy in the  cervical, axillary or inguinal LUNGS: clear to auscultation and percussion with normal breathing effort HEART: regular rate & rhythm and no murmurs and no lower extremity edema ABDOMEN:abdomen soft, non-tender and normal bowel sounds Musculoskeletal:no cyanosis of digits and no clubbing  NEURO: alert & oriented x 3 with fluent speech, no focal motor/sensory deficits  LABORATORY DATA:  I have reviewed the data as listed   Chemistry      Component Value Date/Time   NA 138 11/08/2014 0849   K 4.0 11/08/2014 0849   CO2 24 11/08/2014 0849   BUN 5.9* 11/08/2014 0849   CREATININE 0.8 11/08/2014 0849      Component Value Date/Time   CALCIUM 9.1 11/08/2014 0849   ALKPHOS 78 11/08/2014 0849   AST 28 11/08/2014 0849   ALT 39 11/08/2014 0849   BILITOT 0.29 11/08/2014 0849       Lab Results  Component Value Date   WBC 10.2 11/08/2014   HGB 13.9 11/08/2014   HCT 40.6 11/08/2014   MCV 89.4 11/08/2014   PLT 160 11/08/2014   NEUTROABS 2.8 11/06/2014    ASSESSMENT & PLAN:  Breast cancer of  upper-outer quadrant of left female breast 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 Severe Migraine after first cycle of treatment: Continues to be a major problem seeing a neurologist today 2. Severe nausea but no vomiting 3. Diarrhea 3 episodes every day of liquid watery stools did get better with Imodium 4. Inability to eat because of nausea  I prescribed Phenergan suppositories We will  bring her back at 2 PM for IV fluids with Phenergan I will discontinue Aloxi from future chemotherapy treatments Add Emend to next cycle of chemotherapy Add Aloxi as premedication for next cycle of chemotherapy  Her blood counts were reviewed and they do not have any evidence of leukopenia or neutropenia.  Return to clinic in 2 weeks for cycle 2       No orders of the defined types were placed in this encounter.   The patient has a good understanding of the overall plan. she agrees with it. she will call with any problems that may develop before the next visit here.   Rulon Eisenmenger, MD

## 2014-11-08 NOTE — Telephone Encounter (Signed)
Elasha had called and LM on my VM.  I called back and left another message on her VM with good news.  Asked that she call back for results.

## 2014-11-08 NOTE — Telephone Encounter (Signed)
TC from pt's husband stating that patient has just come from Neurologist office and received an injection for her severe migraine. She is sound asleep at home and will not be able to come for 2pm appt for IVF's as scheduled. Husband would like to re-schedule for tomorrow.  Made infusion room aware of cancellation and received appt for 8:30 am tomorrow morning (11/09/14) for IVf's and phenergan.  Call made to husband and let him know about appt tomorrow. He verbalized understanding.

## 2014-11-08 NOTE — Telephone Encounter (Signed)
error 

## 2014-11-08 NOTE — Telephone Encounter (Signed)
Called patient and she is aware of her new appointment time on 5/27

## 2014-11-08 NOTE — Assessment & Plan Note (Signed)
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 Migraine after first cycle of treatment: Improved with supportive care  Return to clinic in 2 weeks for cycle 2

## 2014-11-09 ENCOUNTER — Telehealth: Payer: Self-pay | Admitting: *Deleted

## 2014-11-09 ENCOUNTER — Ambulatory Visit: Payer: PRIVATE HEALTH INSURANCE

## 2014-11-09 NOTE — Telephone Encounter (Signed)
Called pt on both home and cell phones unsuccessfully.  Left messages on both voice mail asking pt to call infusion room nurses.

## 2014-11-13 ENCOUNTER — Telehealth: Payer: Self-pay

## 2014-11-13 NOTE — Telephone Encounter (Signed)
Call Reports rcvd from [facility].  Sent to scan.

## 2014-11-13 NOTE — Telephone Encounter (Signed)
Results of Ultrasound and Mammogram dtd 10/24/14 received from [place].  Reviewed by Dr. Lindi Adie, Sent to scan.

## 2014-11-13 NOTE — Telephone Encounter (Signed)
Office Notes received from [facility] x 2 dtd 11/08/14 Dr. Marijean Bravo.  Reviewed by Dr. Lindi Adie, Sent to scan.

## 2014-11-15 ENCOUNTER — Telehealth: Payer: Self-pay | Admitting: *Deleted

## 2014-11-15 ENCOUNTER — Ambulatory Visit (HOSPITAL_BASED_OUTPATIENT_CLINIC_OR_DEPARTMENT_OTHER): Payer: PRIVATE HEALTH INSURANCE | Admitting: Nurse Practitioner

## 2014-11-15 ENCOUNTER — Ambulatory Visit (HOSPITAL_BASED_OUTPATIENT_CLINIC_OR_DEPARTMENT_OTHER): Payer: PRIVATE HEALTH INSURANCE

## 2014-11-15 ENCOUNTER — Other Ambulatory Visit: Payer: Self-pay | Admitting: *Deleted

## 2014-11-15 VITALS — BP 109/45 | HR 77 | Temp 97.0°F | Resp 18 | Wt 108.0 lb

## 2014-11-15 DIAGNOSIS — N390 Urinary tract infection, site not specified: Secondary | ICD-10-CM

## 2014-11-15 DIAGNOSIS — R319 Hematuria, unspecified: Principal | ICD-10-CM

## 2014-11-15 DIAGNOSIS — C50412 Malignant neoplasm of upper-outer quadrant of left female breast: Secondary | ICD-10-CM

## 2014-11-15 LAB — URINALYSIS, MICROSCOPIC - CHCC
GLUCOSE UR CHCC: NEGATIVE mg/dL
Ketones: NEGATIVE mg/dL
SPECIFIC GRAVITY, URINE: 1.005 (ref 1.003–1.035)
pH: 6 (ref 4.6–8.0)

## 2014-11-15 LAB — COMPREHENSIVE METABOLIC PANEL (CC13)
ALBUMIN: 3.7 g/dL (ref 3.5–5.0)
ALT: 68 U/L — AB (ref 0–55)
ANION GAP: 12 meq/L — AB (ref 3–11)
AST: 53 U/L — ABNORMAL HIGH (ref 5–34)
Alkaline Phosphatase: 115 U/L (ref 40–150)
BUN: 12 mg/dL (ref 7.0–26.0)
CALCIUM: 8.6 mg/dL (ref 8.4–10.4)
CHLORIDE: 103 meq/L (ref 98–109)
CO2: 26 mEq/L (ref 22–29)
Creatinine: 0.7 mg/dL (ref 0.6–1.1)
EGFR: >90 mL/min/{1.73_m2} (ref >90)
Glucose: 92 mg/dl (ref 70–140)
POTASSIUM: 3.9 meq/L (ref 3.5–5.1)
Sodium: 141 mEq/L (ref 136–145)
Total Bilirubin: 0.28 mg/dL (ref 0.20–1.20)
Total Protein: 6.4 g/dL (ref 6.4–8.3)

## 2014-11-15 LAB — CBC WITH DIFFERENTIAL/PLATELET
BASO%: 0.3 % (ref 0.0–2.0)
BASOS ABS: 0.1 10*3/uL (ref 0.0–0.1)
EOS ABS: 0 10*3/uL (ref 0.0–0.5)
EOS%: 0 % (ref 0.0–7.0)
HEMATOCRIT: 37.9 % (ref 34.8–46.6)
HGB: 12.7 g/dL (ref 11.6–15.9)
LYMPH%: 10 % — AB (ref 14.0–49.7)
MCH: 30 pg (ref 25.1–34.0)
MCHC: 33.5 g/dL (ref 31.5–36.0)
MCV: 89.6 fL (ref 79.5–101.0)
MONO#: 0.9 10*3/uL (ref 0.1–0.9)
MONO%: 4.1 % (ref 0.0–14.0)
NEUT%: 85.6 % — AB (ref 38.4–76.8)
NEUTROS ABS: 19.5 10*3/uL — AB (ref 1.5–6.5)
PLATELETS: 154 10*3/uL (ref 145–400)
RBC: 4.23 10*6/uL (ref 3.70–5.45)
RDW: 13 % (ref 11.2–14.5)
WBC: 22.7 10*3/uL — AB (ref 3.9–10.3)
lymph#: 2.3 10*3/uL (ref 0.9–3.3)

## 2014-11-15 MED ORDER — CIPROFLOXACIN HCL 500 MG PO TABS: 500.0000 mg | ORAL_TABLET | Freq: Two times a day (BID) | ORAL | Status: DC

## 2014-11-15 NOTE — Telephone Encounter (Signed)
TC from patient stating that for the last 3 mornings she has experienced bladder spasms, a small amount of blood in her urine-small clots.  It tends to clear up some by afternoon but returns in the morning. Denies fever, denies foul smelling urine.  Has taken AZO for spasms.  Instructed her to increase po fluids and also will schedule her for labs, u/a & culture and appt with Selena Lesser, NP in [facility] this afternoon.  Pateint voiced understanding

## 2014-11-16 ENCOUNTER — Encounter: Payer: Self-pay | Admitting: Nurse Practitioner

## 2014-11-16 DIAGNOSIS — N390 Urinary tract infection, site not specified: Secondary | ICD-10-CM | POA: Insufficient documentation

## 2014-11-16 LAB — URINE CULTURE

## 2014-11-16 NOTE — Assessment & Plan Note (Addendum)
Patient received her first cycle of Taxotere/carboplatin/Herceptin/Perjeta chemotherapy on 11/01/2014.  WBC 22.7; and ANC 19.5.  Confirmed that pt received Neulasta injeciton after her chemotherapy for growth factor support.   She is scheduled for labs, visit, and her next chemotherapy on 11/22/2014.

## 2014-11-16 NOTE — Assessment & Plan Note (Signed)
Patient complaining of dysuria, frequency, and some bladder spasms.  She denies any flank pain or back pain.  She also denies any recent fevers or chills.  She reports that she started taking OTC AZO for bladder spasms; with some effectiveness.   On exam abdomen remains soft; with bowel sounds positive in all quads.  Nontender with palpation.  The urinalysis obtained with leukocytes moderate, WBC 21-50, and moderate bacteria.  Will prescribe patient Cipro antibiotics for treatment of UTI.  Also urine culture pending results.  Advised patient to call/return or go to the emergency department for any worsening symptoms whatsoever.

## 2014-11-16 NOTE — Progress Notes (Signed)
SYMPTOM MANAGEMENT CLINIC   HPI: [name] [name] 54 y.o. female diagnosed with breast cancer.  Currently undergoing Taxotere/carboplatin/Herceptin/Perjeta chemotherapy regimen.  Patient complaining of dysuria, frequency, and some bladder spasms.  She denies any flank pain or back pain.  She also denies any recent fevers or chills.  She reports that she started taking OTC AZO for bladder spasms; with some effectiveness.   Also, pt reports that her chronic migraine she was suffering with last week has completely resolved.    Urinary Tract Infection     ROS  Past Medical History  Diagnosis Date  . Osteopenia   . Migraines   . Neurogenic bladder   . Wears contact lenses   . Family history of breast cancer   . Family history of colon cancer   . Breast cancer of upper-outer quadrant of right female breast 10/11/2014  . Breast cancer 2016    ER-/PR-/Her2+    Past Surgical History  Procedure Laterality Date  . Vein ligation      both legs  . Breast surgery      breast [id number]  . Colonoscopy    . Upper gi endoscopy    . Portacath placement Right 10/29/2014    Procedure: INSERTION PORT-A-CATH WITH ULTRASOUND;  Surgeon: Erroll Luna, MD;  Location: [facility];  Service: General;  Laterality: Right;    has Breast cancer of upper-outer quadrant of left female breast; Family history of breast cancer; Family history of colon cancer; Migraine headache; Genetic testing; and UTI (urinary tract infection) on her problem list.    is allergic to butorphanol; meperidine; and sulfa antibiotics.    Medication List       This list is accurate as of: 11/15/14 11:59 PM.  Always use your most recent med list.               acetaminophen 325 MG tablet  Commonly known as:  TYLENOL  Take 650 mg by mouth every 6 (six) hours as needed.     ACIDOPHILUS PO  Take by mouth.     atenolol-chlorthalidone 50-25 MG per tablet  Commonly known as:  TENORETIC  Take by mouth.     B-COMPLEX PO  Take by mouth.     BLACK COHOSH PO  Take by mouth.     CALCIUM 600 PO  Take by mouth.     ciprofloxacin 500 MG tablet  Commonly known as:  CIPRO  Take 1 tablet (500 mg total) by mouth 2 (two) times daily.     dexamethasone 4 MG tablet  Commonly known as:  DECADRON  Take 1 tablet (4 mg total) by mouth 2 (two) times daily. Start the day before Taxotere. Then again the day after chemo for 3 days.     eletriptan 40 MG tablet  Commonly known as:  RELPAX  Take 1 tablet (40 mg total) by mouth every 2 (two) hours as needed for migraine or headache (Max 2 per day).     EVENING PRIMROSE OIL PO  Take 1,300 mg by mouth.     GOODYS EXTRA STRENGTH PO  Take by mouth.     ibuprofen 100 MG tablet  Commonly known as:  ADVIL,MOTRIN  Take 100 mg by mouth every 6 (six) hours as needed for fever.     lidocaine-prilocaine cream  Commonly known as:  EMLA  Apply to affected area once     LORazepam 0.5 MG tablet  Commonly known as:  ATIVAN  Take 1 tablet (  0.5 mg total) by mouth every 6 (six) hours as needed (Nausea or vomiting).     ondansetron 8 MG tablet  Commonly known as:  ZOFRAN  Take 1 tablet (8 mg total) by mouth 2 (two) times daily. Start the day after chemo for 3 days. Then take as needed for nausea or vomiting.     oxyCODONE-acetaminophen 5-325 MG per tablet  Commonly known as:  ROXICET  Take 1 tablet by mouth every 4 (four) hours as needed.     PARoxetine 20 MG tablet  Commonly known as:  PAXIL  Take 20 mg by mouth.     prochlorperazine 10 MG tablet  Commonly known as:  COMPAZINE  Take 1 tablet (10 mg total) by mouth every 6 (six) hours as needed (Nausea or vomiting).     promethazine 12.5 MG tablet  Commonly known as:  PHENERGAN  Take 1 tablet (12.5 mg total) by mouth every 6 (six) hours as needed for nausea or vomiting.     promethazine 25 MG suppository  Commonly known as:  PHENERGAN  Place 1 suppository (25 mg total) rectally 2 (two) times daily as  needed for nausea (if unable to swallow oral anti-emetics).     solifenacin 5 MG tablet  Commonly known as:  VESICARE  Take 10 mg by mouth.     VITAMIN D (CHOLECALCIFEROL) PO  Take by mouth.         PHYSICAL EXAMINATION  Oncology Vitals 11/15/2014 11/08/2014 11/06/2014 11/01/2014 11/01/2014 11/01/2014 11/01/2014  Height - 152 cm - - - - -  Weight 48.988 kg 48.807 kg 47.582 kg - - - -  Weight (lbs) 108 lbs 107 lbs 10 oz 104 lbs 14 oz - - - -  BMI (kg/m2) - 21.01 kg/m2 - - - - -  Temp 97 97.7 97.8 97.4 - 97.5 97.7  Pulse 77 83 88 65 61 66 68  Resp 18 18 16 16 16 19 18   SpO2 99 99 - 99 97 98 98  BSA (m2) - 1.44 m2 - - - - -   BP Readings from Last 3 Encounters:  11/15/14 109/45  11/08/14 105/58  11/06/14 100/64    Physical Exam  Constitutional: She is oriented to person, place, and time and well-developed, well-nourished, and in no distress. No distress.  HENT:  Head: Normocephalic and atraumatic.  Mouth/Throat: Oropharynx is clear and moist.  Eyes: Conjunctivae and EOM are normal. Pupils are equal, round, and reactive to light. Right eye exhibits no discharge. Left eye exhibits no discharge. No scleral icterus.  Neck: Normal range of motion.  Cardiovascular: Normal rate, regular rhythm, normal heart sounds and intact distal pulses.   Pulmonary/Chest: Effort normal. No respiratory distress.  Right upper chest Port-A-Cath intact; it appears to be healing well.  No evidence of infection.  Abdominal: Soft. She exhibits no distension and no mass. There is no tenderness. There is no rebound and no guarding.  Musculoskeletal: Normal range of motion. She exhibits no edema or tenderness.  Neurological: She is alert and oriented to person, place, and time. Gait normal.  Skin: Skin is warm and dry. No rash noted. No erythema. No pallor.  Psychiatric: Affect normal.  Nursing note and vitals reviewed.   LABORATORY DATA:. Appointment on 11/15/2014  Component Date Value Ref Range Status  .  WBC 11/15/2014 22.7* 3.9 - 10.3 10e3/uL Final  . NEUT# 11/15/2014 19.5* 1.5 - 6.5 10e3/uL Final  . HGB 11/15/2014 12.7  11.6 - 15.9 g/dL Final  .  HCT 11/15/2014 37.9  34.8 - 46.6 % Final  . Platelets 11/15/2014 154  145 - 400 10e3/uL Final  . MCV 11/15/2014 89.6  79.5 - 101.0 fL Final  . MCH 11/15/2014 30.0  25.1 - 34.0 pg Final  . MCHC 11/15/2014 33.5  31.5 - 36.0 g/dL Final  . RBC 11/15/2014 4.23  3.70 - 5.45 10e6/uL Final  . RDW 11/15/2014 13.0  11.2 - 14.5 % Final  . lymph# 11/15/2014 2.3  0.9 - 3.3 10e3/uL Final  . MONO# 11/15/2014 0.9  0.1 - 0.9 10e3/uL Final  . Eosinophils Absolute 11/15/2014 0.0  0.0 - 0.5 10e3/uL Final  . Basophils Absolute 11/15/2014 0.1  0.0 - 0.1 10e3/uL Final  . NEUT% 11/15/2014 85.6* 38.4 - 76.8 % Final  . LYMPH% 11/15/2014 10.0* 14.0 - 49.7 % Final  . MONO% 11/15/2014 4.1  0.0 - 14.0 % Final  . EOS% 11/15/2014 0.0  0.0 - 7.0 % Final  . BASO% 11/15/2014 0.3  0.0 - 2.0 % Final  . Sodium 11/15/2014 141  136 - 145 mEq/L Final  . Potassium 11/15/2014 3.9  3.5 - 5.1 mEq/L Final  . Chloride 11/15/2014 103  98 - 109 mEq/L Final  . CO2 11/15/2014 26  22 - 29 mEq/L Final  . Glucose 11/15/2014 92  70 - 140 mg/dl Final  . BUN 11/15/2014 12.0  7.0 - 26.0 mg/dL Final  . Creatinine 11/15/2014 0.7  0.6 - 1.1 mg/dL Final  . Total Bilirubin 11/15/2014 0.28  0.20 - 1.20 mg/dL Final  . Alkaline Phosphatase 11/15/2014 115  40 - 150 U/L Final  . AST 11/15/2014 53* 5 - 34 U/L Final  . ALT 11/15/2014 68* 0 - 55 U/L Final  . Total Protein 11/15/2014 6.4  6.4 - 8.3 g/dL Final  . Albumin 11/15/2014 3.7  3.5 - 5.0 g/dL Final  . Calcium 11/15/2014 8.6  8.4 - 10.4 mg/dL Final  . Anion Gap 11/15/2014 12* 3 - 11 mEq/L Final  . EGFR 11/15/2014 >90  >90 ml/min/1.73 m2 Final   eGFR is calculated using the CKD-EPI Creatinine Equation (2009)  . Glucose 11/15/2014 Negative  Negative mg/dL Final  . Bilirubin (Urine) 11/15/2014 Color Interference  Negative Final  . Ketones 11/15/2014  Negative  Negative mg/dL Final  . Specific Gravity, Urine 11/15/2014 1.005  1.003 - 1.035 Final  . Blood 11/15/2014 Color Interference  Negative Final  . pH 11/15/2014 6.0  4.6 - 8.0 Final  . Protein 11/15/2014 Color Interference  Negative- <30 mg/dL Final  . Urobilinogen, UR 11/15/2014 Color Interference  0.2 - 1 mg/dL Final  . Nitrite 11/15/2014 Color Interference  Negative Final  . Leukocyte Esterase 11/15/2014 Moderate  Negative Final  . RBC / HPF 11/15/2014 7-10  0 - 2 Final  . WBC, UA 11/15/2014 21-50  0 - 2 Final  . Bacteria, UA 11/15/2014 Moderate  Negative- Trace Final  . Epithelial Cells 11/15/2014 Few  Negative- Few Final     RADIOGRAPHIC STUDIES: No results found.  ASSESSMENT/PLAN:    Breast cancer of upper-outer quadrant of left female breast Patient received her first cycle of Taxotere/carboplatin/Herceptin/Perjeta chemotherapy on 11/01/2014.  WBC 22.7; and ANC 19.5.  Confirmed that pt received Neulasta injeciton after her chemotherapy for growth factor support.   She is scheduled for labs, visit, and her next chemotherapy on 11/22/2014.     UTI (urinary tract infection) Patient complaining of dysuria, frequency, and some bladder spasms.  She denies any flank pain or back pain.  She also denies any recent fevers or chills.  She reports that she started taking OTC AZO for bladder spasms; with some effectiveness.   On exam abdomen remains soft; with bowel sounds positive in all quads.  Nontender with palpation.  The urinalysis obtained with leukocytes moderate, WBC 21-50, and moderate bacteria.  Will prescribe patient Cipro antibiotics for treatment of UTI.  Also urine culture pending results.  Advised patient to call/return or go to the emergency department for any worsening symptoms whatsoever.    Patient stated understanding of all instructions; and was in agreement with this plan of care. The patient knows to call the clinic with any problems, questions or  concerns.   Review/collaboration with Dr. Lindi Adie regarding all aspects of patient's visit today.   Total time spent with patient was 25 minutes;  with greater than 75 percent of that time spent in face to face counseling regarding patient's symptoms,  and coordination of care and follow up.  Disclaimer: This note was dictated with voice recognition software. Similar sounding words can inadvertently be transcribed and may not be corrected upon review.   Drue Second, NP 11/16/2014

## 2014-11-18 ENCOUNTER — Telehealth: Payer: Self-pay | Admitting: *Deleted

## 2014-11-18 NOTE — Telephone Encounter (Signed)
TC to pt to check on status. Pt reports improvement of UTI dx 5/20. Still has some bladder spasms but overall improved. Advised pt of Urine Cx results.  Pt concerned about 2nd round of  Chemo on Friday and the S/E experienced the first round. Pt reports the migraine was intolerable. She is very anxious about having this again. Advised pt I would route this message to Dr. Lindi Adie and the colab nurse for review. Pt would like a call back to discuss a possible plan to prevent/treat Ha.

## 2014-11-20 ENCOUNTER — Other Ambulatory Visit: Payer: Self-pay | Admitting: Hematology and Oncology

## 2014-11-22 ENCOUNTER — Other Ambulatory Visit: Payer: Self-pay | Admitting: *Deleted

## 2014-11-22 ENCOUNTER — Ambulatory Visit (HOSPITAL_BASED_OUTPATIENT_CLINIC_OR_DEPARTMENT_OTHER): Payer: PRIVATE HEALTH INSURANCE | Admitting: Hematology and Oncology

## 2014-11-22 ENCOUNTER — Telehealth: Payer: Self-pay | Admitting: Hematology and Oncology

## 2014-11-22 ENCOUNTER — Ambulatory Visit (HOSPITAL_BASED_OUTPATIENT_CLINIC_OR_DEPARTMENT_OTHER): Payer: PRIVATE HEALTH INSURANCE

## 2014-11-22 ENCOUNTER — Other Ambulatory Visit: Payer: PRIVATE HEALTH INSURANCE

## 2014-11-22 ENCOUNTER — Other Ambulatory Visit (HOSPITAL_BASED_OUTPATIENT_CLINIC_OR_DEPARTMENT_OTHER): Payer: PRIVATE HEALTH INSURANCE

## 2014-11-22 VITALS — BP 105/62 | HR 60 | Temp 97.8°F | Resp 16

## 2014-11-22 VITALS — BP 93/44 | HR 75 | Temp 97.8°F | Resp 18 | Ht 60.0 in | Wt 109.5 lb

## 2014-11-22 DIAGNOSIS — C50412 Malignant neoplasm of upper-outer quadrant of left female breast: Secondary | ICD-10-CM

## 2014-11-22 DIAGNOSIS — Z5111 Encounter for antineoplastic chemotherapy: Secondary | ICD-10-CM | POA: Diagnosis not present

## 2014-11-22 DIAGNOSIS — Z5112 Encounter for antineoplastic immunotherapy: Secondary | ICD-10-CM | POA: Diagnosis not present

## 2014-11-22 DIAGNOSIS — R197 Diarrhea, unspecified: Secondary | ICD-10-CM | POA: Diagnosis not present

## 2014-11-22 DIAGNOSIS — R110 Nausea: Secondary | ICD-10-CM | POA: Diagnosis not present

## 2014-11-22 LAB — CBC WITH DIFFERENTIAL/PLATELET
BASO%: 0 % (ref 0.0–2.0)
BASOS ABS: 0 10*3/uL (ref 0.0–0.1)
EOS%: 0 % (ref 0.0–7.0)
Eosinophils Absolute: 0 10*3/uL (ref 0.0–0.5)
HCT: 34.4 % — ABNORMAL LOW (ref 34.8–46.6)
HEMOGLOBIN: 11.4 g/dL — AB (ref 11.6–15.9)
LYMPH#: 0.8 10*3/uL — AB (ref 0.9–3.3)
LYMPH%: 9.2 % — ABNORMAL LOW (ref 14.0–49.7)
MCH: 29.9 pg (ref 25.1–34.0)
MCHC: 33.1 g/dL (ref 31.5–36.0)
MCV: 90.3 fL (ref 79.5–101.0)
MONO#: 0.2 10*3/uL (ref 0.1–0.9)
MONO%: 2.4 % (ref 0.0–14.0)
NEUT#: 8.1 10*3/uL — ABNORMAL HIGH (ref 1.5–6.5)
NEUT%: 88.4 % — ABNORMAL HIGH (ref 38.4–76.8)
Platelets: 192 10*3/uL (ref 145–400)
RBC: 3.81 10*6/uL (ref 3.70–5.45)
RDW: 13.5 % (ref 11.2–14.5)
WBC: 9.1 10*3/uL (ref 3.9–10.3)

## 2014-11-22 LAB — COMPREHENSIVE METABOLIC PANEL (CC13)
ALK PHOS: 90 U/L (ref 40–150)
ALT: 76 U/L — ABNORMAL HIGH (ref 0–55)
ANION GAP: 10 meq/L (ref 3–11)
AST: 54 U/L — AB (ref 5–34)
Albumin: 3.7 g/dL (ref 3.5–5.0)
BILIRUBIN TOTAL: 0.37 mg/dL (ref 0.20–1.20)
BUN: 12.1 mg/dL (ref 7.0–26.0)
CHLORIDE: 108 meq/L (ref 98–109)
CO2: 23 meq/L (ref 22–29)
Calcium: 8.7 mg/dL (ref 8.4–10.4)
Creatinine: 0.7 mg/dL (ref 0.6–1.1)
EGFR: >90 mL/min/{1.73_m2} (ref >90)
Glucose: 168 mg/dl — ABNORMAL HIGH (ref 70–140)
Potassium: 3.7 mEq/L (ref 3.5–5.1)
Sodium: 142 mEq/L (ref 136–145)
TOTAL PROTEIN: 6.4 g/dL (ref 6.4–8.3)

## 2014-11-22 MED ORDER — DEXTROSE 5 % IV SOLN
75.0000 mg/m2 | Freq: Once | INTRAVENOUS | Status: AC
  Administered 2014-11-22: 110 mg via INTRAVENOUS
  Filled 2014-11-22: qty 11

## 2014-11-22 MED ORDER — TRASTUZUMAB CHEMO INJECTION 440 MG
6.0000 mg/kg | Freq: Once | INTRAVENOUS | Status: AC
  Administered 2014-11-22: 315 mg via INTRAVENOUS
  Filled 2014-11-22: qty 15

## 2014-11-22 MED ORDER — PROMETHAZINE HCL 25 MG/ML IJ SOLN
25.0000 mg | Freq: Every day | INTRAMUSCULAR | Status: DC
  Administered 2014-11-22: 25 mg via INTRAVENOUS
  Filled 2014-11-22: qty 1

## 2014-11-22 MED ORDER — PEGFILGRASTIM 6 MG/0.6ML ~~LOC~~ PSKT
6.0000 mg | PREFILLED_SYRINGE | Freq: Once | SUBCUTANEOUS | Status: AC
  Administered 2014-11-22: 6 mg via SUBCUTANEOUS
  Filled 2014-11-22: qty 0.6

## 2014-11-22 MED ORDER — SODIUM CHLORIDE 0.9 % IJ SOLN
10.0000 mL | INTRAMUSCULAR | Status: DC | PRN
  Administered 2014-11-22: 10 mL
  Filled 2014-11-22: qty 10

## 2014-11-22 MED ORDER — DIPHENHYDRAMINE HCL 25 MG PO CAPS
ORAL_CAPSULE | ORAL | Status: AC
  Filled 2014-11-22: qty 1

## 2014-11-22 MED ORDER — SODIUM CHLORIDE 0.9 % IV SOLN
Freq: Once | INTRAVENOUS | Status: AC
  Administered 2014-11-22: 10:00:00 via INTRAVENOUS
  Filled 2014-11-22: qty 5

## 2014-11-22 MED ORDER — ACETAMINOPHEN 325 MG PO TABS
650.0000 mg | ORAL_TABLET | Freq: Once | ORAL | Status: AC
  Administered 2014-11-22: 650 mg via ORAL

## 2014-11-22 MED ORDER — HEPARIN SOD (PORK) LOCK FLUSH 100 UNIT/ML IV SOLN
500.0000 [IU] | Freq: Once | INTRAVENOUS | Status: AC | PRN
  Administered 2014-11-22: 500 [IU]
  Filled 2014-11-22: qty 5

## 2014-11-22 MED ORDER — PERTUZUMAB CHEMO INJECTION 420 MG/14ML
420.0000 mg | Freq: Once | INTRAVENOUS | Status: AC
  Administered 2014-11-22: 420 mg via INTRAVENOUS
  Filled 2014-11-22: qty 14

## 2014-11-22 MED ORDER — SODIUM CHLORIDE 0.9 % IV SOLN
539.4000 mg | Freq: Once | INTRAVENOUS | Status: AC
  Administered 2014-11-22: 540 mg via INTRAVENOUS
  Filled 2014-11-22: qty 54

## 2014-11-22 MED ORDER — SODIUM CHLORIDE 0.9 % IV SOLN
Freq: Once | INTRAVENOUS | Status: AC
  Administered 2014-11-22: 10:00:00 via INTRAVENOUS

## 2014-11-22 MED ORDER — DIPHENHYDRAMINE HCL 25 MG PO CAPS
25.0000 mg | ORAL_CAPSULE | Freq: Once | ORAL | Status: AC
  Administered 2014-11-22: 25 mg via ORAL

## 2014-11-22 MED ORDER — SODIUM CHLORIDE 0.9 % IV SOLN: Freq: Once | INTRAVENOUS | Status: DC

## 2014-11-22 MED ORDER — ACETAMINOPHEN 325 MG PO TABS
ORAL_TABLET | ORAL | Status: AC
  Filled 2014-11-22: qty 2

## 2014-11-22 NOTE — Telephone Encounter (Signed)
Appointments made and avs printed for patient,[name] [name] aware to call sickle cell for 5/31 ivf

## 2014-11-22 NOTE — Progress Notes (Signed)
Pt c/o "feeling funny" in her stomach and a little light headed about half way thru the Herceptin.   Paused Herceptin.  VSS.  Pt denied any nausea, chest pains, sob or itching.  She actually got up to the bathroom,  Came back and stated she feels ok and does not "feel funny in my stomach" anymore.   Restarted the Herceptin and instructed pt to report if she starts feeling different again or any other symptoms.  She verbalized understanding.

## 2014-11-22 NOTE — Patient Instructions (Signed)
[facility] Discharge Instructions for Patients Receiving Chemotherapy  Today you received the following chemotherapy agents Herceptin/Perjeta/Taxotere/Carboplatin  To help prevent nausea and vomiting after your treatment, we encourage you to take your nausea medication as prescribed.  If you develop nausea and vomiting that is not controlled by your nausea medication, call the clinic.   BELOW ARE SYMPTOMS THAT SHOULD BE REPORTED IMMEDIATELY:  *FEVER GREATER THAN 100.5 F  *CHILLS WITH OR WITHOUT FEVER  NAUSEA AND VOMITING THAT IS NOT CONTROLLED WITH YOUR NAUSEA MEDICATION  *UNUSUAL SHORTNESS OF BREATH  *UNUSUAL BRUISING OR BLEEDING  TENDERNESS IN MOUTH AND THROAT WITH OR WITHOUT PRESENCE OF ULCERS  *URINARY PROBLEMS  *BOWEL PROBLEMS  UNUSUAL RASH Items with * indicate a potential emergency and should be followed up as soon as possible.  Feel free to call the clinic you have any questions or concerns. The clinic phone number is [phone].  Please show the CHEMO ALERT CARD at check-in to the Emergency Department and triage nurse.   

## 2014-11-22 NOTE — Progress Notes (Signed)
Patient Care Team: Laray Anger, MD as PCP - General (Obstetrics and Gynecology) Erroll Luna, MD as Consulting Physician (General Surgery) Nicholas Lose, MD as Consulting Physician (Hematology and Oncology) Gery Pray, MD as Consulting Physician (Radiation Oncology) Mauro Kaufmann, RN as Registered Nurse Rockwell Germany, RN as Registered Nurse  DIAGNOSIS: Breast cancer of upper-outer quadrant of left female breast   Staging form: Breast, AJCC 7th Edition     Clinical stage from 10/16/2014: Stage IIA (T1c, N1, M0) - Unsigned   SUMMARY OF ONCOLOGIC HISTORY:   Breast cancer of upper-outer quadrant of left female breast   10/01/2014 Mammogram Left breast mammogram and ultrasound for palpable left breast mass which was a maxillary mass with an ill-defined abnormality by ultrasound 1.7 cm at 2:00   10/11/2014 Initial Diagnosis Left breast invasive ductal carcinoma, grade 3, ER/PR negative, 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 She had profound migraines for 2 weeks after cycle 1 and is ready his chemotherapy today. Finally her migraines improved with steroids. She does not have any migraine currently. She shaved her head off. Denies any nausea or vomiting. Does not have any diarrhea either.  REVIEW OF SYSTEMS:   Constitutional: Denies fevers, chills or abnormal weight loss Eyes: Denies blurriness of vision Ears, nose, mouth, throat, and face: Denies mucositis or sore  throat Respiratory: Denies cough, dyspnea or wheezes Cardiovascular: Denies palpitation, chest discomfort or lower extremity swelling Gastrointestinal:  Denies nausea, heartburn or change in bowel habits Skin: Denies abnormal skin rashes Lymphatics: Denies new lymphadenopathy or easy bruising Neurological:Denies numbness, tingling or new weaknesses Behavioral/Psych: Mood is stable, no new changes  Breast:  denies any pain or lumps or nodules in either breasts All other systems were reviewed with the patient and are negative.  I have reviewed the past medical history, past surgical history, social history and family history with the patient and they are unchanged from previous note.  ALLERGIES:  is allergic to butorphanol; meperidine; and sulfa antibiotics.  MEDICATIONS:  Current Outpatient Prescriptions  Medication Sig Dispense Refill  . acetaminophen (TYLENOL) 325 MG tablet Take 650 mg by mouth every 6 (six) hours as needed.    . Aspirin-Acetaminophen-Caffeine (GOODYS EXTRA STRENGTH PO) Take by mouth.    Marland Kitchen atenolol-chlorthalidone (TENORETIC) 50-25 MG per tablet Take by mouth.    . B Complex-Biotin-FA (B-COMPLEX PO) Take by mouth.    Marland Kitchen BLACK COHOSH PO Take by mouth.    . Calcium Carbonate (CALCIUM 600 PO) Take by mouth.    . ciprofloxacin (CIPRO) 500 MG tablet Take 1 tablet (500 mg total) by mouth 2 (two) times daily. 20 tablet 0  . dexamethasone (DECADRON) 4 MG tablet Take 1 tablet (4 mg total) by mouth 2 (two) times daily. Start the day before Taxotere. Then again the day after chemo for 3 days. 30 tablet 1  . Diclofenac Potassium (CAMBIA PO) Take by mouth daily as needed.    . eletriptan (RELPAX) 40 MG tablet Take 1 tablet (40 mg total) by  mouth every 2 (two) hours as needed for migraine or headache (Max 2 per day). 10 tablet 3  . EVENING PRIMROSE OIL PO Take 1,300 mg by mouth.    Marland Kitchen ibuprofen (ADVIL,MOTRIN) 100 MG tablet Take 100 mg by mouth every 6 (six) hours as needed for fever.     . Lactobacillus (ACIDOPHILUS PO) Take by mouth.    . lidocaine-prilocaine (EMLA) cream Apply to affected area once 30 g 3  . LORazepam (ATIVAN) 0.5 MG tablet Take 1 tablet (0.5 mg total) by mouth every 6 (six) hours as needed (Nausea or vomiting). 30 tablet 0  . ondansetron (ZOFRAN) 8 MG tablet Take 1 tablet (8 mg total) by mouth 2 (two) times daily. Start the day after chemo for 3 days. Then take as needed for nausea or vomiting. 30 tablet 1  . oxyCODONE-acetaminophen (ROXICET) 5-325 MG per tablet Take 1 tablet by mouth every 4 (four) hours as needed. 30 tablet 0  . PARoxetine (PAXIL) 20 MG tablet Take 20 mg by mouth.    . prochlorperazine (COMPAZINE) 10 MG tablet Take 1 tablet (10 mg total) by mouth every 6 (six) hours as needed (Nausea or vomiting). 30 tablet 1  . promethazine (PHENERGAN) 12.5 MG tablet Take 1 tablet (12.5 mg total) by mouth every 6 (six) hours as needed for nausea or vomiting. 30 tablet 0  . promethazine (PHENERGAN) 25 MG suppository Place 1 suppository (25 mg total) rectally 2 (two) times daily as needed for nausea (if unable to swallow oral anti-emetics). 60 suppository 1  . solifenacin (VESICARE) 5 MG tablet Take 10 mg by mouth.    Marland Kitchen VITAMIN D, CHOLECALCIFEROL, PO Take by mouth.     No current facility-administered medications for this visit.   Facility-Administered Medications Ordered in Other Visits  Medication Dose Route Frequency Provider Last Rate Last Dose  . 0.9 %  sodium chloride infusion   Intravenous Once Nicholas Lose, MD      . CARBOplatin (PARAPLATIN) 540 mg in sodium chloride 0.9 % 250 mL chemo infusion  540 mg Intravenous Once Nicholas Lose, MD      . DOCEtaxel (TAXOTERE) 110 mg in dextrose 5 % 250 mL chemo infusion  75 mg/m2 (Treatment Plan Actual) Intravenous Once Nicholas Lose, MD      . heparin lock flush 100 unit/mL  500 Units Intracatheter Once PRN Nicholas Lose, MD      . pegfilgrastim (NEULASTA ONPRO KIT) injection 6 mg  6 mg Subcutaneous Once Nicholas Lose, MD      . pertuzumab (PERJETA) 420 mg in sodium chloride 0.9 % 250 mL chemo infusion  420 mg Intravenous Once Nicholas Lose, MD      . promethazine (PHENERGAN) injection 25 mg  25 mg Intravenous Daily Nicholas Lose, MD   25 mg at 11/22/14 0957  . sodium chloride 0.9 % injection 10 mL  10 mL Intracatheter PRN Nicholas Lose, MD        PHYSICAL EXAMINATION: ECOG PERFORMANCE STATUS: 1 - Symptomatic but completely ambulatory  Filed Vitals:   11/22/14 0854  BP: 93/44  Pulse: 75  Temp: 97.8 F (36.6 C)  Resp: 18   Filed Weights   11/22/14 0854  Weight: 109 lb 8 oz (49.669 kg)    GENERAL:alert, no distress and comfortable SKIN: skin color, texture, turgor are normal, no rashes or significant lesions EYES: normal, Conjunctiva are pink and non-injected, sclera clear OROPHARYNX:no exudate, no erythema and lips, buccal mucosa, and tongue normal  NECK: supple, thyroid normal  size, non-tender, without nodularity LYMPH:  no palpable lymphadenopathy in the cervical, axillary or inguinal LUNGS: clear to auscultation and percussion with normal breathing effort HEART: regular rate & rhythm and no murmurs and no lower extremity edema ABDOMEN:abdomen soft, non-tender and normal bowel sounds Musculoskeletal:no cyanosis of digits and no clubbing  NEURO: alert & oriented x 3 with fluent speech, no focal motor/sensory deficits  LABORATORY DATA:  I have reviewed the data as listed   Chemistry      Component Value Date/Time   NA 142 11/22/2014 0840   K 3.7 11/22/2014 0840   CO2 23 11/22/2014 0840   BUN 12.1 11/22/2014 0840   CREATININE 0.7 11/22/2014 0840      Component Value Date/Time   CALCIUM 8.7 11/22/2014 0840   ALKPHOS 90 11/22/2014 0840   AST 54* 11/22/2014 0840   ALT 76* 11/22/2014 0840   BILITOT 0.37 11/22/2014 0840       Lab Results  Component Value Date   WBC 9.1 11/22/2014   HGB 11.4* 11/22/2014   HCT 34.4* 11/22/2014   MCV 90.3 11/22/2014   PLT 192 11/22/2014    NEUTROABS 8.1* 11/22/2014   ASSESSMENT & PLAN:  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 Severe Migraine after first cycle of treatment: saw neurologist; it was excruciating pain that lasted for 2 weeks apparently responded to steroids 2. Severe nausea but no vomiting related to migraine 3. Diarrhea 3 episodes every day of liquid watery stools did get better with Imodium 4. Inability to eat because of nausea  Discontinued Aloxi from future chemotherapy treatments Added Emend to next cycle of chemotherapy Plan to have hydration and Phenergan day 4 of each cycle Her blood counts were reviewed and they're adequate for treatment  Return to clinic in 1 week for toxicity check    No orders of the defined types were placed in this encounter.   The patient has a good understanding of the overall plan. she agrees with it. she will call with any problems that may develop before the next visit here.   Rulon Eisenmenger, MD

## 2014-11-22 NOTE — Assessment & Plan Note (Signed)
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 Severe Migraine after first cycle of treatment: saw neurologist; it was excruciating pain that lasted for 2 weeks apparently responded to steroids 2. Severe nausea but no vomiting related to migraine 3. Diarrhea 3 episodes every day of liquid watery stools did get better with Imodium 4. Inability to eat because of nausea  Discontinued Aloxi from future chemotherapy treatments Added Emend to next cycle of chemotherapy Plan to have hydration and Phenergan day 4 of each cycle Her blood counts were reviewed and they're adequate for treatment  Return to clinic in 1 week for toxicity check

## 2014-11-26 ENCOUNTER — Other Ambulatory Visit: Payer: Self-pay

## 2014-11-26 ENCOUNTER — Ambulatory Visit (HOSPITAL_COMMUNITY)
Admission: RE | Admit: 2014-11-26 | Discharge: 2014-11-26 | Disposition: A | Discharge status: Home / Self Care | Payer: PRIVATE HEALTH INSURANCE | Source: Ambulatory Visit | Attending: Hematology and Oncology | Admitting: Hematology and Oncology

## 2014-11-26 DIAGNOSIS — C50412 Malignant neoplasm of upper-outer quadrant of left female breast: Secondary | ICD-10-CM | POA: Diagnosis not present

## 2014-11-26 MED ORDER — SODIUM CHLORIDE 0.9 % IV SOLN
INTRAVENOUS | Status: AC
  Administered 2014-11-26: 10:00:00 via INTRAVENOUS

## 2014-11-26 MED ORDER — HEPARIN SOD (PORK) LOCK FLUSH 100 UNIT/ML IV SOLN
500.0000 [IU] | INTRAVENOUS | Status: AC | PRN
  Administered 2014-11-26: 500 [IU]
  Filled 2014-11-26: qty 5

## 2014-11-26 MED ORDER — SODIUM CHLORIDE 0.9 % IJ SOLN
10.0000 mL | INTRAMUSCULAR | Status: AC | PRN
  Administered 2014-11-26: 10 mL

## 2014-11-26 NOTE — Progress Notes (Signed)
Pt arrived to [facility] to receive fluids. Port-a-cath was accessed, good blood return noted. A small stitch was noted at the right side corner of a scar. Scar was intact. Pt stated she called MD and was going to check it out today at noon. There was no difficulty in accessing the port. Pt received fluids as ordered. Tolerated well. Port was flushed with heparin upon completion.  Pt is ambulatory at discharge. Discharged to home.  [name] [name]  [facility].

## 2014-11-29 ENCOUNTER — Ambulatory Visit (HOSPITAL_BASED_OUTPATIENT_CLINIC_OR_DEPARTMENT_OTHER): Payer: PRIVATE HEALTH INSURANCE | Admitting: Hematology and Oncology

## 2014-11-29 ENCOUNTER — Other Ambulatory Visit (HOSPITAL_BASED_OUTPATIENT_CLINIC_OR_DEPARTMENT_OTHER): Payer: PRIVATE HEALTH INSURANCE

## 2014-11-29 ENCOUNTER — Encounter: Payer: Self-pay | Admitting: Genetic Counselor

## 2014-11-29 VITALS — BP 88/53 | HR 81 | Temp 98.5°F | Resp 18 | Ht 60.0 in | Wt 103.4 lb

## 2014-11-29 DIAGNOSIS — G43909 Migraine, unspecified, not intractable, without status migrainosus: Secondary | ICD-10-CM | POA: Diagnosis not present

## 2014-11-29 DIAGNOSIS — R110 Nausea: Secondary | ICD-10-CM

## 2014-11-29 DIAGNOSIS — C773 Secondary and unspecified malignant neoplasm of axilla and upper limb lymph nodes: Secondary | ICD-10-CM

## 2014-11-29 DIAGNOSIS — C50412 Malignant neoplasm of upper-outer quadrant of left female breast: Secondary | ICD-10-CM

## 2014-11-29 DIAGNOSIS — Z800 Family history of malignant neoplasm of digestive organs: Secondary | ICD-10-CM

## 2014-11-29 DIAGNOSIS — D696 Thrombocytopenia, unspecified: Secondary | ICD-10-CM | POA: Diagnosis not present

## 2014-11-29 DIAGNOSIS — Z1379 Encounter for other screening for genetic and chromosomal anomalies: Secondary | ICD-10-CM

## 2014-11-29 DIAGNOSIS — Z803 Family history of malignant neoplasm of breast: Secondary | ICD-10-CM

## 2014-11-29 DIAGNOSIS — Z171 Estrogen receptor negative status [ER-]: Secondary | ICD-10-CM

## 2014-11-29 DIAGNOSIS — R197 Diarrhea, unspecified: Secondary | ICD-10-CM

## 2014-11-29 LAB — CBC WITH DIFFERENTIAL/PLATELET
BASO%: 0.3 % (ref 0.0–2.0)
Basophils Absolute: 0 10*3/uL (ref 0.0–0.1)
EOS%: 0.4 % (ref 0.0–7.0)
Eosinophils Absolute: 0 10*3/uL (ref 0.0–0.5)
HCT: 33 % — ABNORMAL LOW (ref 34.8–46.6)
HEMOGLOBIN: 11 g/dL — AB (ref 11.6–15.9)
LYMPH#: 1.4 10*3/uL (ref 0.9–3.3)
LYMPH%: 18.8 % (ref 14.0–49.7)
MCH: 29.6 pg (ref 25.1–34.0)
MCHC: 33.4 g/dL (ref 31.5–36.0)
MCV: 88.6 fL (ref 79.5–101.0)
MONO#: 0.8 10*3/uL (ref 0.1–0.9)
MONO%: 10.8 % (ref 0.0–14.0)
NEUT#: 5.3 10*3/uL (ref 1.5–6.5)
NEUT%: 69.7 % (ref 38.4–76.8)
PLATELETS: 105 10*3/uL — AB (ref 145–400)
RBC: 3.72 10*6/uL (ref 3.70–5.45)
RDW: 13.3 % (ref 11.2–14.5)
WBC: 7.7 10*3/uL (ref 3.9–10.3)

## 2014-11-29 LAB — COMPREHENSIVE METABOLIC PANEL (CC13)
ALT: 91 U/L — ABNORMAL HIGH (ref 0–55)
AST: 42 U/L — ABNORMAL HIGH (ref 5–34)
Albumin: 3.8 g/dL (ref 3.5–5.0)
Alkaline Phosphatase: 104 U/L (ref 40–150)
Anion Gap: 8 mEq/L (ref 3–11)
BUN: 11.9 mg/dL (ref 7.0–26.0)
CO2: 30 mEq/L — ABNORMAL HIGH (ref 22–29)
Calcium: 9.2 mg/dL (ref 8.4–10.4)
Chloride: 101 mEq/L (ref 98–109)
Creatinine: 0.7 mg/dL (ref 0.6–1.1)
EGFR: >90 mL/min/{1.73_m2} (ref >90)
Glucose: 99 mg/dl (ref 70–140)
Potassium: 3.8 mEq/L (ref 3.5–5.1)
Sodium: 139 mEq/L (ref 136–145)
Total Bilirubin: 0.42 mg/dL (ref 0.20–1.20)
Total Protein: 6.3 g/dL — ABNORMAL LOW (ref 6.4–8.3)

## 2014-11-29 MED ORDER — DEXAMETHASONE 4 MG PO TABS: 4.0000 mg | ORAL_TABLET | Freq: Two times a day (BID) | ORAL | Status: DC

## 2014-11-29 MED ORDER — PROMETHAZINE HCL 12.5 MG PO TABS: 12.5000 mg | ORAL_TABLET | Freq: Four times a day (QID) | ORAL | Status: AC | PRN

## 2014-11-29 NOTE — Progress Notes (Signed)
Patient Care Team: Laray Anger, MD as PCP - General (Obstetrics and Gynecology) Erroll Luna, MD as Consulting Physician (General Surgery) Nicholas Lose, MD as Consulting Physician (Hematology and Oncology) Gery Pray, MD as Consulting Physician (Radiation Oncology) Mauro Kaufmann, RN as Registered Nurse Rockwell Germany, RN as Registered Nurse  DIAGNOSIS: Breast cancer of upper-outer quadrant of left female breast   Staging form: Breast, AJCC 7th Edition     Clinical stage from 10/16/2014: Stage IIA (T1c, N1, M0) - Unsigned   SUMMARY OF ONCOLOGIC HISTORY:   Breast cancer of upper-outer quadrant of left female breast   10/01/2014 Mammogram Left breast mammogram and ultrasound for palpable left breast mass which was a maxillary mass with an ill-defined abnormality by ultrasound 1.7 cm at 2:00   10/11/2014 Initial Diagnosis Left breast invasive ductal carcinoma, grade 3, ER/PR negative, 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 5.32, axillary lymph node also positive for cancer   10/30/2014 Imaging CT chest abdomen pelvis and bone scan revealed no evidence of distant metastatic disease but enlarged left axillary and left subpectoral lymph nodes   11/01/2014 -  Neo-Adjuvant Chemotherapy Neoadjuvant TCH Perjeta to 3 weeks 6 cycles followed by Herceptin maintenance    CHIEF COMPLIANT: cycle 2 day 8 neo-adjuvant TCH Perjeta  INTERVAL HISTORY: [name] [name] is a 54 year old with above-mentioned history of left breast cancer currently on U adjuvant chemotherapy. Last week she received cycle 2 of treatment and had done well until last couple of days when she started to have migraine headaches again. She is currently under the care of her neurologist for the migraines and it appears that overall compared to cycle one she has done much better but she continues to have intractable migraines morning. The nausea that is associated with the migraines is improved with Phenergan. She reports that the dexamethasone appears to control  her migraines as well. Went to dexamethasone was discontinued that's when the migraine started.  REVIEW OF SYSTEMS:   Constitutional: Denies fevers, chills or abnormal weight loss Eyes: Denies blurriness of vision Ears, nose, mouth, throat, and face: migraines Respiratory: Denies cough, dyspnea or wheezes Cardiovascular: Denies palpitation, chest discomfort or lower extremity swelling Gastrointestinal:  Nausea and vomiting due to migraine Skin: Denies abnormal skin rashes Lymphatics: Denies new lymphadenopathy or easy bruising Neurological:Denies numbness, tingling or new weaknesses Behavioral/Psych: Mood is stable, no new changes  All other systems were reviewed with the patient and are negative.  I have reviewed the past medical history, past surgical history, social history and family history with the patient and they are unchanged from previous note.  ALLERGIES:  is allergic to butorphanol; meperidine; and sulfa antibiotics.  MEDICATIONS:  Current Outpatient Prescriptions  Medication Sig Dispense Refill  . acetaminophen (TYLENOL) 325 MG tablet Take 650 mg by mouth every 6 (six) hours as needed.    . Aspirin-Acetaminophen-Caffeine (GOODYS EXTRA STRENGTH PO) Take by mouth.    Marland Kitchen atenolol-chlorthalidone (TENORETIC) 50-25 MG per tablet Take by mouth.    . B Complex-Biotin-FA (B-COMPLEX PO) Take by mouth.    Marland Kitchen BLACK COHOSH PO Take by mouth.    . Calcium Carbonate (CALCIUM 600 PO) Take by mouth.    . dexamethasone (DECADRON) 4 MG tablet Take 1 tablet (4 mg total) by mouth 2 (two) times daily. Start the day before Taxotere. Then again the day after chemo for 3 days. 30 tablet 1  . Diclofenac Potassium (CAMBIA PO) Take by mouth daily as needed.    . eletriptan (RELPAX) 40 MG tablet Take  1 tablet (40 mg total) by mouth every 2 (two) hours as needed for migraine or headache (Max 2 per day). 10 tablet 3  . EVENING PRIMROSE OIL PO Take 1,300 mg by mouth.    Marland Kitchen ibuprofen (ADVIL,MOTRIN) 100 MG  tablet Take 100 mg by mouth every 6 (six) hours as needed for fever.    . Lactobacillus (ACIDOPHILUS PO) Take by mouth.    . lidocaine-prilocaine (EMLA) cream Apply to affected area once 30 g 3  . LORazepam (ATIVAN) 0.5 MG tablet Take 1 tablet (0.5 mg total) by mouth every 6 (six) hours as needed (Nausea or vomiting). 30 tablet 0  . oxyCODONE-acetaminophen (ROXICET) 5-325 MG per tablet Take 1 tablet by mouth every 4 (four) hours as needed. 30 tablet 0  . PARoxetine (PAXIL) 20 MG tablet Take 20 mg by mouth.    . promethazine (PHENERGAN) 12.5 MG tablet Take 1 tablet (12.5 mg total) by mouth every 6 (six) hours as needed for nausea or vomiting. 90 tablet 3  . solifenacin (VESICARE) 5 MG tablet Take 10 mg by mouth.    Marland Kitchen VITAMIN D, CHOLECALCIFEROL, PO Take by mouth.    . ondansetron (ZOFRAN) 8 MG tablet Take 1 tablet (8 mg total) by mouth 2 (two) times daily. Start the day after chemo for 3 days. Then take as needed for nausea or vomiting. (Patient not taking: Reported on 11/29/2014) 30 tablet 1  . prochlorperazine (COMPAZINE) 10 MG tablet Take 1 tablet (10 mg total) by mouth every 6 (six) hours as needed (Nausea or vomiting). (Patient not taking: Reported on 11/29/2014) 30 tablet 1   No current facility-administered medications for this visit.    PHYSICAL EXAMINATION: ECOG PERFORMANCE STATUS: 1 - Symptomatic but completely ambulatory  Filed Vitals:   11/29/14 1159  BP: 88/53  Pulse: 81  Temp: 98.5 F (36.9 C)  Resp: 18   Filed Weights   11/29/14 1159  Weight: 103 lb 6.4 oz (46.902 kg)    GENERAL:alert, no distress and comfortable SKIN: skin color, texture, turgor are normal, no rashes or significant lesions EYES: normal, Conjunctiva are pink and non-injected, sclera clear OROPHARYNX:no exudate, no erythema and lips, buccal mucosa, and tongue normal  NECK: supple, thyroid normal size, non-tender, without nodularity LYMPH:  no palpable lymphadenopathy in the cervical, axillary or  inguinal LUNGS: clear to auscultation and percussion with normal breathing effort HEART: regular rate & rhythm and no murmurs and no lower extremity edema ABDOMEN:abdomen soft, non-tender and normal bowel sounds Musculoskeletal:no cyanosis of digits and no clubbing  NEURO: severe migraines   LABORATORY DATA:  I have reviewed the data as listed   Chemistry      Component Value Date/Time   NA 139 11/29/2014 1141   K 3.8 11/29/2014 1141   CO2 30* 11/29/2014 1141   BUN 11.9 11/29/2014 1141   CREATININE 0.7 11/29/2014 1141      Component Value Date/Time   CALCIUM 9.2 11/29/2014 1141   ALKPHOS 104 11/29/2014 1141   AST 42* 11/29/2014 1141   ALT 91* 11/29/2014 1141   BILITOT 0.42 11/29/2014 1141       Lab Results  Component Value Date   WBC 7.7 11/29/2014   HGB 11.0* 11/29/2014   HCT 33.0* 11/29/2014   MCV 88.6 11/29/2014   PLT 105* 11/29/2014   NEUTROABS 5.3 11/29/2014   ASSESSMENT & PLAN:  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 Severe Migraine after first cycle of treatment: saw neurologist; it was excruciating pain that lasted for 2 weeks apparently responded to steroids 2. Severe nausea but no vomiting related to migraine 3. Diarrhea 3 episodes every day of liquid watery stools did get better with Imodium 4. Inability to eat because of nausea  Discontinued Aloxi from future chemotherapy  treatments Added Emend from cycle 2 of chemotherapy Giving her IV hydration and Phenergan day 4 of each cycle  Her blood counts were reviewed: Mildly thrombocytopenia being monitored  I renewed her Phenergan and dexamethasone prescriptions Return to clinic in 2 weeks for cycle 3, she will follow with the nurse practitioner    No orders of the defined types were placed in this encounter.   The patient has a good understanding of the overall plan. she agrees with it. she will call with any problems that may develop before the next visit here.   Rulon Eisenmenger, MD

## 2014-11-29 NOTE — Assessment & Plan Note (Signed)
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 Severe Migraine after first cycle of treatment: saw neurologist; it was excruciating pain that lasted for 2 weeks apparently responded to steroids 2. Severe nausea but no vomiting related to migraine 3. Diarrhea 3 episodes every day of liquid watery stools did get better with Imodium 4. Inability to eat because of nausea  Discontinued Aloxi from future chemotherapy treatments Added Emend from cycle 2 of chemotherapy Plan to have hydration and Phenergan day 4 of each cycle Her blood counts were reviewed  Return to clinic in 2 weeks for cycle 3, she will follow with the nurse practitioner

## 2014-11-29 NOTE — Progress Notes (Signed)
HPI: Ms. Mallette was previously seen in the [facility] due to a personal and family history of cancer and concerns regarding a hereditary predisposition to cancer. Please refer to our prior cancer genetics clinic note for more information regarding Ms. Azbell's medical, social and family histories, and our assessment and recommendations, at the time. Ms. Perine recent genetic test results were disclosed to her, as were recommendations warranted by these results. These results and recommendations are discussed in more detail below.  GENETIC TEST RESULTS: At the time of Ms. Pareja visit, we recommended she pursue genetic testing of the Comprehensive Cancer gene panel. The Comprehensive Cancer Panel offered by [facility] includes sequencing and/or deletion duplication testing of the following 32 genes: APC, ATM, AXIN2, BARD1, BMPR1A, BRCA1, BRCA2, BRIP1, CDH1, CDK4, CDKN2A, CHEK2, EPCAM, FANCC, MLH1, MSH2, MSH6, MUTYH, NBN, PALB2, PMS2, POLD1, POLE, PTEN, RAD51C, RAD51D, SCG5/GREM1, SMAD4, STK11, TP53, VHL, and XRCC2.    The report date is Nov 06, 2014.  Genetic testing was normal, and did not reveal a deleterious mutation in these genes. The test report has been scanned into EPIC and is located under the Media tab.   We discussed with Ms. Knights that since the current genetic testing is not perfect, it is possible there may be a gene mutation in one of these genes that current testing cannot detect, but that chance is small. We also discussed, that it is possible that another gene that has not yet been discovered, or that we have not yet tested, is responsible for the cancer diagnoses in the family, and it is, therefore, important to remain in touch with cancer genetics in the future so that we can continue to offer Ms. Clements the most up to date genetic testing.   CANCER SCREENING RECOMMENDATIONS: This result is reassuring and indicates that Ms. Hinesley likely does not have an  increased risk for a future cancer due to a mutation in one of these genes. This normal test also suggests that Ms. Cienfuegos's cancer was most likely not due to an inherited predisposition associated with one of these genes.  Most cancers happen by chance and this negative test suggests that her cancer falls into this category.  We, therefore, recommended she continue to follow the cancer management and screening guidelines provided by her oncology and primary healthcare provider.   RECOMMENDATIONS FOR FAMILY MEMBERS: Women in this family might be at some increased risk of developing cancer, over the general population risk, simply due to the family history of cancer. We recommended women in this family have a yearly mammogram beginning at age 97, or 18 years younger than the earliest onset of cancer, an an annual clinical breast exam, and perform monthly breast self-exams. Women in this family should also have a gynecological exam as recommended by their primary provider. All family members should have a colonoscopy by age 34.  FOLLOW-UP: Lastly, we discussed with Ms. Mandujano that cancer genetics is a rapidly advancing field and it is possible that new genetic tests will be appropriate for her and/or her family members in the future. We encouraged her to remain in contact with cancer genetics on an annual basis so we can update her personal and family histories and let her know of advances in cancer genetics that may benefit this family.   Our contact number was provided. Ms. Paulick questions were answered to her satisfaction, and she knows she is welcome to call us at anytime with additional questions or concerns.  Roma Kayser, MS, [facility] Certified Genetic Counselor [email or web address]

## 2014-12-13 ENCOUNTER — Ambulatory Visit (HOSPITAL_BASED_OUTPATIENT_CLINIC_OR_DEPARTMENT_OTHER): Payer: PRIVATE HEALTH INSURANCE | Admitting: Nurse Practitioner

## 2014-12-13 ENCOUNTER — Ambulatory Visit (HOSPITAL_BASED_OUTPATIENT_CLINIC_OR_DEPARTMENT_OTHER): Payer: PRIVATE HEALTH INSURANCE

## 2014-12-13 ENCOUNTER — Other Ambulatory Visit (HOSPITAL_BASED_OUTPATIENT_CLINIC_OR_DEPARTMENT_OTHER): Payer: PRIVATE HEALTH INSURANCE

## 2014-12-13 ENCOUNTER — Encounter: Payer: Self-pay | Admitting: Nurse Practitioner

## 2014-12-13 ENCOUNTER — Encounter: Payer: Self-pay | Admitting: *Deleted

## 2014-12-13 ENCOUNTER — Telehealth: Payer: Self-pay | Admitting: Nurse Practitioner

## 2014-12-13 VITALS — BP 103/59 | HR 76 | Temp 97.6°F | Resp 18 | Wt 105.5 lb

## 2014-12-13 DIAGNOSIS — C773 Secondary and unspecified malignant neoplasm of axilla and upper limb lymph nodes: Secondary | ICD-10-CM

## 2014-12-13 DIAGNOSIS — C50412 Malignant neoplasm of upper-outer quadrant of left female breast: Secondary | ICD-10-CM

## 2014-12-13 DIAGNOSIS — T451X5A Adverse effect of antineoplastic and immunosuppressive drugs, initial encounter: Secondary | ICD-10-CM

## 2014-12-13 DIAGNOSIS — K644 Residual hemorrhoidal skin tags: Secondary | ICD-10-CM | POA: Diagnosis not present

## 2014-12-13 DIAGNOSIS — Z5112 Encounter for antineoplastic immunotherapy: Secondary | ICD-10-CM

## 2014-12-13 DIAGNOSIS — R748 Abnormal levels of other serum enzymes: Secondary | ICD-10-CM

## 2014-12-13 DIAGNOSIS — R197 Diarrhea, unspecified: Secondary | ICD-10-CM

## 2014-12-13 DIAGNOSIS — D6481 Anemia due to antineoplastic chemotherapy: Secondary | ICD-10-CM | POA: Diagnosis not present

## 2014-12-13 DIAGNOSIS — Z5111 Encounter for antineoplastic chemotherapy: Secondary | ICD-10-CM | POA: Diagnosis not present

## 2014-12-13 DIAGNOSIS — R110 Nausea: Secondary | ICD-10-CM

## 2014-12-13 LAB — CBC WITH DIFFERENTIAL/PLATELET
BASO%: 0.2 % (ref 0.0–2.0)
Basophils Absolute: 0 10*3/uL (ref 0.0–0.1)
EOS%: 0 % (ref 0.0–7.0)
Eosinophils Absolute: 0 10*3/uL (ref 0.0–0.5)
HCT: 29.3 % — ABNORMAL LOW (ref 34.8–46.6)
HGB: 9.9 g/dL — ABNORMAL LOW (ref 11.6–15.9)
LYMPH%: 17.9 % (ref 14.0–49.7)
MCH: 30.7 pg (ref 25.1–34.0)
MCHC: 33.7 g/dL (ref 31.5–36.0)
MCV: 91.3 fL (ref 79.5–101.0)
MONO#: 0.4 10*3/uL (ref 0.1–0.9)
MONO%: 6.9 % (ref 0.0–14.0)
NEUT#: 4.7 10*3/uL (ref 1.5–6.5)
NEUT%: 75 % (ref 38.4–76.8)
Platelets: 318 10*3/uL (ref 145–400)
RBC: 3.21 10*6/uL — AB (ref 3.70–5.45)
RDW: 17.4 % — AB (ref 11.2–14.5)
WBC: 6.2 10*3/uL (ref 3.9–10.3)
lymph#: 1.1 10*3/uL (ref 0.9–3.3)

## 2014-12-13 LAB — COMPREHENSIVE METABOLIC PANEL (CC13)
ALBUMIN: 3.9 g/dL (ref 3.5–5.0)
ALT: 66 U/L — AB (ref 0–55)
AST: 38 U/L — ABNORMAL HIGH (ref 5–34)
Alkaline Phosphatase: 123 U/L (ref 40–150)
Anion Gap: 8 mEq/L (ref 3–11)
BUN: 12.8 mg/dL (ref 7.0–26.0)
CHLORIDE: 105 meq/L (ref 98–109)
CO2: 28 mEq/L (ref 22–29)
Calcium: 9.5 mg/dL (ref 8.4–10.4)
Creatinine: 0.7 mg/dL (ref 0.6–1.1)
EGFR: >90 mL/min/{1.73_m2} (ref >90)
Glucose: 125 mg/dl (ref 70–140)
POTASSIUM: 3.8 meq/L (ref 3.5–5.1)
Sodium: 141 mEq/L (ref 136–145)
Total Bilirubin: 0.43 mg/dL (ref 0.20–1.20)
Total Protein: 6.3 g/dL — ABNORMAL LOW (ref 6.4–8.3)

## 2014-12-13 MED ORDER — SODIUM CHLORIDE 0.9 % IV SOLN
INTRAVENOUS | Status: DC
  Administered 2014-12-13: 11:00:00 via INTRAVENOUS

## 2014-12-13 MED ORDER — PEGFILGRASTIM 6 MG/0.6ML ~~LOC~~ PSKT
6.0000 mg | PREFILLED_SYRINGE | Freq: Once | SUBCUTANEOUS | Status: AC
  Administered 2014-12-13: 6 mg via SUBCUTANEOUS
  Filled 2014-12-13: qty 0.6

## 2014-12-13 MED ORDER — HEPARIN SOD (PORK) LOCK FLUSH 100 UNIT/ML IV SOLN
500.0000 [IU] | Freq: Once | INTRAVENOUS | Status: AC | PRN
  Administered 2014-12-13: 500 [IU]
  Filled 2014-12-13: qty 5

## 2014-12-13 MED ORDER — PROMETHAZINE HCL 25 MG/ML IJ SOLN
25.0000 mg | Freq: Every day | INTRAMUSCULAR | Status: DC
  Administered 2014-12-13: 25 mg via INTRAVENOUS
  Filled 2014-12-13: qty 1

## 2014-12-13 MED ORDER — SODIUM CHLORIDE 0.9 % IV SOLN
539.4000 mg | Freq: Once | INTRAVENOUS | Status: AC
  Administered 2014-12-13: 540 mg via INTRAVENOUS
  Filled 2014-12-13: qty 54

## 2014-12-13 MED ORDER — HYDROCORTISONE 2.5 % EX OINT: TOPICAL_OINTMENT | Freq: Two times a day (BID) | CUTANEOUS | Status: AC

## 2014-12-13 MED ORDER — TRASTUZUMAB CHEMO INJECTION 440 MG
6.0000 mg/kg | Freq: Once | INTRAVENOUS | Status: AC
  Administered 2014-12-13: 315 mg via INTRAVENOUS
  Filled 2014-12-13: qty 15

## 2014-12-13 MED ORDER — DIPHENHYDRAMINE HCL 25 MG PO CAPS
ORAL_CAPSULE | ORAL | Status: AC
  Filled 2014-12-13: qty 1

## 2014-12-13 MED ORDER — PERTUZUMAB CHEMO INJECTION 420 MG/14ML
420.0000 mg | Freq: Once | INTRAVENOUS | Status: AC
  Administered 2014-12-13: 420 mg via INTRAVENOUS
  Filled 2014-12-13: qty 14

## 2014-12-13 MED ORDER — FOSAPREPITANT DIMEGLUMINE INJECTION 150 MG
Freq: Once | INTRAVENOUS | Status: AC
  Administered 2014-12-13: 13:00:00 via INTRAVENOUS
  Filled 2014-12-13: qty 5

## 2014-12-13 MED ORDER — ACETAMINOPHEN 325 MG PO TABS
ORAL_TABLET | ORAL | Status: AC
  Filled 2014-12-13: qty 2

## 2014-12-13 MED ORDER — DIPHENHYDRAMINE HCL 25 MG PO CAPS
25.0000 mg | ORAL_CAPSULE | Freq: Once | ORAL | Status: AC
  Administered 2014-12-13: 25 mg via ORAL

## 2014-12-13 MED ORDER — DOCETAXEL CHEMO INJECTION 160 MG/16ML
75.0000 mg/m2 | Freq: Once | INTRAVENOUS | Status: AC
  Administered 2014-12-13: 110 mg via INTRAVENOUS
  Filled 2014-12-13: qty 11

## 2014-12-13 MED ORDER — SODIUM CHLORIDE 0.9 % IJ SOLN
10.0000 mL | INTRAMUSCULAR | Status: DC | PRN
  Administered 2014-12-13: 10 mL
  Filled 2014-12-13: qty 10

## 2014-12-13 MED ORDER — ACETAMINOPHEN 325 MG PO TABS
650.0000 mg | ORAL_TABLET | Freq: Once | ORAL | Status: AC
  Administered 2014-12-13: 650 mg via ORAL

## 2014-12-13 NOTE — Patient Instructions (Signed)
Cancer Center Discharge Instructions for Patients Receiving Chemotherapy  Today you received the following chemotherapy agents: Herceptin, Perjeta, Taxotere, Carboplatin   To help prevent nausea and vomiting after your treatment, we encourage you to take your nausea medication as directed.    If you develop nausea and vomiting that is not controlled by your nausea medication, call the clinic.   BELOW ARE SYMPTOMS THAT SHOULD BE REPORTED IMMEDIATELY:  *FEVER GREATER THAN 100.5 F  *CHILLS WITH OR WITHOUT FEVER  NAUSEA AND VOMITING THAT IS NOT CONTROLLED WITH YOUR NAUSEA MEDICATION  *UNUSUAL SHORTNESS OF BREATH  *UNUSUAL BRUISING OR BLEEDING  TENDERNESS IN MOUTH AND THROAT WITH OR WITHOUT PRESENCE OF ULCERS  *URINARY PROBLEMS  *BOWEL PROBLEMS  UNUSUAL RASH Items with * indicate a potential emergency and should be followed up as soon as possible.  Feel free to call the clinic you have any questions or concerns. The clinic phone number is [phone].  Please show the CHEMO ALERT CARD at check-in to the Emergency Department and triage nurse.   

## 2014-12-13 NOTE — Telephone Encounter (Signed)
Gave avs & calendar for June/July.  °

## 2014-12-13 NOTE — Progress Notes (Signed)
Patient Care Team: Laray Anger, MD as PCP - General (Obstetrics and Gynecology) Erroll Luna, MD as Consulting Physician (General Surgery) Nicholas Lose, MD as Consulting Physician (Hematology and Oncology) Gery Pray, MD as Consulting Physician (Radiation Oncology) Mauro Kaufmann, RN as Registered Nurse Rockwell Germany, RN as Registered Nurse  DIAGNOSIS: Breast cancer of upper-outer quadrant of left female breast   Staging form: Breast, AJCC 7th Edition     Clinical stage from 10/16/2014: Stage IIA (T1c, N1, M0) - Unsigned   SUMMARY OF ONCOLOGIC HISTORY:   Breast cancer of upper-outer quadrant of left female breast   10/01/2014 Mammogram Left breast mammogram and ultrasound for palpable left breast mass which was a maxillary mass with an ill-defined abnormality by ultrasound 1.7 cm at 2:00   10/11/2014 Initial Diagnosis Left breast invasive ductal carcinoma, grade 3, ER/PR negative, 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 5.32, axillary lymph node also positive for cancer   10/30/2014 Imaging CT chest abdomen pelvis and bone scan revealed no evidence of distant metastatic disease but enlarged left axillary and left subpectoral lymph nodes   11/01/2014 -  Neo-Adjuvant Chemotherapy Neoadjuvant TCH Perjeta to 3 weeks 6 cycles followed by Herceptin maintenance    CHIEF COMPLIANT: cycle 3 day 1 neo-adjuvant TCH Perjeta  INTERVAL HISTORY: [name] [name] is a 54 year old with above-mentioned history of left breast cancer currently on neoadjuvant chemotherapy. Today she is due for cycle 3 of TCHP. Her migraines have resolved. She will no longer be receiving aloxi or zofran. Her nausea is improved and she is eating better. She has gained 2lb since last week. This week she has a hemorrhoid from spending so much time on the toilet with diarrhea. It is external. She is using preparation H at this time. She is bleed occasionally from a previous rectal fissure. Her energy level is stable however.  REVIEW OF SYSTEMS:    Constitutional: Denies fevers, chills or abnormal weight loss Eyes: Denies blurriness of vision Ears, nose, mouth, throat, and face: migraines Respiratory: Denies cough, dyspnea or wheezes Cardiovascular: Denies palpitation, chest discomfort or lower extremity swelling Gastrointestinal:  Nausea and vomiting due to migraine Skin: Denies abnormal skin rashes Lymphatics: Denies new lymphadenopathy or easy bruising Neurological:Denies numbness, tingling or new weaknesses Behavioral/Psych: Mood is stable, no new changes  All other systems were reviewed with the patient and are negative.  I have reviewed the past medical history, past surgical history, social history and family history with the patient and they are unchanged from previous note.  ALLERGIES:  is allergic to butorphanol; meperidine; and sulfa antibiotics.  MEDICATIONS:  Current Outpatient Prescriptions  Medication Sig Dispense Refill  . acetaminophen (TYLENOL) 325 MG tablet Take 650 mg by mouth every 6 (six) hours as needed.    . Aspirin-Acetaminophen-Caffeine (GOODYS EXTRA STRENGTH PO) Take by mouth.    Marland Kitchen atenolol-chlorthalidone (TENORETIC) 50-25 MG per tablet Take by mouth.    . B Complex-Biotin-FA (B-COMPLEX PO) Take by mouth.    Marland Kitchen BLACK COHOSH PO Take by mouth.    . Calcium Carbonate (CALCIUM 600 PO) Take by mouth.    . dexamethasone (DECADRON) 4 MG tablet Take 1 tablet (4 mg total) by mouth 2 (two) times daily. Start the day before Taxotere. Then again the day after chemo for 3 days. 30 tablet 1  . Diclofenac Potassium (CAMBIA PO) Take by mouth daily as needed.    . eletriptan (RELPAX) 40 MG tablet Take 1 tablet (40 mg total) by mouth every 2 (two) hours as needed for  migraine or headache (Max 2 per day). 10 tablet 3  . EVENING PRIMROSE OIL PO Take 1,300 mg by mouth.    Marland Kitchen ibuprofen (ADVIL,MOTRIN) 100 MG tablet Take 100 mg by mouth every 6 (six) hours as needed for fever.    . Lactobacillus (ACIDOPHILUS PO) Take by mouth.     . lidocaine-prilocaine (EMLA) cream Apply to affected area once 30 g 3  . LORazepam (ATIVAN) 0.5 MG tablet Take 1 tablet (0.5 mg total) by mouth every 6 (six) hours as needed (Nausea or vomiting). 30 tablet 0  . PARoxetine (PAXIL) 20 MG tablet Take 20 mg by mouth.    . promethazine (PHENERGAN) 12.5 MG tablet Take 1 tablet (12.5 mg total) by mouth every 6 (six) hours as needed for nausea or vomiting. 90 tablet 3  . VITAMIN D, CHOLECALCIFEROL, PO Take by mouth.    . hydrocortisone 2.5 % ointment Apply topically 2 (two) times daily. 30 g 0  . ondansetron (ZOFRAN) 8 MG tablet Take 1 tablet (8 mg total) by mouth 2 (two) times daily. Start the day after chemo for 3 days. Then take as needed for nausea or vomiting. (Patient not taking: Reported on 11/29/2014) 30 tablet 1  . oxyCODONE-acetaminophen (ROXICET) 5-325 MG per tablet Take 1 tablet by mouth every 4 (four) hours as needed. (Patient not taking: Reported on 12/13/2014) 30 tablet 0  . prochlorperazine (COMPAZINE) 10 MG tablet Take 1 tablet (10 mg total) by mouth every 6 (six) hours as needed (Nausea or vomiting). (Patient not taking: Reported on 11/29/2014) 30 tablet 1  . solifenacin (VESICARE) 5 MG tablet Take 10 mg by mouth.     No current facility-administered medications for this visit.    PHYSICAL EXAMINATION: ECOG PERFORMANCE STATUS: 1 - Symptomatic but completely ambulatory  Filed Vitals:   12/13/14 1015  BP: 103/59  Pulse: 76  Temp: 97.6 F (36.4 C)  Resp: 18   Filed Weights   12/13/14 1015  Weight: 105 lb 8 oz (47.854 kg)    GENERAL:alert, no distress and comfortable SKIN: skin color, texture, turgor are normal, no rashes or significant lesions EYES: normal, Conjunctiva are pink and non-injected, sclera clear OROPHARYNX:no exudate, no erythema and lips, buccal mucosa, and tongue normal  NECK: supple, thyroid normal size, non-tender, without nodularity LYMPH:  no palpable lymphadenopathy in the cervical, axillary or  inguinal LUNGS: clear to auscultation and percussion with normal breathing effort HEART: regular rate & rhythm and no murmurs and no lower extremity edema ABDOMEN:abdomen soft, non-tender and normal bowel sounds Musculoskeletal:no cyanosis of digits and no clubbing  NEURO: severe migraines   LABORATORY DATA:  I have reviewed the data as listed   Chemistry      Component Value Date/Time   NA 141 12/13/2014 0936   K 3.8 12/13/2014 0936   CO2 28 12/13/2014 0936   BUN 12.8 12/13/2014 0936   CREATININE 0.7 12/13/2014 0936      Component Value Date/Time   CALCIUM 9.5 12/13/2014 0936   ALKPHOS 123 12/13/2014 0936   AST 38* 12/13/2014 0936   ALT 66* 12/13/2014 0936   BILITOT 0.43 12/13/2014 0936       Lab Results  Component Value Date   WBC 6.2 12/13/2014   HGB 9.9* 12/13/2014   HCT 29.3* 12/13/2014   MCV 91.3 12/13/2014   PLT 318 12/13/2014   NEUTROABS 4.7 12/13/2014   ASSESSMENT & PLAN:  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  3, 1.7 cm tumor at 2:00 position, left axillary lymph node palpable, ER/PR negative, 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 5.32, T1 cN1 M0 stage II a clinical stage  Treatment Plan: Neoadjuvant chemotherapy with Taxotere, carboplatin, Herceptin and Perjeta every 3 weeks 6 cycles started 11/01/2014 (patient will need a breast MRI and CT chest after conclusion of neoadjuvant chemotherapy because his subpectoral nodes will need to be analyzed but the CT scan)  Current treatment: Cycle 3 day 1 TCHP  Chemotherapy toxicities: 1. Severe Migraine after first cycle of treatment: saw neurologist; it was excruciating pain that lasted for 2 weeks apparently responded to steroids 2. Severe nausea but no vomiting related to migraine 3. Diarrhea 3 episodes every day of liquid watery stools did get better with Imodium 4. Inability to eat because of nausea: resolved 5. External hemorrhoid: wrote prescription for  hydrocortisone ointment to apply to affected area. Witch hazel to be used for local irritation 6. Anemia: likely combination of treatment related anemia, and mild blood loss from rectal fissure and hemorrhoid 9. Elevated AST/ALT: improved this week   Discontinued Aloxi from future chemotherapy treatments Added Emend from cycle 2 of chemotherapy Giving her IV hydration and Phenergan day 4 of each cycle  Monitoring blood counts closely. Will return for nadir check in 1 week.   No orders of the defined types were placed in this encounter.   The patient has a good understanding of the overall plan. she agrees with it. she will call with any problems that may develop before the next visit here.   Laurie Panda, NP

## 2014-12-16 ENCOUNTER — Ambulatory Visit (HOSPITAL_BASED_OUTPATIENT_CLINIC_OR_DEPARTMENT_OTHER): Payer: PRIVATE HEALTH INSURANCE

## 2014-12-16 VITALS — BP 95/56 | HR 78 | Temp 97.7°F | Resp 18

## 2014-12-16 DIAGNOSIS — C50412 Malignant neoplasm of upper-outer quadrant of left female breast: Secondary | ICD-10-CM

## 2014-12-16 MED ORDER — HEPARIN SOD (PORK) LOCK FLUSH 100 UNIT/ML IV SOLN
500.0000 [IU] | Freq: Once | INTRAVENOUS | Status: AC
  Administered 2014-12-16: 500 [IU] via INTRAVENOUS
  Filled 2014-12-16: qty 5

## 2014-12-16 MED ORDER — SODIUM CHLORIDE 0.9 % IJ SOLN
10.0000 mL | INTRAMUSCULAR | Status: DC | PRN
  Administered 2014-12-16: 10 mL via INTRAVENOUS
  Filled 2014-12-16: qty 10

## 2014-12-16 MED ORDER — DEXTROSE-NACL 5-0.9 % IV SOLN
Freq: Once | INTRAVENOUS | Status: AC
  Administered 2014-12-16: 10:00:00 via INTRAVENOUS

## 2014-12-16 NOTE — Patient Instructions (Signed)

## 2014-12-16 NOTE — Progress Notes (Signed)
1325: Patient's post infusion blood pressure at 1315 was 83/43. Patient was asymptomatic. Patient states that she is "fine and I'm ready to go home."  [employer] called and orders for an additional 500 ml of fluids received. Patient declined and Cyndee made aware. Blood pressure retaken with an adult small cuff and B/P was 95/56. Patient educated on increasing fluids, re-checking her B/P this afternoon and rising slowly. No complaints from patient.

## 2014-12-19 ENCOUNTER — Ambulatory Visit (HOSPITAL_BASED_OUTPATIENT_CLINIC_OR_DEPARTMENT_OTHER): Payer: PRIVATE HEALTH INSURANCE | Admitting: Nurse Practitioner

## 2014-12-19 ENCOUNTER — Other Ambulatory Visit (HOSPITAL_BASED_OUTPATIENT_CLINIC_OR_DEPARTMENT_OTHER): Payer: PRIVATE HEALTH INSURANCE

## 2014-12-19 ENCOUNTER — Encounter: Payer: Self-pay | Admitting: Nurse Practitioner

## 2014-12-19 VITALS — BP 101/67 | HR 79 | Temp 98.2°F | Resp 18 | Ht 60.0 in | Wt 103.7 lb

## 2014-12-19 DIAGNOSIS — Z171 Estrogen receptor negative status [ER-]: Secondary | ICD-10-CM

## 2014-12-19 DIAGNOSIS — R110 Nausea: Secondary | ICD-10-CM

## 2014-12-19 DIAGNOSIS — C50412 Malignant neoplasm of upper-outer quadrant of left female breast: Secondary | ICD-10-CM

## 2014-12-19 LAB — CBC WITH DIFFERENTIAL/PLATELET
BASO%: 0.5 % (ref 0.0–2.0)
BASOS ABS: 0 10*3/uL (ref 0.0–0.1)
EOS%: 0.8 % (ref 0.0–7.0)
Eosinophils Absolute: 0.1 10*3/uL (ref 0.0–0.5)
HEMATOCRIT: 30.4 % — AB (ref 34.8–46.6)
HGB: 10.1 g/dL — ABNORMAL LOW (ref 11.6–15.9)
LYMPH%: 31.3 % (ref 14.0–49.7)
MCH: 31.4 pg (ref 25.1–34.0)
MCHC: 33.2 g/dL (ref 31.5–36.0)
MCV: 94.4 fL (ref 79.5–101.0)
MONO#: 0.5 10*3/uL (ref 0.1–0.9)
MONO%: 8.2 % (ref 0.0–14.0)
NEUT#: 3.5 10*3/uL (ref 1.5–6.5)
NEUT%: 59.2 % (ref 38.4–76.8)
PLATELETS: 159 10*3/uL (ref 145–400)
RBC: 3.22 10*6/uL — ABNORMAL LOW (ref 3.70–5.45)
RDW: 16.8 % — AB (ref 11.2–14.5)
WBC: 5.9 10*3/uL (ref 3.9–10.3)
lymph#: 1.9 10*3/uL (ref 0.9–3.3)

## 2014-12-19 LAB — COMPREHENSIVE METABOLIC PANEL (CC13)
ALK PHOS: 128 U/L (ref 40–150)
ALT: 75 U/L — AB (ref 0–55)
ANION GAP: 8 meq/L (ref 3–11)
AST: 27 U/L (ref 5–34)
Albumin: 4 g/dL (ref 3.5–5.0)
BILIRUBIN TOTAL: 0.47 mg/dL (ref 0.20–1.20)
BUN: 11.5 mg/dL (ref 7.0–26.0)
CO2: 30 meq/L — AB (ref 22–29)
Calcium: 9.5 mg/dL (ref 8.4–10.4)
Chloride: 102 mEq/L (ref 98–109)
Creatinine: 0.6 mg/dL (ref 0.6–1.1)
EGFR: >90 mL/min/{1.73_m2} (ref >90)
GLUCOSE: 97 mg/dL (ref 70–140)
POTASSIUM: 3.6 meq/L (ref 3.5–5.1)
Sodium: 140 mEq/L (ref 136–145)
Total Protein: 6.6 g/dL (ref 6.4–8.3)

## 2014-12-19 NOTE — Progress Notes (Signed)
Patient Care Team: Laray Anger, MD as PCP - General (Obstetrics and Gynecology) Erroll Luna, MD as Consulting Physician (General Surgery) Nicholas Lose, MD as Consulting Physician (Hematology and Oncology) Gery Pray, MD as Consulting Physician (Radiation Oncology) Mauro Kaufmann, RN as Registered Nurse Rockwell Germany, RN as Registered Nurse  DIAGNOSIS: Breast cancer of upper-outer quadrant of left female breast   Staging form: Breast, AJCC 7th Edition     Clinical stage from 10/16/2014: Stage IIA (T1c, N1, M0) - Unsigned   SUMMARY OF ONCOLOGIC HISTORY:   Breast cancer of upper-outer quadrant of left female breast   10/01/2014 Mammogram Left breast mammogram and ultrasound for palpable left breast mass which was a maxillary mass with an ill-defined abnormality by ultrasound 1.7 cm at 2:00   10/11/2014 Initial Diagnosis Left breast invasive ductal carcinoma, grade 3, ER/PR negative, 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 5.32, axillary lymph node also positive for cancer   10/30/2014 Imaging CT chest abdomen pelvis and bone scan revealed no evidence of distant metastatic disease but enlarged left axillary and left subpectoral lymph nodes   11/01/2014 -  Neo-Adjuvant Chemotherapy Neoadjuvant TCH Perjeta to 3 weeks 6 cycles followed by Herceptin maintenance     CHIEF COMPLIANT: cycle 3 day 8 neo-adjuvant TCH Perjeta  INTERVAL HISTORY: [name] [name] is a 54 year old with above-mentioned history of left breast cancer currently on neoadjuvant chemotherapy. Today is cycle 3, day 1 of TCHP. She had moderate nausea, managed with phenergan. She had diarrhea 3 times in 1 day, but this was resolved with imodium twice. Fortunately her hemorrhoids are shrinking. She is noticing less blood on her tissue. She lost 2 lbs. Her appetite is down secondary to taste changes.    REVIEW OF SYSTEMS:   Constitutional: Denies fevers, chills or abnormal weight loss Eyes: Denies blurriness of vision Ears, nose, mouth, throat, and  face: migraines Respiratory: Denies cough, dyspnea or wheezes Cardiovascular: Denies palpitation, chest discomfort or lower extremity swelling Gastrointestinal:  Nausea and vomiting due to migraine Skin: Denies abnormal skin rashes Lymphatics: Denies new lymphadenopathy or easy bruising Neurological:Denies numbness, tingling or new weaknesses Behavioral/Psych: Mood is stable, no new changes  All other systems were reviewed with the patient and are negative.  I have reviewed the past medical history, past surgical history, social history and family history with the patient and they are unchanged from previous note.  ALLERGIES:  is allergic to butorphanol; meperidine; and sulfa antibiotics.  MEDICATIONS:  Current Outpatient Prescriptions  Medication Sig Dispense Refill  . atenolol-chlorthalidone (TENORETIC) 50-25 MG per tablet Take by mouth. Pt takes 75-25 mg of this medication    . Calcium Carbonate (CALCIUM 600 PO) Take by mouth.    . dexamethasone (DECADRON) 4 MG tablet Take 1 tablet (4 mg total) by mouth 2 (two) times daily. Start the day before Taxotere. Then again the day after chemo for 3 days. 30 tablet 1  . Diclofenac Potassium (CAMBIA PO) Take by mouth daily as needed.    . hydrocortisone 2.5 % ointment Apply topically 2 (two) times daily. 30 g 0  . ibuprofen (ADVIL,MOTRIN) 100 MG tablet Take 100 mg by mouth every 6 (six) hours as needed for fever.    . Lactobacillus (ACIDOPHILUS PO) Take by mouth.    Marland Kitchen LORazepam (ATIVAN) 0.5 MG tablet Take 1 tablet (0.5 mg total) by mouth every 6 (six) hours as needed (Nausea or vomiting). 30 tablet 0  . PARoxetine (PAXIL) 20 MG tablet Take 20 mg by mouth.    Marland Kitchen  promethazine (PHENERGAN) 12.5 MG tablet Take 1 tablet (12.5 mg total) by mouth every 6 (six) hours as needed for nausea or vomiting. 90 tablet 3  . VITAMIN D, CHOLECALCIFEROL, PO Take by mouth.    Marland Kitchen acetaminophen (TYLENOL) 325 MG tablet Take 650 mg by mouth every 6 (six) hours as needed.     . Aspirin-Acetaminophen-Caffeine (GOODYS EXTRA STRENGTH PO) Take by mouth.    . B Complex-Biotin-FA (B-COMPLEX PO) Take by mouth.    Marland Kitchen BLACK COHOSH PO Take by mouth.    . eletriptan (RELPAX) 40 MG tablet Take 1 tablet (40 mg total) by mouth every 2 (two) hours as needed for migraine or headache (Max 2 per day). (Patient not taking: Reported on 12/19/2014) 10 tablet 3  . EVENING PRIMROSE OIL PO Take 1,300 mg by mouth.    . lidocaine-prilocaine (EMLA) cream Apply to affected area once (Patient not taking: Reported on 12/19/2014) 30 g 3  . oxyCODONE-acetaminophen (ROXICET) 5-325 MG per tablet Take 1 tablet by mouth every 4 (four) hours as needed. (Patient not taking: Reported on 12/13/2014) 30 tablet 0  . prochlorperazine (COMPAZINE) 10 MG tablet Take 1 tablet (10 mg total) by mouth every 6 (six) hours as needed (Nausea or vomiting). (Patient not taking: Reported on 11/29/2014) 30 tablet 1  . solifenacin (VESICARE) 5 MG tablet Take 10 mg by mouth.     No current facility-administered medications for this visit.    PHYSICAL EXAMINATION: ECOG PERFORMANCE STATUS: 1 - Symptomatic but completely ambulatory  Filed Vitals:   12/19/14 0909  BP: 101/67  Pulse: 79  Temp: 98.2 F (36.8 C)  Resp: 18   Filed Weights   12/19/14 0909  Weight: 103 lb 11.2 oz (47.038 kg)    GENERAL:alert, no distress and comfortable SKIN: skin color, texture, turgor are normal, no rashes or significant lesions EYES: normal, Conjunctiva are pink and non-injected, sclera clear OROPHARYNX:no exudate, no erythema and lips, buccal mucosa, and tongue normal  NECK: supple, thyroid normal size, non-tender, without nodularity LYMPH:  no palpable lymphadenopathy in the cervical, axillary or inguinal LUNGS: clear to auscultation and percussion with normal breathing effort HEART: regular rate & rhythm and no murmurs and no lower extremity edema ABDOMEN:abdomen soft, non-tender and normal bowel sounds Musculoskeletal:no cyanosis  of digits and no clubbing  NEURO: severe migraines   LABORATORY DATA:  I have reviewed the data as listed   Chemistry      Component Value Date/Time   NA 141 12/13/2014 0936   K 3.8 12/13/2014 0936   CO2 28 12/13/2014 0936   BUN 12.8 12/13/2014 0936   CREATININE 0.7 12/13/2014 0936      Component Value Date/Time   CALCIUM 9.5 12/13/2014 0936   ALKPHOS 123 12/13/2014 0936   AST 38* 12/13/2014 0936   ALT 66* 12/13/2014 0936   BILITOT 0.43 12/13/2014 0936       Lab Results  Component Value Date   WBC 5.9 12/19/2014   HGB 10.1* 12/19/2014   HCT 30.4* 12/19/2014   MCV 94.4 12/19/2014   PLT 159 12/19/2014   NEUTROABS 3.5 12/19/2014   ASSESSMENT & PLAN:  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:00 position, left axillary lymph node palpable, ER/PR negative, 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 5.32, T1 cN1 M0 stage II a clinical stage  Treatment Plan: Neoadjuvant chemotherapy with Taxotere, carboplatin, Herceptin and Perjeta every 3 weeks 6 cycles started 11/01/2014 (patient will need  a breast MRI and CT chest after conclusion of neoadjuvant chemotherapy because his subpectoral nodes will need to be analyzed but the CT scan)  Current treatment: Cycle 3 day 8 TCHP  Chemotherapy toxicities: 1. Severe Migraine after first cycle of treatment: saw neurologist; it was excruciating pain that lasted for 2 weeks apparently responded to steroids 2. Severe nausea but no vomiting related to migraine. Managed better with phenergan 3. Diarrhea 3 episodes every day of liquid watery stools did get better with Imodium 4. Inability to eat because of nausea: resolved 5. External hemorrhoid: wrote prescription for hydrocortisone ointment to apply to affected area. Witch hazel to be used for local irritation -- improved. Hemorrhoids shrinking. 6. Anemia: likely combination of treatment related anemia, and mild blood loss from  rectal fissure and hemorrhoid. hgb stable at 10.1 9. Elevated AST/ALT: continues to improve  Discontinued Aloxi from future chemotherapy treatments Added Emend from cycle 2 of chemotherapy Giving her IV hydration and Phenergan day 4 of each cycle  Monitoring blood counts closely. Will return cycle 4 in 2 weeks.   No orders of the defined types were placed in this encounter.   The patient has a good understanding of the overall plan. she agrees with it. she will call with any problems that may develop before the next visit here.   Laurie Panda, NP

## 2014-12-20 ENCOUNTER — Other Ambulatory Visit: Payer: Self-pay | Admitting: *Deleted

## 2014-12-20 DIAGNOSIS — C50412 Malignant neoplasm of upper-outer quadrant of left female breast: Secondary | ICD-10-CM

## 2014-12-23 ENCOUNTER — Other Ambulatory Visit: Payer: Self-pay | Admitting: *Deleted

## 2014-12-23 ENCOUNTER — Telehealth: Payer: Self-pay | Admitting: *Deleted

## 2014-12-23 NOTE — Telephone Encounter (Signed)
Pt called and stated she had chemo 8 - 10 days ago, and still battling diarrhea and migraine.  Spoke with pt and was informed that pt has had intermittent diarrhea - took Imodium with relief.  Pt able to eat and drink fluids fine; however, pt did not think she drinks enough.  Pt also stated her migraine headache was worsened with chemo.   Denied nausea/vomiting.   Pt felt that she might need to come in for IVF - which helps with migraine - per pt.  Went over [employer] with pt to help with diarrhea issue.   Gave pt appt to see Jenny Reichmann, NP symptom management for further evaluation on 12/24/14; pt to receive IVF after office visit.  Pt voiced understanding. Pt's  Phone    [phone].

## 2014-12-24 ENCOUNTER — Ambulatory Visit (HOSPITAL_BASED_OUTPATIENT_CLINIC_OR_DEPARTMENT_OTHER): Payer: PRIVATE HEALTH INSURANCE | Admitting: Nurse Practitioner

## 2014-12-24 ENCOUNTER — Ambulatory Visit (HOSPITAL_BASED_OUTPATIENT_CLINIC_OR_DEPARTMENT_OTHER): Payer: PRIVATE HEALTH INSURANCE

## 2014-12-24 ENCOUNTER — Telehealth: Payer: Self-pay | Admitting: Hematology and Oncology

## 2014-12-24 VITALS — BP 87/59 | HR 77 | Temp 98.4°F | Resp 16 | Wt 105.1 lb

## 2014-12-24 VITALS — BP 94/50 | HR 68 | Temp 98.2°F | Resp 16

## 2014-12-24 DIAGNOSIS — Z5189 Encounter for other specified aftercare: Secondary | ICD-10-CM

## 2014-12-24 DIAGNOSIS — C50412 Malignant neoplasm of upper-outer quadrant of left female breast: Secondary | ICD-10-CM

## 2014-12-24 DIAGNOSIS — C773 Secondary and unspecified malignant neoplasm of axilla and upper limb lymph nodes: Secondary | ICD-10-CM | POA: Diagnosis not present

## 2014-12-24 DIAGNOSIS — G43919 Migraine, unspecified, intractable, without status migrainosus: Secondary | ICD-10-CM

## 2014-12-24 DIAGNOSIS — R197 Diarrhea, unspecified: Secondary | ICD-10-CM | POA: Diagnosis not present

## 2014-12-24 DIAGNOSIS — R110 Nausea: Secondary | ICD-10-CM | POA: Diagnosis not present

## 2014-12-24 DIAGNOSIS — C50919 Malignant neoplasm of unspecified site of unspecified female breast: Secondary | ICD-10-CM

## 2014-12-24 DIAGNOSIS — E860 Dehydration: Secondary | ICD-10-CM

## 2014-12-24 DIAGNOSIS — Z95828 Presence of other vascular implants and grafts: Secondary | ICD-10-CM

## 2014-12-24 DIAGNOSIS — I959 Hypotension, unspecified: Secondary | ICD-10-CM

## 2014-12-24 LAB — CBC WITH DIFFERENTIAL/PLATELET
BASO%: 0.6 % (ref 0.0–2.0)
Basophils Absolute: 0.3 10*3/uL — ABNORMAL HIGH (ref 0.0–0.1)
EOS%: 0 % (ref 0.0–7.0)
Eosinophils Absolute: 0 10*3/uL (ref 0.0–0.5)
HEMATOCRIT: 31 % — AB (ref 34.8–46.6)
HGB: 10.2 g/dL — ABNORMAL LOW (ref 11.6–15.9)
LYMPH%: 6 % — AB (ref 14.0–49.7)
MCH: 30.8 pg (ref 25.1–34.0)
MCHC: 33.1 g/dL (ref 31.5–36.0)
MCV: 93.2 fL (ref 79.5–101.0)
MONO#: 0.8 10*3/uL (ref 0.1–0.9)
MONO%: 1.8 % (ref 0.0–14.0)
NEUT#: 41.6 10*3/uL — ABNORMAL HIGH (ref 1.5–6.5)
NEUT%: 91.6 % — ABNORMAL HIGH (ref 38.4–76.8)
PLATELETS: 198 10*3/uL (ref 145–400)
RBC: 3.33 10*6/uL — ABNORMAL LOW (ref 3.70–5.45)
RDW: 17.9 % — ABNORMAL HIGH (ref 11.2–14.5)
WBC: 45.5 10*3/uL — AB (ref 3.9–10.3)
lymph#: 2.7 10*3/uL (ref 0.9–3.3)

## 2014-12-24 LAB — COMPREHENSIVE METABOLIC PANEL (CC13)
ALT: 39 U/L (ref 0–55)
AST: 33 U/L (ref 5–34)
Albumin: 3.8 g/dL (ref 3.5–5.0)
Alkaline Phosphatase: 202 U/L — ABNORMAL HIGH (ref 40–150)
Anion Gap: 7 mEq/L (ref 3–11)
BUN: 7.9 mg/dL (ref 7.0–26.0)
CALCIUM: 9.2 mg/dL (ref 8.4–10.4)
CO2: 31 mEq/L — ABNORMAL HIGH (ref 22–29)
CREATININE: 0.7 mg/dL (ref 0.6–1.1)
Chloride: 105 mEq/L (ref 98–109)
EGFR: >90 mL/min/{1.73_m2} (ref >90)
Glucose: 93 mg/dl (ref 70–140)
Potassium: 3.9 mEq/L (ref 3.5–5.1)
Sodium: 143 mEq/L (ref 136–145)
Total Bilirubin: 0.23 mg/dL (ref 0.20–1.20)
Total Protein: 6.4 g/dL (ref 6.4–8.3)

## 2014-12-24 MED ORDER — SODIUM CHLORIDE 0.9 % IV SOLN
Freq: Once | INTRAVENOUS | Status: AC
  Administered 2014-12-24: 12:00:00 via INTRAVENOUS

## 2014-12-24 MED ORDER — KETOROLAC TROMETHAMINE 30 MG/ML IJ SOLN: 30.0000 mg | Freq: Once | INTRAMUSCULAR | Status: AC

## 2014-12-24 MED ORDER — SODIUM CHLORIDE 0.9 % IJ SOLN
10.0000 mL | INTRAMUSCULAR | Status: DC | PRN
  Filled 2014-12-24: qty 10

## 2014-12-24 MED ORDER — LORAZEPAM 0.5 MG PO TABS: 0.5000 mg | ORAL_TABLET | Freq: Four times a day (QID) | ORAL | Status: DC | PRN

## 2014-12-24 MED ORDER — SODIUM CHLORIDE 0.9 % IJ SOLN
10.0000 mL | INTRAMUSCULAR | Status: DC | PRN
  Administered 2014-12-24 (×2): 10 mL via INTRAVENOUS
  Filled 2014-12-24: qty 10

## 2014-12-24 MED ORDER — HEPARIN SOD (PORK) LOCK FLUSH 100 UNIT/ML IV SOLN
500.0000 [IU] | Freq: Once | INTRAVENOUS | Status: AC
  Administered 2014-12-24: 500 [IU] via INTRAVENOUS
  Filled 2014-12-24: qty 5

## 2014-12-24 NOTE — Telephone Encounter (Signed)
Patient called in to check her appointment time for cindy today,pof noted and appointment made,spoke with vanessa charge in chemo and she will talk with cindy abut adding ivf today due to busy time slots

## 2014-12-24 NOTE — Patient Instructions (Signed)

## 2014-12-30 ENCOUNTER — Encounter: Payer: Self-pay | Admitting: Nurse Practitioner

## 2014-12-30 DIAGNOSIS — I959 Hypotension, unspecified: Secondary | ICD-10-CM | POA: Insufficient documentation

## 2014-12-30 DIAGNOSIS — R110 Nausea: Secondary | ICD-10-CM | POA: Insufficient documentation

## 2014-12-30 DIAGNOSIS — R197 Diarrhea, unspecified: Secondary | ICD-10-CM | POA: Insufficient documentation

## 2014-12-30 DIAGNOSIS — E860 Dehydration: Secondary | ICD-10-CM | POA: Insufficient documentation

## 2014-12-30 NOTE — Progress Notes (Signed)
SYMPTOM MANAGEMENT CLINIC   HPI: [name] [name] 54 y.o. female diagnosed with breast cancer.  Currently undergoing Taxotere/carboplatin/Herceptin/Perjeta chemotherapy regimen.  Patient has a history of chronic migraine; and takes both atenolol and Relpax prophylactically per her neurologist.  Patient states that has had a chronic migraine for last several days.  She states she has been taking her anti--migraine medications as directed.  She is complaining of chronic nausea.  She feels mildly dehydrated today.  She is experiencing some photosensitivity as well.  She denies any other neurological deficits whatsoever.  She denies any other new symptoms.  She denies any recent fever or chills.   Headache   Diarrhea  Associated symptoms include headaches.  Urinary Tract Infection     Review of Systems  Gastrointestinal: Positive for diarrhea.  Neurological: Positive for headaches.    Past Medical History  Diagnosis Date  . Osteopenia   . Migraines   . Neurogenic bladder   . Wears contact lenses   . Family history of breast cancer   . Family history of colon cancer   . Breast cancer of upper-outer quadrant of right female breast 10/11/2014  . Breast cancer 2016    ER-/PR-/Her2+    Past Surgical History  Procedure Laterality Date  . Vein ligation      both legs  . Breast surgery      breast [id number]  . Colonoscopy    . Upper gi endoscopy    . Portacath placement Right 10/29/2014    Procedure: INSERTION PORT-A-CATH WITH ULTRASOUND;  Surgeon: Erroll Luna, MD;  Location: [facility];  Service: General;  Laterality: Right;    has Breast cancer of upper-outer quadrant of left female breast; Family history of breast cancer; Family history of colon cancer; Migraine headache; Genetic testing; UTI (urinary tract infection); Antineoplastic chemotherapy induced anemia; External hemorrhoid; Nausea without vomiting; Diarrhea; Dehydration; and Hypotension on her problem  list.    is allergic to butorphanol; meperidine; and sulfa antibiotics.    Medication List       This list is accurate as of: 12/24/14 11:59 PM.  Always use your most recent med list.               acetaminophen 325 MG tablet  Commonly known as:  TYLENOL  Take 650 mg by mouth every 6 (six) hours as needed.     ACIDOPHILUS PO  Take by mouth.     atenolol-chlorthalidone 50-25 MG per tablet  Commonly known as:  TENORETIC  Take by mouth. Pt takes 75-25 mg of this medication     B-COMPLEX PO  Take by mouth.     BLACK COHOSH PO  Take by mouth.     CALCIUM 600 PO  Take by mouth.     CAMBIA PO  Take by mouth daily as needed.     dexamethasone 4 MG tablet  Commonly known as:  DECADRON  Take 1 tablet (4 mg total) by mouth 2 (two) times daily. Start the day before Taxotere. Then again the day after chemo for 3 days.     eletriptan 40 MG tablet  Commonly known as:  RELPAX  Take 1 tablet (40 mg total) by mouth every 2 (two) hours as needed for migraine or headache (Max 2 per day).     EVENING PRIMROSE OIL PO  Take 1,300 mg by mouth.     GOODYS EXTRA STRENGTH PO  Take by mouth.     hydrocortisone 2.5 % ointment  Apply topically 2 (two) times daily.     ibuprofen 100 MG tablet  Commonly known as:  ADVIL,MOTRIN  Take 100 mg by mouth every 6 (six) hours as needed for fever.     lidocaine-prilocaine cream  Commonly known as:  EMLA  Apply to affected area once     LORazepam 0.5 MG tablet  Commonly known as:  ATIVAN  Take 1 tablet (0.5 mg total) by mouth every 6 (six) hours as needed (Nausea or vomiting).     oxyCODONE-acetaminophen 5-325 MG per tablet  Commonly known as:  ROXICET  Take 1 tablet by mouth every 4 (four) hours as needed.     PARoxetine 20 MG tablet  Commonly known as:  PAXIL  Take 20 mg by mouth.     prochlorperazine 10 MG tablet  Commonly known as:  COMPAZINE  Take 1 tablet (10 mg total) by mouth every 6 (six) hours as needed (Nausea or  vomiting).     promethazine 12.5 MG tablet  Commonly known as:  PHENERGAN  Take 1 tablet (12.5 mg total) by mouth every 6 (six) hours as needed for nausea or vomiting.     solifenacin 5 MG tablet  Commonly known as:  VESICARE  Take 10 mg by mouth.     VITAMIN D (CHOLECALCIFEROL) PO  Take by mouth.         PHYSICAL EXAMINATION  Oncology Vitals 12/24/2014 12/24/2014 12/19/2014 12/16/2014 12/16/2014 12/16/2014 12/13/2014  Height - - 152 cm - - - -  Weight - 47.673 kg 47.038 kg - - - 47.854 kg  Weight (lbs) - 105 lbs 2 oz 103 lbs 11 oz - - - 105 lbs 8 oz  BMI (kg/m2) - - 20.25 kg/m2 - - - -  Temp 98.2 98.4 98.2 - - 97.7 97.6  Pulse 68 77 79 78 52 69 76  Resp 16 16 18 18 18 18 18   SpO2 100 100 99 - - 100 -  BSA (m2) - - 1.41 m2 - - - -   BP Readings from Last 3 Encounters:  12/24/14 94/50  12/24/14 87/59  12/19/14 101/67    Physical Exam  Constitutional: She is oriented to person, place, and time and well-developed, well-nourished, and in no distress. No distress.  HENT:  Head: Normocephalic and atraumatic.  Mouth/Throat: Oropharynx is clear and moist.  Eyes: Conjunctivae and EOM are normal. Pupils are equal, round, and reactive to light. Right eye exhibits no discharge. Left eye exhibits no discharge. No scleral icterus.  Neck: Normal range of motion.  Cardiovascular: Normal rate, regular rhythm, normal heart sounds and intact distal pulses.   Pulmonary/Chest: Effort normal. No respiratory distress.  Right upper chest Port-A-Cath intact; it appears to be healing well.  No evidence of infection.  Musculoskeletal: Normal range of motion. She exhibits no edema or tenderness.  Neurological: She is alert and oriented to person, place, and time. Gait normal.  Skin: Skin is warm and dry. No rash noted. No erythema. There is pallor.  Psychiatric: Affect normal.  Nursing note and vitals reviewed.   LABORATORY DATA:. Clinical Support on 12/24/2014  Component Date Value Ref Range  Status  . WBC 12/24/2014 45.5* 3.9 - 10.3 10e3/uL Final  . NEUT# 12/24/2014 41.6* 1.5 - 6.5 10e3/uL Final  . HGB 12/24/2014 10.2* 11.6 - 15.9 g/dL Final  . HCT 12/24/2014 31.0* 34.8 - 46.6 % Final  . Platelets 12/24/2014 198  145 - 400 10e3/uL Final  . MCV 12/24/2014 93.2  79.5 -  101.0 fL Final  . MCH 12/24/2014 30.8  25.1 - 34.0 pg Final  . MCHC 12/24/2014 33.1  31.5 - 36.0 g/dL Final  . RBC 12/24/2014 3.33* 3.70 - 5.45 10e6/uL Final  . RDW 12/24/2014 17.9* 11.2 - 14.5 % Final  . lymph# 12/24/2014 2.7  0.9 - 3.3 10e3/uL Final  . MONO# 12/24/2014 0.8  0.1 - 0.9 10e3/uL Final  . Eosinophils Absolute 12/24/2014 0.0  0.0 - 0.5 10e3/uL Final  . Basophils Absolute 12/24/2014 0.3* 0.0 - 0.1 10e3/uL Final  . NEUT% 12/24/2014 91.6* 38.4 - 76.8 % Final  . LYMPH% 12/24/2014 6.0* 14.0 - 49.7 % Final  . MONO% 12/24/2014 1.8  0.0 - 14.0 % Final  . EOS% 12/24/2014 0.0  0.0 - 7.0 % Final  . BASO% 12/24/2014 0.6  0.0 - 2.0 % Final  . Sodium 12/24/2014 143  136 - 145 mEq/L Final  . Potassium 12/24/2014 3.9  3.5 - 5.1 mEq/L Final  . Chloride 12/24/2014 105  98 - 109 mEq/L Final  . CO2 12/24/2014 31* 22 - 29 mEq/L Final  . Glucose 12/24/2014 93  70 - 140 mg/dl Final  . BUN 12/24/2014 7.9  7.0 - 26.0 mg/dL Final  . Creatinine 12/24/2014 0.7  0.6 - 1.1 mg/dL Final  . Total Bilirubin 12/24/2014 0.23  0.20 - 1.20 mg/dL Final  . Alkaline Phosphatase 12/24/2014 202* 40 - 150 U/L Final  . AST 12/24/2014 33  5 - 34 U/L Final  . ALT 12/24/2014 39  0 - 55 U/L Final  . Total Protein 12/24/2014 6.4  6.4 - 8.3 g/dL Final  . Albumin 12/24/2014 3.8  3.5 - 5.0 g/dL Final  . Calcium 12/24/2014 9.2  8.4 - 10.4 mg/dL Final  . Anion Gap 12/24/2014 7  3 - 11 mEq/L Final  . EGFR 12/24/2014 >90  >90 ml/min/1.73 m2 Final   eGFR is calculated using the CKD-EPI Creatinine Equation (2009)     RADIOGRAPHIC STUDIES: No results found.  ASSESSMENT/PLAN:    Breast cancer of upper-outer quadrant of left female breast Pt  received her last cycle of herceptin/perjeta/carbo/taxotere chemo on 6/171/6.  She reports that she took dexamethasone for an extra day following chemo to help prevent post-chemo nausea.   She is scheduled to return for her next labs, visit, and chemo on 01/03/15.     Dehydration Pt reports that her diarrhea has resolved with OTC Imodium; but she still feels nauseous and dehydrated.  Pt also continues with one of her typical migraines; despite taking her migraine meds.  Pt will receive IV fluid rehydration while at the cancer center today. She was also encouraged to push fluids at home.   Diarrhea C/o mild diarrhea; which has resolved with imodium.    Hypotension BP initially 87/59; but BP improved with IV fluids to 94/50.  Pt continues on atenolol QD for tx of her chronic migraines. Discussed option of decreasing the atenolol since it appears to be causing chronic hypotension. Pt stated that she has been on the same dose of atenolol for several years to help prevent migraines; and she has no interest in decreasing the dose.   Advised pt that the chemotherapy and subsequent dehydration may require a lower dose of atenolol in the future; if BP remains low.   Migraine headache Patient has a history of chronic migraine; and takes both atenolol and Relpax prophylactically per her neurologist.  Patient states that has had a chronic migraine for last several days.  She states she  has been taking her anti--migraine medications as directed.  She is complaining of chronic nausea.  She feels mildly dehydrated today.  She is experiencing some photosensitivity as well.  She denies any other neurological deficits whatsoever.  On exam.-Patient appears generally uncomfortable; but with neuro intact.  Patient was given Toradol 30 mg IV; but refused the rest of the migraine "cocktail"- stating that she is driving today and can't be given sedating meds.   Pt advised to follow back up with her neurologist if  headache continues.   Advised patient to call/return or go directly to the emergency department if she develops any worsening symptoms whatsoever.    Nausea without vomiting C/o mild nausea; but no vomiting. Has anti-nausea meds at home to take as needed.    Patient stated understanding of all instructions; and was in agreement with this plan of care. The patient knows to call the clinic with any problems, questions or concerns.   Review/collaboration with Dr. Lindi Adie regarding all aspects of patient's visit today.   Total time spent with patient was 25 minutes;  with greater than 75 percent of that time spent in face to face counseling regarding patient's symptoms,  and coordination of care and follow up.  Disclaimer: This note was dictated with voice recognition software. Similar sounding words can inadvertently be transcribed and may not be corrected upon review.   Drue Second, NP 12/30/2014

## 2014-12-30 NOTE — Assessment & Plan Note (Signed)
Pt reports that her diarrhea has resolved with OTC Imodium; but she still feels nauseous and dehydrated.  Pt also continues with one of her typical migraines; despite taking her migraine meds.  Pt will receive IV fluid rehydration while at the cancer center today. She was also encouraged to push fluids at home.

## 2014-12-30 NOTE — Assessment & Plan Note (Signed)
C/o mild diarrhea; which has resolved with imodium.

## 2014-12-30 NOTE — Assessment & Plan Note (Signed)
C/o mild nausea; but no vomiting. Has anti-nausea meds at home to take as needed.

## 2014-12-30 NOTE — Assessment & Plan Note (Signed)
Patient has a history of chronic migraine; and takes both atenolol and Relpax prophylactically per her neurologist.  Patient states that has had a chronic migraine for last several days.  She states she has been taking her anti--migraine medications as directed.  She is complaining of chronic nausea.  She feels mildly dehydrated today.  She is experiencing some photosensitivity as well.  She denies any other neurological deficits whatsoever.  On exam.-Patient appears generally uncomfortable; but with neuro intact.  Patient was given Toradol 30 mg IV; but refused the rest of the migraine "cocktail"- stating that she is driving today and can't be given sedating meds.   Pt advised to follow back up with her neurologist if headache continues.   Advised patient to call/return or go directly to the emergency department if she develops any worsening symptoms whatsoever.

## 2014-12-30 NOTE — Assessment & Plan Note (Signed)
BP initially 87/59; but BP improved with IV fluids to 94/50.  Pt continues on atenolol QD for tx of her chronic migraines. Discussed option of decreasing the atenolol since it appears to be causing chronic hypotension. Pt stated that she has been on the same dose of atenolol for several years to help prevent migraines; and she has no interest in decreasing the dose.   Advised pt that the chemotherapy and subsequent dehydration may require a lower dose of atenolol in the future; if BP remains low.

## 2014-12-30 NOTE — Assessment & Plan Note (Signed)
Pt received her last cycle of herceptin/perjeta/carbo/taxotere chemo on 6/171/6.  She reports that she took dexamethasone for an extra day following chemo to help prevent post-chemo nausea.   She is scheduled to return for her next labs, visit, and chemo on 01/03/15.

## 2015-01-02 ENCOUNTER — Other Ambulatory Visit: Payer: Self-pay | Admitting: *Deleted

## 2015-01-03 ENCOUNTER — Encounter: Payer: Self-pay | Admitting: *Deleted

## 2015-01-03 ENCOUNTER — Ambulatory Visit (HOSPITAL_BASED_OUTPATIENT_CLINIC_OR_DEPARTMENT_OTHER): Payer: PRIVATE HEALTH INSURANCE | Admitting: Hematology and Oncology

## 2015-01-03 ENCOUNTER — Telehealth: Payer: Self-pay | Admitting: Hematology and Oncology

## 2015-01-03 ENCOUNTER — Other Ambulatory Visit (HOSPITAL_BASED_OUTPATIENT_CLINIC_OR_DEPARTMENT_OTHER): Payer: PRIVATE HEALTH INSURANCE

## 2015-01-03 ENCOUNTER — Ambulatory Visit (HOSPITAL_BASED_OUTPATIENT_CLINIC_OR_DEPARTMENT_OTHER): Payer: PRIVATE HEALTH INSURANCE

## 2015-01-03 ENCOUNTER — Encounter: Payer: Self-pay | Admitting: Hematology and Oncology

## 2015-01-03 VITALS — BP 105/65 | HR 78 | Temp 97.7°F | Resp 18 | Ht 60.0 in | Wt 108.0 lb

## 2015-01-03 DIAGNOSIS — C773 Secondary and unspecified malignant neoplasm of axilla and upper limb lymph nodes: Secondary | ICD-10-CM

## 2015-01-03 DIAGNOSIS — Z5112 Encounter for antineoplastic immunotherapy: Secondary | ICD-10-CM | POA: Diagnosis not present

## 2015-01-03 DIAGNOSIS — C50412 Malignant neoplasm of upper-outer quadrant of left female breast: Secondary | ICD-10-CM

## 2015-01-03 DIAGNOSIS — Z5111 Encounter for antineoplastic chemotherapy: Secondary | ICD-10-CM | POA: Diagnosis not present

## 2015-01-03 DIAGNOSIS — R197 Diarrhea, unspecified: Secondary | ICD-10-CM | POA: Diagnosis not present

## 2015-01-03 DIAGNOSIS — R110 Nausea: Secondary | ICD-10-CM | POA: Diagnosis not present

## 2015-01-03 DIAGNOSIS — Z171 Estrogen receptor negative status [ER-]: Secondary | ICD-10-CM

## 2015-01-03 LAB — CBC WITH DIFFERENTIAL/PLATELET
BASO%: 0.2 % (ref 0.0–2.0)
BASOS ABS: 0 10*3/uL (ref 0.0–0.1)
EOS%: 0 % (ref 0.0–7.0)
Eosinophils Absolute: 0 10*3/uL (ref 0.0–0.5)
HEMATOCRIT: 26.9 % — AB (ref 34.8–46.6)
HEMOGLOBIN: 9.1 g/dL — AB (ref 11.6–15.9)
LYMPH#: 2.3 10*3/uL (ref 0.9–3.3)
LYMPH%: 16.2 % (ref 14.0–49.7)
MCH: 32 pg (ref 25.1–34.0)
MCHC: 33.9 g/dL (ref 31.5–36.0)
MCV: 94.3 fL (ref 79.5–101.0)
MONO#: 0.9 10*3/uL (ref 0.1–0.9)
MONO%: 6.5 % (ref 0.0–14.0)
NEUT#: 10.9 10*3/uL — ABNORMAL HIGH (ref 1.5–6.5)
NEUT%: 77.1 % — AB (ref 38.4–76.8)
Platelets: 187 10*3/uL (ref 145–400)
RBC: 2.86 10*6/uL — ABNORMAL LOW (ref 3.70–5.45)
RDW: 19.2 % — AB (ref 11.2–14.5)
WBC: 14.2 10*3/uL — AB (ref 3.9–10.3)

## 2015-01-03 LAB — COMPREHENSIVE METABOLIC PANEL (CC13)
ALBUMIN: 3.9 g/dL (ref 3.5–5.0)
ALT: 81 U/L — ABNORMAL HIGH (ref 0–55)
ANION GAP: 8 meq/L (ref 3–11)
AST: 46 U/L — ABNORMAL HIGH (ref 5–34)
Alkaline Phosphatase: 129 U/L (ref 40–150)
BUN: 16.3 mg/dL (ref 7.0–26.0)
CHLORIDE: 103 meq/L (ref 98–109)
CO2: 29 meq/L (ref 22–29)
Calcium: 9.7 mg/dL (ref 8.4–10.4)
Creatinine: 0.6 mg/dL (ref 0.6–1.1)
GLUCOSE: 116 mg/dL (ref 70–140)
POTASSIUM: 3.6 meq/L (ref 3.5–5.1)
Sodium: 140 mEq/L (ref 136–145)
TOTAL PROTEIN: 6.2 g/dL — AB (ref 6.4–8.3)
Total Bilirubin: 0.34 mg/dL (ref 0.20–1.20)

## 2015-01-03 MED ORDER — DOCETAXEL CHEMO INJECTION 160 MG/16ML
75.0000 mg/m2 | Freq: Once | INTRAVENOUS | Status: AC
  Administered 2015-01-03: 110 mg via INTRAVENOUS
  Filled 2015-01-03: qty 11

## 2015-01-03 MED ORDER — DIPHENHYDRAMINE HCL 25 MG PO CAPS
25.0000 mg | ORAL_CAPSULE | Freq: Once | ORAL | Status: AC
  Administered 2015-01-03: 25 mg via ORAL

## 2015-01-03 MED ORDER — TRASTUZUMAB CHEMO INJECTION 440 MG
6.0000 mg/kg | Freq: Once | INTRAVENOUS | Status: AC
  Administered 2015-01-03: 315 mg via INTRAVENOUS
  Filled 2015-01-03: qty 15

## 2015-01-03 MED ORDER — HEPARIN SOD (PORK) LOCK FLUSH 100 UNIT/ML IV SOLN
500.0000 [IU] | Freq: Once | INTRAVENOUS | Status: AC | PRN
  Administered 2015-01-03: 500 [IU]
  Filled 2015-01-03: qty 5

## 2015-01-03 MED ORDER — SODIUM CHLORIDE 0.9 % IV SOLN
539.4000 mg | Freq: Once | INTRAVENOUS | Status: AC
  Administered 2015-01-03: 540 mg via INTRAVENOUS
  Filled 2015-01-03: qty 54

## 2015-01-03 MED ORDER — ACETAMINOPHEN 325 MG PO TABS
650.0000 mg | ORAL_TABLET | Freq: Once | ORAL | Status: AC
  Administered 2015-01-03: 650 mg via ORAL

## 2015-01-03 MED ORDER — SODIUM CHLORIDE 0.9 % IJ SOLN
10.0000 mL | INTRAMUSCULAR | Status: DC | PRN
  Administered 2015-01-03: 10 mL
  Filled 2015-01-03: qty 10

## 2015-01-03 MED ORDER — ACETAMINOPHEN 325 MG PO TABS
ORAL_TABLET | ORAL | Status: AC
  Filled 2015-01-03: qty 2

## 2015-01-03 MED ORDER — SODIUM CHLORIDE 0.9 % IV SOLN
Freq: Once | INTRAVENOUS | Status: AC
  Administered 2015-01-03: 12:00:00 via INTRAVENOUS
  Filled 2015-01-03: qty 5

## 2015-01-03 MED ORDER — DIPHENHYDRAMINE HCL 25 MG PO CAPS
ORAL_CAPSULE | ORAL | Status: AC
  Filled 2015-01-03: qty 1

## 2015-01-03 MED ORDER — PEGFILGRASTIM 6 MG/0.6ML ~~LOC~~ PSKT
6.0000 mg | PREFILLED_SYRINGE | Freq: Once | SUBCUTANEOUS | Status: AC
  Administered 2015-01-03: 6 mg via SUBCUTANEOUS
  Filled 2015-01-03: qty 0.6

## 2015-01-03 MED ORDER — SODIUM CHLORIDE 0.9 % IV SOLN
Freq: Once | INTRAVENOUS | Status: AC
  Administered 2015-01-03: 12:00:00 via INTRAVENOUS

## 2015-01-03 MED ORDER — SODIUM CHLORIDE 0.9 % IV SOLN
420.0000 mg | Freq: Once | INTRAVENOUS | Status: AC
  Administered 2015-01-03: 420 mg via INTRAVENOUS
  Filled 2015-01-03: qty 14

## 2015-01-03 MED ORDER — PROMETHAZINE HCL 25 MG/ML IJ SOLN
25.0000 mg | Freq: Every day | INTRAMUSCULAR | Status: DC
  Administered 2015-01-03: 25 mg via INTRAVENOUS
  Filled 2015-01-03: qty 1

## 2015-01-03 NOTE — Telephone Encounter (Signed)
Gave and printed appt sched and avs for pt for July  °

## 2015-01-03 NOTE — Progress Notes (Signed)
Patient Care Team: Laray Anger, MD as PCP - General (Obstetrics and Gynecology) Erroll Luna, MD as Consulting Physician (General Surgery) Nicholas Lose, MD as Consulting Physician (Hematology and Oncology) Gery Pray, MD as Consulting Physician (Radiation Oncology) Mauro Kaufmann, RN as Registered Nurse Rockwell Germany, RN as Registered Nurse  DIAGNOSIS: Breast cancer of upper-outer quadrant of left female breast   Staging form: Breast, AJCC 7th Edition     Clinical stage from 10/16/2014: Stage IIA (T1c, N1, M0) - Unsigned   SUMMARY OF ONCOLOGIC HISTORY:   Breast cancer of upper-outer quadrant of left female breast   10/01/2014 Mammogram Left breast mammogram and ultrasound for palpable left breast mass which was a maxillary mass with an ill-defined abnormality by ultrasound 1.7 cm at 2:00   10/11/2014 Initial Diagnosis Left breast invasive ductal carcinoma, grade 3, ER/PR negative, 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 5.32, axillary lymph node also positive for cancer   10/30/2014 Imaging CT chest abdomen pelvis and bone scan revealed no evidence of distant metastatic disease but enlarged left axillary and left subpectoral lymph nodes   11/01/2014 -  Neo-Adjuvant Chemotherapy Neoadjuvant TCH Perjeta to 3 weeks 6 cycles followed by Herceptin maintenance    CHIEF COMPLIANT: Migraine started when she stopped Decadron  INTERVAL HISTORY: [name] [name] is a 54 year old with above-mentioned history of left breast cancer continue adjuvant chemotherapy with Hag[place]. She has had multiple problems throughout his chemotherapy so far especially with the severe migraines. The last cycle was much better however the migraine return back once she stopped using the dexamethasone pills. So this cycle she would like to remain on dexamethasone for the rest of the treatment. Denies any fevers or chills. Does have nausea and vomiting related to migraines.  REVIEW OF SYSTEMS:   Constitutional: Denies fevers, chills or  abnormal weight loss Eyes: Denies blurriness of vision Ears, nose, mouth, throat, and face: Denies mucositis or sore throat Respiratory: Denies cough, dyspnea or wheezes Cardiovascular: Denies palpitation, chest discomfort or lower extremity swelling Gastrointestinal:  Denies nausea, heartburn or change in bowel habits Skin: Denies abnormal skin rashes Lymphatics: Denies new lymphadenopathy or easy bruising Neurological: Frequent migraines Behavioral/Psych: Mood is stable, no new changes   All other systems were reviewed with the patient and are negative.  I have reviewed the past medical history, past surgical history, social history and family history with the patient and they are unchanged from previous note.  ALLERGIES:  is allergic to butorphanol; meperidine; and sulfa antibiotics.  MEDICATIONS:  Current Outpatient Prescriptions  Medication Sig Dispense Refill  . acetaminophen (TYLENOL) 325 MG tablet Take 650 mg by mouth every 6 (six) hours as needed.    . Aspirin-Acetaminophen-Caffeine (GOODYS EXTRA STRENGTH PO) Take by mouth.    Marland Kitchen atenolol-chlorthalidone (TENORETIC) 50-25 MG per tablet Take by mouth. Pt takes 75-25 mg of this medication    . B Complex-Biotin-FA (B-COMPLEX PO) Take by mouth.    Marland Kitchen BLACK COHOSH PO Take by mouth.    . Calcium Carbonate (CALCIUM 600 PO) Take by mouth.    . dexamethasone (DECADRON) 2 MG tablet Take 2 mg by mouth. Takes daily for migraines until prescribed 4 mg regimen starts    . dexamethasone (DECADRON) 4 MG tablet Take 1 tablet (4 mg total) by mouth 2 (two) times daily. Start the day before Taxotere. Then again the day after chemo for 3 days. 30 tablet 1  . Diclofenac Potassium (CAMBIA PO) Take by mouth daily as needed.    . eletriptan (RELPAX)  40 MG tablet Take 1 tablet (40 mg total) by mouth every 2 (two) hours as needed for migraine or headache (Max 2 per day). 10 tablet 3  . EVENING PRIMROSE OIL PO Take 1,300 mg by mouth.    . hydrocortisone 2.5  % ointment Apply topically 2 (two) times daily. 30 g 0  . ibuprofen (ADVIL,MOTRIN) 100 MG tablet Take 100 mg by mouth every 6 (six) hours as needed for fever.    . Lactobacillus (ACIDOPHILUS PO) Take by mouth.    . lidocaine-prilocaine (EMLA) cream Apply to affected area once 30 g 3  . LORazepam (ATIVAN) 0.5 MG tablet Take 1 tablet (0.5 mg total) by mouth every 6 (six) hours as needed (Nausea or vomiting). 45 tablet 0  . oxyCODONE-acetaminophen (ROXICET) 5-325 MG per tablet Take 1 tablet by mouth every 4 (four) hours as needed. 30 tablet 0  . PARoxetine (PAXIL) 20 MG tablet Take 20 mg by mouth.    . prochlorperazine (COMPAZINE) 10 MG tablet Take 1 tablet (10 mg total) by mouth every 6 (six) hours as needed (Nausea or vomiting). 30 tablet 1  . promethazine (PHENERGAN) 12.5 MG tablet Take 1 tablet (12.5 mg total) by mouth every 6 (six) hours as needed for nausea or vomiting. 90 tablet 3  . solifenacin (VESICARE) 5 MG tablet Take 10 mg by mouth.    Marland Kitchen VITAMIN D, CHOLECALCIFEROL, PO Take by mouth.     No current facility-administered medications for this visit.   Facility-Administered Medications Ordered in Other Visits  Medication Dose Route Frequency Provider Last Rate Last Dose  . CARBOplatin (PARAPLATIN) 540 mg in sodium chloride 0.9 % 250 mL chemo infusion  540 mg Intravenous Once Nicholas Lose, MD      . DOCEtaxel (TAXOTERE) 110 mg in dextrose 5 % 250 mL chemo infusion  75 mg/m2 (Treatment Plan Actual) Intravenous Once Nicholas Lose, MD      . pegfilgrastim (NEULASTA ONPRO KIT) injection 6 mg  6 mg Subcutaneous Once Nicholas Lose, MD      . promethazine (PHENERGAN) injection 25 mg  25 mg Intravenous Daily Nicholas Lose, MD   25 mg at 01/03/15 1215    PHYSICAL EXAMINATION: ECOG PERFORMANCE STATUS: 1 - Symptomatic but completely ambulatory  Filed Vitals:   01/03/15 1054  BP: 105/65  Pulse: 78  Temp: 97.7 F (36.5 C)  Resp: 18   Filed Weights   01/03/15 1054  Weight: 108 lb (48.988 kg)     GENERAL:alert, no distress and comfortable SKIN: skin color, texture, turgor are normal, no rashes or significant lesions EYES: normal, Conjunctiva are pink and non-injected, sclera clear OROPHARYNX:no exudate, no erythema and lips, buccal mucosa, and tongue normal  NECK: supple, thyroid normal size, non-tender, without nodularity LYMPH:  no palpable lymphadenopathy in the cervical, axillary or inguinal LUNGS: clear to auscultation and percussion with normal breathing effort HEART: regular rate & rhythm and no murmurs and no lower extremity edema ABDOMEN:abdomen soft, non-tender and normal bowel sounds Musculoskeletal:no cyanosis of digits and no clubbing  NEURO: alert & oriented x 3 with fluent speech, no focal motor/sensory deficits  LABORATORY DATA:  I have reviewed the data as listed   Chemistry      Component Value Date/Time   NA 140 01/03/2015 1041   K 3.6 01/03/2015 1041   CO2 29 01/03/2015 1041   BUN 16.3 01/03/2015 1041   CREATININE 0.6 01/03/2015 1041      Component Value Date/Time   CALCIUM 9.7 01/03/2015 1041  ALKPHOS 129 01/03/2015 1041   AST 46* 01/03/2015 1041   ALT 81* 01/03/2015 1041   BILITOT 0.34 01/03/2015 1041       Lab Results  Component Value Date   WBC 14.2* 01/03/2015   HGB 9.1* 01/03/2015   HCT 26.9* 01/03/2015   MCV 94.3 01/03/2015   PLT 187 01/03/2015   NEUTROABS 10.9* 01/03/2015   ASSESSMENT & PLAN:  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 Severe Migraine after first cycle of treatment: saw neurologist; it was excruciating pain that lasted for 2 weeks apparently responded to steroids. Steroids daily are able to control her migraines the best 2. Severe nausea but no vomiting related to migraine 3. Diarrhea 3 episodes every day of liquid watery stools did get better with Imodium 4. Inability to eat because of nausea  Plan: 1. Return on 7/12 and 7/18 for IV fluids 2. Decadron daily 2 mg Return to clinic in 3 weeks for follow-up    No orders of the defined types were placed in this encounter.   The patient has a good understanding of the overall plan. she agrees with it. she will call with any problems that may develop before the next visit here.   Rulon Eisenmenger, MD

## 2015-01-03 NOTE — Assessment & Plan Note (Signed)
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 Severe Migraine after first cycle of treatment: saw neurologist; it was excruciating pain that lasted for 2 weeks apparently responded to steroids. Steroids daily are able to control her migraines the best 2. Severe nausea but no vomiting related to migraine 3. Diarrhea 3 episodes every day of liquid watery stools did get better with Imodium 4. Inability to eat because of nausea  Plan: 1. Return on 7/12 and 7/18 for IV fluids 2. Decadron daily 2 mg Return to clinic in 3 weeks for follow-up

## 2015-01-03 NOTE — Patient Instructions (Signed)
[facility] Discharge Instructions for Patients Receiving Chemotherapy  Today you received the following chemotherapy agents TCH/Perjeta.   To help prevent nausea and vomiting after your treatment, we encourage you to take your nausea medication as directed.    If you develop nausea and vomiting that is not controlled by your nausea medication, call the clinic.   BELOW ARE SYMPTOMS THAT SHOULD BE REPORTED IMMEDIATELY:  *FEVER GREATER THAN 100.5 F  *CHILLS WITH OR WITHOUT FEVER  NAUSEA AND VOMITING THAT IS NOT CONTROLLED WITH YOUR NAUSEA MEDICATION  *UNUSUAL SHORTNESS OF BREATH  *UNUSUAL BRUISING OR BLEEDING  TENDERNESS IN MOUTH AND THROAT WITH OR WITHOUT PRESENCE OF ULCERS  *URINARY PROBLEMS  *BOWEL PROBLEMS  UNUSUAL RASH Items with * indicate a potential emergency and should be followed up as soon as possible.  Feel free to call the clinic you have any questions or concerns. The clinic phone number is [phone].  Please show the [facility] at check-in to the Emergency Department and triage nurse.

## 2015-01-06 ENCOUNTER — Ambulatory Visit: Payer: PRIVATE HEALTH INSURANCE

## 2015-01-07 ENCOUNTER — Ambulatory Visit (HOSPITAL_BASED_OUTPATIENT_CLINIC_OR_DEPARTMENT_OTHER): Payer: PRIVATE HEALTH INSURANCE

## 2015-01-07 VITALS — BP 92/53 | HR 71 | Temp 98.1°F | Resp 18

## 2015-01-07 DIAGNOSIS — R197 Diarrhea, unspecified: Secondary | ICD-10-CM

## 2015-01-07 DIAGNOSIS — R110 Nausea: Secondary | ICD-10-CM | POA: Diagnosis not present

## 2015-01-07 DIAGNOSIS — C50412 Malignant neoplasm of upper-outer quadrant of left female breast: Secondary | ICD-10-CM

## 2015-01-07 MED ORDER — HEPARIN SOD (PORK) LOCK FLUSH 100 UNIT/ML IV SOLN
500.0000 [IU] | Freq: Once | INTRAVENOUS | Status: AC
  Administered 2015-01-07: 500 [IU] via INTRAVENOUS
  Filled 2015-01-07: qty 5

## 2015-01-07 MED ORDER — SODIUM CHLORIDE 0.9 % IJ SOLN
10.0000 mL | INTRAMUSCULAR | Status: DC | PRN
Start: 2015-01-07 — End: 2015-01-07
  Administered 2015-01-07: 10 mL via INTRAVENOUS
  Filled 2015-01-07: qty 10

## 2015-01-07 MED ORDER — SODIUM CHLORIDE 0.9 % IV SOLN
INTRAVENOUS | Status: DC
  Administered 2015-01-07: 10:00:00 via INTRAVENOUS

## 2015-01-07 NOTE — Patient Instructions (Signed)

## 2015-01-09 ENCOUNTER — Telehealth: Payer: Self-pay | Admitting: Hematology and Oncology

## 2015-01-09 NOTE — Telephone Encounter (Signed)
lvm for pt regarding to echo.Marland KitchenMarland Kitchen

## 2015-01-13 ENCOUNTER — Ambulatory Visit (HOSPITAL_BASED_OUTPATIENT_CLINIC_OR_DEPARTMENT_OTHER): Payer: PRIVATE HEALTH INSURANCE

## 2015-01-13 ENCOUNTER — Other Ambulatory Visit: Payer: Self-pay

## 2015-01-13 VITALS — BP 89/58 | HR 88 | Temp 98.4°F | Resp 16

## 2015-01-13 DIAGNOSIS — R197 Diarrhea, unspecified: Secondary | ICD-10-CM | POA: Diagnosis not present

## 2015-01-13 DIAGNOSIS — C50919 Malignant neoplasm of unspecified site of unspecified female breast: Secondary | ICD-10-CM

## 2015-01-13 DIAGNOSIS — C50412 Malignant neoplasm of upper-outer quadrant of left female breast: Secondary | ICD-10-CM

## 2015-01-13 DIAGNOSIS — R110 Nausea: Secondary | ICD-10-CM | POA: Diagnosis not present

## 2015-01-13 MED ORDER — HEPARIN SOD (PORK) LOCK FLUSH 100 UNIT/ML IV SOLN
500.0000 [IU] | Freq: Once | INTRAVENOUS | Status: AC
  Administered 2015-01-13: 500 [IU] via INTRAVENOUS
  Filled 2015-01-13: qty 5

## 2015-01-13 MED ORDER — SODIUM CHLORIDE 0.9 % IV SOLN
INTRAVENOUS | Status: DC
  Administered 2015-01-13: 08:00:00 via INTRAVENOUS

## 2015-01-13 MED ORDER — SODIUM CHLORIDE 0.9 % IJ SOLN
10.0000 mL | INTRAMUSCULAR | Status: DC | PRN
  Administered 2015-01-13: 10 mL via INTRAVENOUS
  Filled 2015-01-13: qty 10

## 2015-01-13 NOTE — Patient Instructions (Signed)
Dehydration, Adult Dehydration is when you lose more fluids from the body than you take in. Vital organs like the kidneys, brain, and heart cannot function without a proper amount of fluids and salt. Any loss of fluids from the body can cause dehydration.  CAUSES   Vomiting.  Diarrhea.  Excessive sweating.  Excessive urine output.  Fever. SYMPTOMS  Mild dehydration  Thirst.  Dry lips.  Slightly dry mouth. Moderate dehydration  Very dry mouth.  Sunken eyes.  Skin does not bounce back quickly when lightly pinched and released.  Dark urine and decreased urine production.  Decreased tear production.  Headache. Severe dehydration  Very dry mouth.  Extreme thirst.  Rapid, weak pulse (more than 100 beats per minute at rest).  Cold hands and feet.  Not able to sweat in spite of heat and temperature.  Rapid breathing.  Blue lips.  Confusion and lethargy.  Difficulty being awakened.  Minimal urine production.  No tears. DIAGNOSIS  Your caregiver will diagnose dehydration based on your symptoms and your exam. Blood and urine tests will help confirm the diagnosis. The diagnostic evaluation should also identify the cause of dehydration. TREATMENT  Treatment of mild or moderate dehydration can often be done at home by increasing the amount of fluids that you drink. It is best to drink small amounts of fluid more often. Drinking too much at one time can make vomiting worse. Refer to the home care instructions below. Severe dehydration needs to be treated at the hospital where you will probably be given intravenous (IV) fluids that contain water and electrolytes. HOME CARE INSTRUCTIONS   Ask your caregiver about specific rehydration instructions.  Drink enough fluids to keep your urine clear or pale yellow.  Drink small amounts frequently if you have nausea and vomiting.  Eat as you normally do.  Avoid:  Foods or drinks high in sugar.  Carbonated  drinks.  Juice.  Extremely hot or cold fluids.  Drinks with caffeine.  Fatty, greasy foods.  Alcohol.  Tobacco.  Overeating.  Gelatin desserts.  Wash your hands well to avoid spreading bacteria and viruses.  Only take over-the-counter or prescription medicines for pain, discomfort, or fever as directed by your caregiver.  Ask your caregiver if you should continue all prescribed and over-the-counter medicines.  Keep all follow-up appointments with your caregiver. SEEK MEDICAL CARE IF:  You have abdominal pain and it increases or stays in one area (localizes).  You have a rash, stiff neck, or severe headache.  You are irritable, sleepy, or difficult to awaken.  You are weak, dizzy, or extremely thirsty. SEEK IMMEDIATE MEDICAL CARE IF:   You are unable to keep fluids down or you get worse despite treatment.  You have frequent episodes of vomiting or diarrhea.  You have blood or green matter (bile) in your vomit.  You have blood in your stool or your stool looks black and tarry.  You have not urinated in 6 to 8 hours, or you have only urinated a small amount of very dark urine.  You have a fever.  You faint. MAKE SURE YOU:   Understand these instructions.  Will watch your condition.  Will get help right away if you are not doing well or get worse. Document Released: 06/14/2005 Document Revised: 09/06/2011 Document Reviewed: 02/01/2011 Exit[employer] Patient Information 2015 [employer]. This information is not intended to replace advice given to you by your health care provider. Make sure you discuss any questions you have with your health care   provider.  

## 2015-01-13 NOTE — Progress Notes (Signed)
Pt reporting 2 episodes of numbness/tingling to L hand which comes and goes in about a 2 minute increment.  This is something new for her.  Also reporting metallic taste in her mouth which she had with last chemo infusion.  Diarrhea still present but unchanged and nausea present but controlled with PRN antiemetics.  Terri, Desk RN notified of changes with pt.  Pt informed to call us with any other changes she experiences and is concerned about.  Pt reports understanding and no further questions at this time.

## 2015-01-20 ENCOUNTER — Other Ambulatory Visit (HOSPITAL_COMMUNITY): Payer: PRIVATE HEALTH INSURANCE

## 2015-01-22 ENCOUNTER — Ambulatory Visit (HOSPITAL_COMMUNITY)
Admission: RE | Admit: 2015-01-22 | Discharge: 2015-01-22 | Disposition: A | Discharge status: Home / Self Care | Payer: PRIVATE HEALTH INSURANCE | Source: Ambulatory Visit | Attending: Hematology and Oncology | Admitting: Hematology and Oncology

## 2015-01-22 DIAGNOSIS — I340 Nonrheumatic mitral (valve) insufficiency: Secondary | ICD-10-CM | POA: Diagnosis not present

## 2015-01-22 DIAGNOSIS — C50412 Malignant neoplasm of upper-outer quadrant of left female breast: Secondary | ICD-10-CM | POA: Diagnosis not present

## 2015-01-22 NOTE — Progress Notes (Signed)
  Echocardiogram 2D Echocardiogram has been performed.  [name] [name] 01/22/2015, 1:59 PM

## 2015-01-23 NOTE — Assessment & Plan Note (Signed)
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 Severe Migraine after first cycle of treatment: saw neurologist; it was excruciating pain that lasted for 2 weeks apparently responded to steroids. Steroids daily are able to control her migraines the best 2. Severe nausea but no vomiting related to migraine 3. Diarrhea: liquid watery stools: On imodium 4. Inability to eat because of nausea  Plan: 1. Return on 7/12 and 7/18 for IV fluids 2. Decadron daily 2 mg Return to clinic in 3 weeks for follow-up

## 2015-01-24 ENCOUNTER — Encounter: Payer: Self-pay | Admitting: Hematology and Oncology

## 2015-01-24 ENCOUNTER — Encounter: Payer: Self-pay | Admitting: *Deleted

## 2015-01-24 ENCOUNTER — Ambulatory Visit (HOSPITAL_BASED_OUTPATIENT_CLINIC_OR_DEPARTMENT_OTHER): Payer: PRIVATE HEALTH INSURANCE

## 2015-01-24 ENCOUNTER — Other Ambulatory Visit (HOSPITAL_BASED_OUTPATIENT_CLINIC_OR_DEPARTMENT_OTHER): Payer: PRIVATE HEALTH INSURANCE

## 2015-01-24 ENCOUNTER — Telehealth: Payer: Self-pay | Admitting: Hematology and Oncology

## 2015-01-24 ENCOUNTER — Ambulatory Visit (HOSPITAL_BASED_OUTPATIENT_CLINIC_OR_DEPARTMENT_OTHER): Payer: PRIVATE HEALTH INSURANCE | Admitting: Hematology and Oncology

## 2015-01-24 VITALS — BP 98/57 | HR 78 | Temp 99.0°F | Resp 18 | Ht 60.0 in | Wt 107.5 lb

## 2015-01-24 DIAGNOSIS — R110 Nausea: Secondary | ICD-10-CM

## 2015-01-24 DIAGNOSIS — C50412 Malignant neoplasm of upper-outer quadrant of left female breast: Secondary | ICD-10-CM | POA: Diagnosis not present

## 2015-01-24 DIAGNOSIS — C773 Secondary and unspecified malignant neoplasm of axilla and upper limb lymph nodes: Secondary | ICD-10-CM | POA: Diagnosis not present

## 2015-01-24 DIAGNOSIS — Z5111 Encounter for antineoplastic chemotherapy: Secondary | ICD-10-CM | POA: Diagnosis not present

## 2015-01-24 DIAGNOSIS — Z5112 Encounter for antineoplastic immunotherapy: Secondary | ICD-10-CM

## 2015-01-24 DIAGNOSIS — R197 Diarrhea, unspecified: Secondary | ICD-10-CM | POA: Diagnosis not present

## 2015-01-24 DIAGNOSIS — Z171 Estrogen receptor negative status [ER-]: Secondary | ICD-10-CM

## 2015-01-24 LAB — CBC WITH DIFFERENTIAL/PLATELET
BASO%: 0.2 % (ref 0.0–2.0)
Basophils Absolute: 0 10*3/uL (ref 0.0–0.1)
EOS%: 0 % (ref 0.0–7.0)
Eosinophils Absolute: 0 10*3/uL (ref 0.0–0.5)
HCT: 30.8 % — ABNORMAL LOW (ref 34.8–46.6)
HGB: 10.6 g/dL — ABNORMAL LOW (ref 11.6–15.9)
LYMPH%: 9.4 % — ABNORMAL LOW (ref 14.0–49.7)
MCH: 33 pg (ref 25.1–34.0)
MCHC: 34.5 g/dL (ref 31.5–36.0)
MCV: 95.7 fL (ref 79.5–101.0)
MONO#: 0.2 10*3/uL (ref 0.1–0.9)
MONO%: 1.8 % (ref 0.0–14.0)
NEUT#: 9.2 10*3/uL — ABNORMAL HIGH (ref 1.5–6.5)
NEUT%: 88.6 % — ABNORMAL HIGH (ref 38.4–76.8)
Platelets: 257 10*3/uL (ref 145–400)
RBC: 3.22 10*6/uL — ABNORMAL LOW (ref 3.70–5.45)
RDW: 18.7 % — ABNORMAL HIGH (ref 11.2–14.5)
WBC: 10.4 10*3/uL — ABNORMAL HIGH (ref 3.9–10.3)
lymph#: 1 10*3/uL (ref 0.9–3.3)

## 2015-01-24 LAB — COMPREHENSIVE METABOLIC PANEL (CC13)
ALT: 101 U/L — AB (ref 0–55)
ANION GAP: 9 meq/L (ref 3–11)
AST: 70 U/L — ABNORMAL HIGH (ref 5–34)
Albumin: 4 g/dL (ref 3.5–5.0)
Alkaline Phosphatase: 122 U/L (ref 40–150)
BILIRUBIN TOTAL: 0.33 mg/dL (ref 0.20–1.20)
BUN: 11.3 mg/dL (ref 7.0–26.0)
CO2: 27 mEq/L (ref 22–29)
Calcium: 9.7 mg/dL (ref 8.4–10.4)
Chloride: 105 mEq/L (ref 98–109)
Creatinine: 0.7 mg/dL (ref 0.6–1.1)
EGFR: >90 mL/min/{1.73_m2} (ref >90)
Glucose: 143 mg/dl — ABNORMAL HIGH (ref 70–140)
Potassium: 4.1 mEq/L (ref 3.5–5.1)
Sodium: 141 mEq/L (ref 136–145)
Total Protein: 6.6 g/dL (ref 6.4–8.3)

## 2015-01-24 MED ORDER — SODIUM CHLORIDE 0.9 % IJ SOLN
10.0000 mL | INTRAMUSCULAR | Status: DC | PRN
  Administered 2015-01-24: 10 mL
  Filled 2015-01-24: qty 10

## 2015-01-24 MED ORDER — TRASTUZUMAB CHEMO INJECTION 440 MG
6.0000 mg/kg | Freq: Once | INTRAVENOUS | Status: AC
  Administered 2015-01-24: 315 mg via INTRAVENOUS
  Filled 2015-01-24: qty 15

## 2015-01-24 MED ORDER — PERTUZUMAB CHEMO INJECTION 420 MG/14ML
420.0000 mg | Freq: Once | INTRAVENOUS | Status: AC
  Administered 2015-01-24: 420 mg via INTRAVENOUS
  Filled 2015-01-24: qty 14

## 2015-01-24 MED ORDER — SODIUM CHLORIDE 0.9 % IV SOLN
Freq: Once | INTRAVENOUS | Status: DC
Start: 2015-01-24 — End: 2015-01-24

## 2015-01-24 MED ORDER — SODIUM CHLORIDE 0.9 % IV SOLN
Freq: Once | INTRAVENOUS | Status: AC
  Administered 2015-01-24: 10:00:00 via INTRAVENOUS
  Filled 2015-01-24: qty 5

## 2015-01-24 MED ORDER — SODIUM CHLORIDE 0.9 % IV SOLN
INTRAVENOUS | Status: DC
  Administered 2015-01-24: 10:00:00 via INTRAVENOUS
  Filled 2015-01-24: qty 50

## 2015-01-24 MED ORDER — PEGFILGRASTIM 6 MG/0.6ML ~~LOC~~ PSKT
6.0000 mg | PREFILLED_SYRINGE | Freq: Once | SUBCUTANEOUS | Status: AC
  Administered 2015-01-24: 6 mg via SUBCUTANEOUS
  Filled 2015-01-24: qty 0.6

## 2015-01-24 MED ORDER — HEPARIN SOD (PORK) LOCK FLUSH 100 UNIT/ML IV SOLN
500.0000 [IU] | Freq: Once | INTRAVENOUS | Status: AC | PRN
  Administered 2015-01-24: 500 [IU]
  Filled 2015-01-24: qty 5

## 2015-01-24 MED ORDER — DIPHENHYDRAMINE HCL 25 MG PO CAPS
25.0000 mg | ORAL_CAPSULE | Freq: Once | ORAL | Status: AC
  Administered 2015-01-24: 25 mg via ORAL

## 2015-01-24 MED ORDER — ACETAMINOPHEN 325 MG PO TABS
ORAL_TABLET | ORAL | Status: AC
Start: 2015-01-24 — End: 2015-01-24
  Filled 2015-01-24: qty 2

## 2015-01-24 MED ORDER — SODIUM CHLORIDE 0.9 % IV SOLN
539.4000 mg | Freq: Once | INTRAVENOUS | Status: AC
  Administered 2015-01-24: 540 mg via INTRAVENOUS
  Filled 2015-01-24: qty 54

## 2015-01-24 MED ORDER — PROMETHAZINE HCL 25 MG/ML IJ SOLN: 25.0000 mg | Freq: Four times a day (QID) | INTRAMUSCULAR | Status: DC | PRN

## 2015-01-24 MED ORDER — DIPHENHYDRAMINE HCL 25 MG PO CAPS
ORAL_CAPSULE | ORAL | Status: AC
  Filled 2015-01-24: qty 1

## 2015-01-24 MED ORDER — SODIUM CHLORIDE 0.9 % IV SOLN
Freq: Once | INTRAVENOUS | Status: AC
  Administered 2015-01-24: 09:00:00 via INTRAVENOUS

## 2015-01-24 MED ORDER — ACETAMINOPHEN 325 MG PO TABS
650.0000 mg | ORAL_TABLET | Freq: Once | ORAL | Status: AC
  Administered 2015-01-24: 650 mg via ORAL

## 2015-01-24 MED ORDER — DOCETAXEL CHEMO INJECTION 160 MG/16ML
75.0000 mg/m2 | Freq: Once | INTRAVENOUS | Status: AC
  Administered 2015-01-24: 110 mg via INTRAVENOUS
  Filled 2015-01-24: qty 11

## 2015-01-24 MED ORDER — PROMETHAZINE HCL 25 MG/ML IJ SOLN: 25.0000 mg | Freq: Every day | INTRAMUSCULAR | Status: DC

## 2015-01-24 MED ORDER — DIPHENOXYLATE-ATROPINE 2.5-0.025 MG PO TABS: 1.0000 | ORAL_TABLET | Freq: Four times a day (QID) | ORAL | Status: DC | PRN

## 2015-01-24 NOTE — Telephone Encounter (Signed)
appointments made and avs printed for patient °

## 2015-01-24 NOTE — Progress Notes (Signed)
Patient Care Team: Laray Anger, MD as PCP - General (Obstetrics and Gynecology) Erroll Luna, MD as Consulting Physician (General Surgery) Nicholas Lose, MD as Consulting Physician (Hematology and Oncology) Gery Pray, MD as Consulting Physician (Radiation Oncology) Mauro Kaufmann, RN as Registered Nurse Rockwell Germany, RN as Registered Nurse  DIAGNOSIS: Breast cancer of upper-outer quadrant of left female breast   Staging form: Breast, AJCC 7th Edition     Clinical stage from 10/16/2014: Stage IIA (T1c, N1, M0) - Unsigned   SUMMARY OF ONCOLOGIC HISTORY:   Breast cancer of upper-outer quadrant of left female breast   10/01/2014 Mammogram Left breast mammogram and ultrasound for palpable left breast mass which was a maxillary mass with an ill-defined abnormality by ultrasound 1.7 cm at 2:00   10/11/2014 Initial Diagnosis Left breast invasive ductal carcinoma, grade 3, ER/PR negative, 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 5.32, axillary lymph node also positive for cancer   10/30/2014 Imaging CT chest abdomen pelvis and bone scan revealed no evidence of distant metastatic disease but enlarged left axillary and left subpectoral lymph nodes   11/01/2014 -  Neo-Adjuvant Chemotherapy Neoadjuvant TCH Perjeta to 3 weeks 6 cycles followed by Herceptin maintenance    CHIEF COMPLIANT: Cycle 5 TCH Perjeta  INTERVAL HISTORY: [name] [name] is a 54 year old with above-mentioned history of left breast cancer currently in the mitral chemotherapy and today is cycle 5 of [facility]. After the last cycle she had diarrhea that lasted 3-4 days. Her migraines are well controlled while she was on dexamethasone. When she came out of dexamethasone her migraines return. And she started back on 2 mg of dexamethasone. She denies any further problems with nausea vomiting.  REVIEW OF SYSTEMS:   Constitutional: Denies fevers, chills or abnormal weight loss Eyes: Denies blurriness of vision Ears, nose, mouth, throat, and face: Denies  mucositis or sore throat Respiratory: Denies cough, dyspnea or wheezes Cardiovascular: Denies palpitation, chest discomfort or lower extremity swelling Gastrointestinal:  Denies nausea, heartburn or change in bowel habits Skin: Denies abnormal skin rashes Lymphatics: Denies new lymphadenopathy or easy bruising Neurological:Denies numbness, tingling or new weaknesses Behavioral/Psych: Mood is stable, no new changes, migraines  All other systems were reviewed with the patient and are negative.  I have reviewed the past medical history, past surgical history, social history and family history with the patient and they are unchanged from previous note.  ALLERGIES:  is allergic to butorphanol; meperidine; and sulfa antibiotics.  MEDICATIONS:  Current Outpatient Prescriptions  Medication Sig Dispense Refill  . acetaminophen (TYLENOL) 325 MG tablet Take 650 mg by mouth every 6 (six) hours as needed.    . Aspirin-Acetaminophen-Caffeine (GOODYS EXTRA STRENGTH PO) Take by mouth.    Marland Kitchen atenolol-chlorthalidone (TENORETIC) 50-25 MG per tablet Take by mouth. Pt takes 75-25 mg of this medication    . B Complex-Biotin-FA (B-COMPLEX PO) Take by mouth.    Marland Kitchen BLACK COHOSH PO Take by mouth.    . Calcium Carbonate (CALCIUM 600 PO) Take by mouth.    . dexamethasone (DECADRON) 2 MG tablet Take 2 mg by mouth. Takes daily for migraines until prescribed 4 mg regimen starts    . dexamethasone (DECADRON) 4 MG tablet Take 1 tablet (4 mg total) by mouth 2 (two) times daily. Start the day before Taxotere. Then again the day after chemo for 3 days. 30 tablet 1  . Diclofenac Potassium (CAMBIA PO) Take by mouth daily as needed.    . diphenoxylate-atropine (LOMOTIL) 2.5-0.025 MG per tablet Take 1 tablet  by mouth 4 (four) times daily as needed for diarrhea or loose stools. 30 tablet 0  . eletriptan (RELPAX) 40 MG tablet Take 1 tablet (40 mg total) by mouth every 2 (two) hours as needed for migraine or headache (Max 2 per day).  10 tablet 3  . EVENING PRIMROSE OIL PO Take 1,300 mg by mouth.    . hydrocortisone 2.5 % ointment Apply topically 2 (two) times daily. 30 g 0  . ibuprofen (ADVIL,MOTRIN) 100 MG tablet Take 100 mg by mouth every 6 (six) hours as needed for fever.    . Lactobacillus (ACIDOPHILUS PO) Take by mouth.    . lidocaine-prilocaine (EMLA) cream Apply to affected area once 30 g 3  . LORazepam (ATIVAN) 0.5 MG tablet Take 1 tablet (0.5 mg total) by mouth every 6 (six) hours as needed (Nausea or vomiting). 45 tablet 0  . oxyCODONE-acetaminophen (ROXICET) 5-325 MG per tablet Take 1 tablet by mouth every 4 (four) hours as needed. 30 tablet 0  . PARoxetine (PAXIL) 20 MG tablet Take 20 mg by mouth.    . prochlorperazine (COMPAZINE) 10 MG tablet Take 1 tablet (10 mg total) by mouth every 6 (six) hours as needed (Nausea or vomiting). 30 tablet 1  . promethazine (PHENERGAN) 12.5 MG tablet Take 1 tablet (12.5 mg total) by mouth every 6 (six) hours as needed for nausea or vomiting. 90 tablet 3  . solifenacin (VESICARE) 5 MG tablet Take 10 mg by mouth.    Marland Kitchen VITAMIN D, CHOLECALCIFEROL, PO Take by mouth.     No current facility-administered medications for this visit.    PHYSICAL EXAMINATION: ECOG PERFORMANCE STATUS: 1 - Symptomatic but completely ambulatory  Filed Vitals:   01/24/15 0840  BP: 98/57  Pulse: 78  Temp: 99 F (37.2 C)  Resp: 18   Filed Weights   01/24/15 0840  Weight: 107 lb 8 oz (48.762 kg)    GENERAL:alert, no distress and comfortable SKIN: skin color, texture, turgor are normal, no rashes or significant lesions EYES: normal, Conjunctiva are pink and non-injected, sclera clear OROPHARYNX:no exudate, no erythema and lips, buccal mucosa, and tongue normal  NECK: supple, thyroid normal size, non-tender, without nodularity LYMPH:  no palpable lymphadenopathy in the cervical, axillary or inguinal LUNGS: clear to auscultation and percussion with normal breathing effort HEART: regular rate &  rhythm and no murmurs and no lower extremity edema ABDOMEN:abdomen soft, non-tender and normal bowel sounds Musculoskeletal:no cyanosis of digits and no clubbing  NEURO: alert & oriented x 3 with fluent speech, no focal motor/sensory deficits  LABORATORY DATA:  I have reviewed the data as listed   Chemistry      Component Value Date/Time   NA 140 01/03/2015 1041   K 3.6 01/03/2015 1041   CO2 29 01/03/2015 1041   BUN 16.3 01/03/2015 1041   CREATININE 0.6 01/03/2015 1041      Component Value Date/Time   CALCIUM 9.7 01/03/2015 1041   ALKPHOS 129 01/03/2015 1041   AST 46* 01/03/2015 1041   ALT 81* 01/03/2015 1041   BILITOT 0.34 01/03/2015 1041       Lab Results  Component Value Date   WBC 10.4* 01/24/2015   HGB 10.6* 01/24/2015   HCT 30.8* 01/24/2015   MCV 95.7 01/24/2015   PLT 257 01/24/2015   NEUTROABS 9.2* 01/24/2015   ASSESSMENT & PLAN:  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 Severe Migraine after first cycle of treatment: saw neurologist; it was excruciating pain that lasted for 2 weeks apparently responded to steroids. Steroids daily are able to control her migraines the best 2. Severe nausea but no vomiting related to migraine 3. Diarrhea: liquid watery stools: On imodium, I added Lomotil. 4. Inability to eat because of nausea: Nausea is much improved  Plan: 1. Return on 8/1 and 8/11 for IV  fluids 2. Decadron daily 2 mg Return to clinic in 3 weeks for follow-up       Orders Placed This Encounter  Procedures  . MR Breast Bilateral W Wo Contrast    Standing Status: Future     Number of Occurrences:      Standing Expiration Date: 03/26/2016    Order Specific Question:  Reason for Exam (SYMPTOM  OR DIAGNOSIS REQUIRED)    Answer:  Post Neo adjuvant chemo Breast MRI    Order Specific Question:  Preferred imaging location?    Answer:  [id number]    Order Specific Question:  Does the patient have a pacemaker or implanted devices?    Answer:  No    Order Specific Question:  What is the patient's sedation requirement?    Answer:  No Sedation   The patient has a good understanding of the overall plan. she agrees with it. she will call with any problems that may develop before the next visit here.   Rulon Eisenmenger, MD

## 2015-01-24 NOTE — Patient Instructions (Signed)
[facility] Discharge Instructions for Patients Receiving Chemotherapy  Today you received the following chemotherapy agents: Herceptin, Perjeta, Taxotere, Carboplatin  To help prevent nausea and vomiting after your treatment, we encourage you to take your nausea medication as prescribed by your physician.   If you develop nausea and vomiting that is not controlled by your nausea medication, call the clinic.   BELOW ARE SYMPTOMS THAT SHOULD BE REPORTED IMMEDIATELY:  *FEVER GREATER THAN 100.5 F  *CHILLS WITH OR WITHOUT FEVER  NAUSEA AND VOMITING THAT IS NOT CONTROLLED WITH YOUR NAUSEA MEDICATION  *UNUSUAL SHORTNESS OF BREATH  *UNUSUAL BRUISING OR BLEEDING  TENDERNESS IN MOUTH AND THROAT WITH OR WITHOUT PRESENCE OF ULCERS  *URINARY PROBLEMS  *BOWEL PROBLEMS  UNUSUAL RASH Items with * indicate a potential emergency and should be followed up as soon as possible.  Feel free to call the clinic you have any questions or concerns. The clinic phone number is [phone].  Please show the CHEMO ALERT CARD at check-in to the Emergency Department and triage nurse.   

## 2015-01-27 ENCOUNTER — Ambulatory Visit (HOSPITAL_BASED_OUTPATIENT_CLINIC_OR_DEPARTMENT_OTHER): Payer: PRIVATE HEALTH INSURANCE

## 2015-01-27 VITALS — BP 96/64 | HR 84 | Temp 98.1°F

## 2015-01-27 DIAGNOSIS — R197 Diarrhea, unspecified: Secondary | ICD-10-CM

## 2015-01-27 DIAGNOSIS — R110 Nausea: Secondary | ICD-10-CM | POA: Diagnosis not present

## 2015-01-27 DIAGNOSIS — C773 Secondary and unspecified malignant neoplasm of axilla and upper limb lymph nodes: Secondary | ICD-10-CM | POA: Diagnosis not present

## 2015-01-27 DIAGNOSIS — C50412 Malignant neoplasm of upper-outer quadrant of left female breast: Secondary | ICD-10-CM | POA: Diagnosis not present

## 2015-01-27 MED ORDER — SODIUM CHLORIDE 0.9 % IV SOLN
1000.0000 mL | INTRAVENOUS | Status: DC
Start: 2015-01-27 — End: 2015-01-27
  Administered 2015-01-27: 09:00:00 via INTRAVENOUS

## 2015-01-27 NOTE — Patient Instructions (Signed)
Dehydration, Adult Dehydration is when you lose more fluids from the body than you take in. Vital organs like the kidneys, brain, and heart cannot function without a proper amount of fluids and salt. Any loss of fluids from the body can cause dehydration.  CAUSES   Vomiting.  Diarrhea.  Excessive sweating.  Excessive urine output.  Fever. SYMPTOMS  Mild dehydration  Thirst.  Dry lips.  Slightly dry mouth. Moderate dehydration  Very dry mouth.  Sunken eyes.  Skin does not bounce back quickly when lightly pinched and released.  Dark urine and decreased urine production.  Decreased tear production.  Headache. Severe dehydration  Very dry mouth.  Extreme thirst.  Rapid, weak pulse (more than 100 beats per minute at rest).  Cold hands and feet.  Not able to sweat in spite of heat and temperature.  Rapid breathing.  Blue lips.  Confusion and lethargy.  Difficulty being awakened.  Minimal urine production.  No tears. DIAGNOSIS  Your caregiver will diagnose dehydration based on your symptoms and your exam. Blood and urine tests will help confirm the diagnosis. The diagnostic evaluation should also identify the cause of dehydration. TREATMENT  Treatment of mild or moderate dehydration can often be done at home by increasing the amount of fluids that you drink. It is best to drink small amounts of fluid more often. Drinking too much at one time can make vomiting worse. Refer to the home care instructions below. Severe dehydration needs to be treated at the hospital where you will probably be given intravenous (IV) fluids that contain water and electrolytes. HOME CARE INSTRUCTIONS   Ask your caregiver about specific rehydration instructions.  Drink enough fluids to keep your urine clear or pale yellow.  Drink small amounts frequently if you have nausea and vomiting.  Eat as you normally do.  Avoid:  Foods or drinks high in sugar.  Carbonated  drinks.  Juice.  Extremely hot or cold fluids.  Drinks with caffeine.  Fatty, greasy foods.  Alcohol.  Tobacco.  Overeating.  Gelatin desserts.  Wash your hands well to avoid spreading bacteria and viruses.  Only take over-the-counter or prescription medicines for pain, discomfort, or fever as directed by your caregiver.  Ask your caregiver if you should continue all prescribed and over-the-counter medicines.  Keep all follow-up appointments with your caregiver. SEEK MEDICAL CARE IF:  You have abdominal pain and it increases or stays in one area (localizes).  You have a rash, stiff neck, or severe headache.  You are irritable, sleepy, or difficult to awaken.  You are weak, dizzy, or extremely thirsty. SEEK IMMEDIATE MEDICAL CARE IF:   You are unable to keep fluids down or you get worse despite treatment.  You have frequent episodes of vomiting or diarrhea.  You have blood or green matter (bile) in your vomit.  You have blood in your stool or your stool looks black and tarry.  You have not urinated in 6 to 8 hours, or you have only urinated a small amount of very dark urine.  You have a fever.  You faint. MAKE SURE YOU:   Understand these instructions.  Will watch your condition.  Will get help right away if you are not doing well or get worse. Document Released: 06/14/2005 Document Revised: 09/06/2011 Document Reviewed: 02/01/2011 Exit[employer] Patient Information 2015 [employer]. This information is not intended to replace advice given to you by your health care provider. Make sure you discuss any questions you have with your health care   provider.  

## 2015-01-31 ENCOUNTER — Other Ambulatory Visit: Payer: PRIVATE HEALTH INSURANCE

## 2015-01-31 ENCOUNTER — Telehealth: Payer: Self-pay | Admitting: *Deleted

## 2015-01-31 NOTE — Telephone Encounter (Signed)
SPOKE TO THE INFUSION ROOM CHARGE NURSE, ALEXIS ROBINSON,RN. PT. CAN COME TODAY AT 3:00PM OR TOMORROW AT 8:30AM. PT WOULD LIKE TO COME TOMORROW AT 8:30AM. NOTIFIED ALEXIS.

## 2015-02-01 ENCOUNTER — Ambulatory Visit (HOSPITAL_BASED_OUTPATIENT_CLINIC_OR_DEPARTMENT_OTHER): Payer: PRIVATE HEALTH INSURANCE

## 2015-02-01 VITALS — BP 91/49 | HR 79 | Temp 97.0°F | Resp 18

## 2015-02-01 DIAGNOSIS — C50412 Malignant neoplasm of upper-outer quadrant of left female breast: Secondary | ICD-10-CM

## 2015-02-01 MED ORDER — SODIUM CHLORIDE 0.9 % IJ SOLN
10.0000 mL | INTRAMUSCULAR | Status: DC | PRN
  Administered 2015-02-01: 10 mL via INTRAVENOUS
  Filled 2015-02-01: qty 10

## 2015-02-01 MED ORDER — SODIUM CHLORIDE 0.9 % IV SOLN
INTRAVENOUS | Status: DC
  Administered 2015-02-01: 09:00:00 via INTRAVENOUS

## 2015-02-01 MED ORDER — HEPARIN SOD (PORK) LOCK FLUSH 100 UNIT/ML IV SOLN
500.0000 [IU] | Freq: Once | INTRAVENOUS | Status: AC
  Administered 2015-02-01: 500 [IU] via INTRAVENOUS
  Filled 2015-02-01: qty 5

## 2015-02-01 NOTE — Patient Instructions (Signed)
Dehydration, Adult Dehydration is when you lose more fluids from the body than you take in. Vital organs like the kidneys, brain, and heart cannot function without a proper amount of fluids and salt. Any loss of fluids from the body can cause dehydration.  CAUSES   Vomiting.  Diarrhea.  Excessive sweating.  Excessive urine output.  Fever. SYMPTOMS  Mild dehydration  Thirst.  Dry lips.  Slightly dry mouth. Moderate dehydration  Very dry mouth.  Sunken eyes.  Skin does not bounce back quickly when lightly pinched and released.  Dark urine and decreased urine production.  Decreased tear production.  Headache. Severe dehydration  Very dry mouth.  Extreme thirst.  Rapid, weak pulse (more than 100 beats per minute at rest).  Cold hands and feet.  Not able to sweat in spite of heat and temperature.  Rapid breathing.  Blue lips.  Confusion and lethargy.  Difficulty being awakened.  Minimal urine production.  No tears. DIAGNOSIS  Your caregiver will diagnose dehydration based on your symptoms and your exam. Blood and urine tests will help confirm the diagnosis. The diagnostic evaluation should also identify the cause of dehydration. TREATMENT  Treatment of mild or moderate dehydration can often be done at home by increasing the amount of fluids that you drink. It is best to drink small amounts of fluid more often. Drinking too much at one time can make vomiting worse. Refer to the home care instructions below. Severe dehydration needs to be treated at the hospital where you will probably be given intravenous (IV) fluids that contain water and electrolytes. HOME CARE INSTRUCTIONS   Ask your caregiver about specific rehydration instructions.  Drink enough fluids to keep your urine clear or pale yellow.  Drink small amounts frequently if you have nausea and vomiting.  Eat as you normally do.  Avoid:  Foods or drinks high in sugar.  Carbonated  drinks.  Juice.  Extremely hot or cold fluids.  Drinks with caffeine.  Fatty, greasy foods.  Alcohol.  Tobacco.  Overeating.  Gelatin desserts.  Wash your hands well to avoid spreading bacteria and viruses.  Only take over-the-counter or prescription medicines for pain, discomfort, or fever as directed by your caregiver.  Ask your caregiver if you should continue all prescribed and over-the-counter medicines.  Keep all follow-up appointments with your caregiver. SEEK MEDICAL CARE IF:  You have abdominal pain and it increases or stays in one area (localizes).  You have a rash, stiff neck, or severe headache.  You are irritable, sleepy, or difficult to awaken.  You are weak, dizzy, or extremely thirsty. SEEK IMMEDIATE MEDICAL CARE IF:   You are unable to keep fluids down or you get worse despite treatment.  You have frequent episodes of vomiting or diarrhea.  You have blood or green matter (bile) in your vomit.  You have blood in your stool or your stool looks black and tarry.  You have not urinated in 6 to 8 hours, or you have only urinated a small amount of very dark urine.  You have a fever.  You faint. MAKE SURE YOU:   Understand these instructions.  Will watch your condition.  Will get help right away if you are not doing well or get worse. Document Released: 06/14/2005 Document Revised: 09/06/2011 Document Reviewed: 02/01/2011 Exit[employer] Patient Information 2015 [employer]. This information is not intended to replace advice given to you by your health care provider. Make sure you discuss any questions you have with your health care   provider.  

## 2015-02-03 ENCOUNTER — Ambulatory Visit: Payer: PRIVATE HEALTH INSURANCE

## 2015-02-05 ENCOUNTER — Telehealth: Payer: Self-pay

## 2015-02-05 NOTE — Telephone Encounter (Signed)
Drug evaluation recommednation dtd 01/14/15 rcvd from [facility].  Reviewed by Dr. Lindi Adie.  Sent to scan.

## 2015-02-06 ENCOUNTER — Other Ambulatory Visit: Payer: Self-pay | Admitting: *Deleted

## 2015-02-06 ENCOUNTER — Ambulatory Visit: Payer: PRIVATE HEALTH INSURANCE

## 2015-02-06 DIAGNOSIS — C50412 Malignant neoplasm of upper-outer quadrant of left female breast: Secondary | ICD-10-CM

## 2015-02-14 ENCOUNTER — Ambulatory Visit (HOSPITAL_BASED_OUTPATIENT_CLINIC_OR_DEPARTMENT_OTHER): Payer: PRIVATE HEALTH INSURANCE | Admitting: Physician Assistant

## 2015-02-14 ENCOUNTER — Telehealth: Payer: Self-pay | Admitting: Hematology and Oncology

## 2015-02-14 ENCOUNTER — Ambulatory Visit (HOSPITAL_BASED_OUTPATIENT_CLINIC_OR_DEPARTMENT_OTHER): Payer: PRIVATE HEALTH INSURANCE

## 2015-02-14 ENCOUNTER — Other Ambulatory Visit (HOSPITAL_BASED_OUTPATIENT_CLINIC_OR_DEPARTMENT_OTHER): Payer: PRIVATE HEALTH INSURANCE

## 2015-02-14 ENCOUNTER — Encounter: Payer: Self-pay | Admitting: Physician Assistant

## 2015-02-14 VITALS — BP 97/72 | HR 83 | Temp 98.2°F | Resp 18 | Ht 60.0 in | Wt 110.1 lb

## 2015-02-14 DIAGNOSIS — R110 Nausea: Secondary | ICD-10-CM

## 2015-02-14 DIAGNOSIS — Z171 Estrogen receptor negative status [ER-]: Secondary | ICD-10-CM

## 2015-02-14 DIAGNOSIS — C773 Secondary and unspecified malignant neoplasm of axilla and upper limb lymph nodes: Secondary | ICD-10-CM

## 2015-02-14 DIAGNOSIS — Z5111 Encounter for antineoplastic chemotherapy: Secondary | ICD-10-CM

## 2015-02-14 DIAGNOSIS — R197 Diarrhea, unspecified: Secondary | ICD-10-CM

## 2015-02-14 DIAGNOSIS — Z5112 Encounter for antineoplastic immunotherapy: Secondary | ICD-10-CM

## 2015-02-14 DIAGNOSIS — E860 Dehydration: Secondary | ICD-10-CM

## 2015-02-14 DIAGNOSIS — G43909 Migraine, unspecified, not intractable, without status migrainosus: Secondary | ICD-10-CM

## 2015-02-14 DIAGNOSIS — C50412 Malignant neoplasm of upper-outer quadrant of left female breast: Secondary | ICD-10-CM

## 2015-02-14 LAB — COMPREHENSIVE METABOLIC PANEL (CC13)
ALT: 34 U/L (ref 0–55)
ANION GAP: 10 meq/L (ref 3–11)
AST: 23 U/L (ref 5–34)
Albumin: 3.9 g/dL (ref 3.5–5.0)
Alkaline Phosphatase: 84 U/L (ref 40–150)
BUN: 12.3 mg/dL (ref 7.0–26.0)
CHLORIDE: 105 meq/L (ref 98–109)
CO2: 27 meq/L (ref 22–29)
Calcium: 10 mg/dL (ref 8.4–10.4)
Creatinine: 0.6 mg/dL (ref 0.6–1.1)
GLUCOSE: 107 mg/dL (ref 70–140)
Potassium: 4 mEq/L (ref 3.5–5.1)
SODIUM: 142 meq/L (ref 136–145)
Total Bilirubin: 0.29 mg/dL (ref 0.20–1.20)
Total Protein: 6.5 g/dL (ref 6.4–8.3)

## 2015-02-14 LAB — CBC WITH DIFFERENTIAL/PLATELET
BASO%: 0.1 % (ref 0.0–2.0)
Basophils Absolute: 0 10*3/uL (ref 0.0–0.1)
EOS%: 0 % (ref 0.0–7.0)
Eosinophils Absolute: 0 10*3/uL (ref 0.0–0.5)
HCT: 31.5 % — ABNORMAL LOW (ref 34.8–46.6)
HGB: 10 g/dL — ABNORMAL LOW (ref 11.6–15.9)
LYMPH%: 10.9 % — AB (ref 14.0–49.7)
MCH: 31.6 pg (ref 25.1–34.0)
MCHC: 31.7 g/dL (ref 31.5–36.0)
MCV: 99.7 fL (ref 79.5–101.0)
MONO#: 0.8 10*3/uL (ref 0.1–0.9)
MONO%: 5.7 % (ref 0.0–14.0)
NEUT#: 11.9 10*3/uL — ABNORMAL HIGH (ref 1.5–6.5)
NEUT%: 83.3 % — AB (ref 38.4–76.8)
PLATELETS: 127 10*3/uL — AB (ref 145–400)
RBC: 3.16 10*6/uL — AB (ref 3.70–5.45)
RDW: 16.9 % — ABNORMAL HIGH (ref 11.2–14.5)
WBC: 14.3 10*3/uL — ABNORMAL HIGH (ref 3.9–10.3)
lymph#: 1.6 10*3/uL (ref 0.9–3.3)

## 2015-02-14 MED ORDER — DOCETAXEL CHEMO INJECTION 160 MG/16ML
75.0000 mg/m2 | Freq: Once | INTRAVENOUS | Status: AC
  Administered 2015-02-14: 110 mg via INTRAVENOUS
  Filled 2015-02-14: qty 11

## 2015-02-14 MED ORDER — SODIUM CHLORIDE 0.9 % IV SOLN
420.0000 mg | Freq: Once | INTRAVENOUS | Status: AC
  Administered 2015-02-14: 420 mg via INTRAVENOUS
  Filled 2015-02-14: qty 14

## 2015-02-14 MED ORDER — PROMETHAZINE HCL 25 MG/ML IJ SOLN
25.0000 mg | Freq: Every day | INTRAMUSCULAR | Status: DC
  Administered 2015-02-14: 25 mg via INTRAVENOUS
  Filled 2015-02-14: qty 1

## 2015-02-14 MED ORDER — ACETAMINOPHEN 325 MG PO TABS
ORAL_TABLET | ORAL | Status: AC
  Filled 2015-02-14: qty 2

## 2015-02-14 MED ORDER — ACETAMINOPHEN 325 MG PO TABS
650.0000 mg | ORAL_TABLET | Freq: Once | ORAL | Status: AC
  Administered 2015-02-14: 650 mg via ORAL

## 2015-02-14 MED ORDER — DIPHENHYDRAMINE HCL 25 MG PO CAPS
25.0000 mg | ORAL_CAPSULE | Freq: Once | ORAL | Status: AC
  Administered 2015-02-14: 25 mg via ORAL

## 2015-02-14 MED ORDER — FOSAPREPITANT DIMEGLUMINE INJECTION 150 MG
Freq: Once | INTRAVENOUS | Status: AC
  Administered 2015-02-14: 11:00:00 via INTRAVENOUS
  Filled 2015-02-14: qty 5

## 2015-02-14 MED ORDER — SODIUM CHLORIDE 0.9 % IJ SOLN
10.0000 mL | INTRAMUSCULAR | Status: DC | PRN
  Administered 2015-02-14: 10 mL
  Filled 2015-02-14: qty 10

## 2015-02-14 MED ORDER — SODIUM CHLORIDE 0.9 % IJ SOLN
10.0000 mL | INTRAMUSCULAR | Status: DC | PRN
  Filled 2015-02-14: qty 10

## 2015-02-14 MED ORDER — SODIUM CHLORIDE 0.9 % IV SOLN
Freq: Once | INTRAVENOUS | Status: AC
  Administered 2015-02-14: 11:00:00 via INTRAVENOUS

## 2015-02-14 MED ORDER — DIPHENHYDRAMINE HCL 25 MG PO CAPS
ORAL_CAPSULE | ORAL | Status: AC
  Filled 2015-02-14: qty 1

## 2015-02-14 MED ORDER — TRASTUZUMAB CHEMO INJECTION 440 MG
6.0000 mg/kg | Freq: Once | INTRAVENOUS | Status: AC
  Administered 2015-02-14: 315 mg via INTRAVENOUS
  Filled 2015-02-14: qty 15

## 2015-02-14 MED ORDER — SODIUM CHLORIDE 0.9 % IV SOLN
539.4000 mg | Freq: Once | INTRAVENOUS | Status: AC
  Administered 2015-02-14: 540 mg via INTRAVENOUS
  Filled 2015-02-14: qty 54

## 2015-02-14 MED ORDER — HEPARIN SOD (PORK) LOCK FLUSH 100 UNIT/ML IV SOLN
500.0000 [IU] | Freq: Once | INTRAVENOUS | Status: DC | PRN
  Filled 2015-02-14: qty 5

## 2015-02-14 MED ORDER — SODIUM CHLORIDE 0.9 % IV SOLN: Freq: Once | INTRAVENOUS | Status: DC

## 2015-02-14 MED ORDER — PEGFILGRASTIM 6 MG/0.6ML ~~LOC~~ PSKT
6.0000 mg | PREFILLED_SYRINGE | Freq: Once | SUBCUTANEOUS | Status: AC
  Administered 2015-02-14: 6 mg via SUBCUTANEOUS
  Filled 2015-02-14: qty 0.6

## 2015-02-14 MED ORDER — HEPARIN SOD (PORK) LOCK FLUSH 100 UNIT/ML IV SOLN
500.0000 [IU] | Freq: Once | INTRAVENOUS | Status: AC | PRN
  Administered 2015-02-14: 500 [IU]
  Filled 2015-02-14: qty 5

## 2015-02-14 NOTE — Progress Notes (Signed)
Patient Care Team: Laray Anger, MD as PCP - General (Obstetrics and Gynecology) Erroll Luna, MD as Consulting Physician (General Surgery) Nicholas Lose, MD as Consulting Physician (Hematology and Oncology) Gery Pray, MD as Consulting Physician (Radiation Oncology) Mauro Kaufmann, RN as Registered Nurse Rockwell Germany, RN as Registered Nurse  DIAGNOSIS: Breast cancer of upper-outer quadrant of left female breast   Staging form: Breast, AJCC 7th Edition     Clinical stage from 10/16/2014: Stage IIA (T1c, N1, M0) - Unsigned   SUMMARY OF ONCOLOGIC HISTORY:   Breast cancer of upper-outer quadrant of left female breast   10/01/2014 Mammogram Left breast mammogram and ultrasound for palpable left breast mass which was a maxillary mass with an ill-defined abnormality by ultrasound 1.7 cm at 2:00   10/11/2014 Initial Diagnosis Left breast invasive ductal carcinoma, grade 3, ER/PR negative, 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 5.32, axillary lymph node also positive for cancer   10/30/2014 Imaging CT chest abdomen pelvis and bone scan revealed no evidence of distant metastatic disease but enlarged left axillary and left subpectoral lymph nodes   11/01/2014 -  Neo-Adjuvant Chemotherapy Neoadjuvant TCH Perjeta to 3 weeks 6 cycles followed by Herceptin maintenance    CHIEF COMPLIANT: Cycle 5 TCH Perjeta  INTERVAL HISTORY: [name] [name] is a 54 year old with above-mentioned history of left breast cancer currently receiving chemotherapy and today is cycle 6 of [place]. She continues to be troubled with diarrhea after chemotherapy. She reports up to 6 episodes in a 24 hour period. The diarrhea lasts 3-4 days. She reports a history of hemorrhoids and an anal fissure which have been aggravated by the frequent diarrhea.Her migraines are well controlled while she is on dexamethasone, currently 2 mg by mouth daily. She denies any current nausea or vomiting.  REVIEW OF SYSTEMS:   Constitutional: Denies fevers, chills or  abnormal weight loss Eyes: Denies blurriness of vision Ears, nose, mouth, throat, and face: Denies mucositis or sore throat Respiratory: Denies cough, dyspnea or wheezes Cardiovascular: Denies palpitation, chest discomfort or lower extremity swelling Gastrointestinal:  Denies nausea, heartburn or change in bowel habits Skin: Denies abnormal skin rashes Lymphatics: Denies new lymphadenopathy or easy bruising Neurological:Denies numbness, tingling or new weaknesses Behavioral/Psych: Mood is stable, no new changes, migraines  All other systems were reviewed with the patient and are negative.  I have reviewed the past medical history, past surgical history, social history and family history with the patient and they are unchanged from previous note.  ALLERGIES:  is allergic to butorphanol; meperidine; and sulfa antibiotics.  MEDICATIONS:  Current Outpatient Prescriptions  Medication Sig Dispense Refill  . acetaminophen (TYLENOL) 325 MG tablet Take 650 mg by mouth every 6 (six) hours as needed.    . Aspirin-Acetaminophen-Caffeine (GOODYS EXTRA STRENGTH PO) Take by mouth.    Marland Kitchen atenolol-chlorthalidone (TENORETIC) 50-25 MG per tablet Take by mouth. Pt takes 75-25 mg of this medication    . B Complex-Biotin-FA (B-COMPLEX PO) Take by mouth.    Marland Kitchen BLACK COHOSH PO Take by mouth.    . Calcium Carbonate (CALCIUM 600 PO) Take by mouth.    . dexamethasone (DECADRON) 2 MG tablet Take 2 mg by mouth. Takes daily for migraines until prescribed 4 mg regimen starts    . dexamethasone (DECADRON) 4 MG tablet Take 1 tablet (4 mg total) by mouth 2 (two) times daily. Start the day before Taxotere. Then again the day after chemo for 3 days. 30 tablet 1  . Diclofenac Potassium (CAMBIA PO) Take by mouth  daily as needed.    . diphenoxylate-atropine (LOMOTIL) 2.5-0.025 MG per tablet Take 1 tablet by mouth 4 (four) times daily as needed for diarrhea or loose stools. 30 tablet 0  . eletriptan (RELPAX) 40 MG tablet Take 1  tablet (40 mg total) by mouth every 2 (two) hours as needed for migraine or headache (Max 2 per day). 10 tablet 3  . EVENING PRIMROSE OIL PO Take 1,300 mg by mouth.    . hydrocortisone 2.5 % ointment Apply topically 2 (two) times daily. 30 g 0  . ibuprofen (ADVIL,MOTRIN) 100 MG tablet Take 100 mg by mouth every 6 (six) hours as needed for fever.    . Lactobacillus (ACIDOPHILUS PO) Take by mouth.    . lidocaine-prilocaine (EMLA) cream Apply to affected area once 30 g 3  . LORazepam (ATIVAN) 0.5 MG tablet Take 1 tablet (0.5 mg total) by mouth every 6 (six) hours as needed (Nausea or vomiting). 45 tablet 0  . oxyCODONE-acetaminophen (ROXICET) 5-325 MG per tablet Take 1 tablet by mouth every 4 (four) hours as needed. 30 tablet 0  . PARoxetine (PAXIL) 20 MG tablet Take 20 mg by mouth.    . prochlorperazine (COMPAZINE) 10 MG tablet Take 1 tablet (10 mg total) by mouth every 6 (six) hours as needed (Nausea or vomiting). 30 tablet 1  . promethazine (PHENERGAN) 12.5 MG tablet Take 1 tablet (12.5 mg total) by mouth every 6 (six) hours as needed for nausea or vomiting. 90 tablet 3  . solifenacin (VESICARE) 5 MG tablet Take 10 mg by mouth.    Marland Kitchen VITAMIN D, CHOLECALCIFEROL, PO Take by mouth.     No current facility-administered medications for this visit.   Facility-Administered Medications Ordered in Other Visits  Medication Dose Route Frequency Provider Last Rate Last Dose  . 0.9 %  sodium chloride infusion   Intravenous Once Nicholas Lose, MD      . heparin lock flush 100 unit/mL  500 Units Intracatheter Once PRN Nicholas Lose, MD      . heparin lock flush 100 unit/mL  500 Units Intracatheter Once PRN Nicholas Lose, MD      . pegfilgrastim (NEULASTA ONPRO KIT) injection 6 mg  6 mg Subcutaneous Once Nicholas Lose, MD      . promethazine (PHENERGAN) injection 25 mg  25 mg Intravenous Daily Nicholas Lose, MD   25 mg at 02/14/15 1111  . sodium chloride 0.9 % injection 10 mL  10 mL Intracatheter PRN Nicholas Lose,  MD      . sodium chloride 0.9 % injection 10 mL  10 mL Intracatheter PRN Nicholas Lose, MD        PHYSICAL EXAMINATION: ECOG PERFORMANCE STATUS: 1 - Symptomatic but completely ambulatory  Filed Vitals:   02/14/15 0912  BP: 97/72  Pulse: 83  Temp: 98.2 F (36.8 C)  Resp: 18   Filed Weights   02/14/15 0912  Weight: 110 lb 1.6 oz (49.941 kg)    GENERAL:alert, no distress and comfortable SKIN: skin color, texture, turgor are normal, no rashes or significant lesions EYES: normal, Conjunctiva are pink and non-injected, sclera clear OROPHARYNX:no exudate, no erythema and lips, buccal mucosa, and tongue normal  NECK: supple, thyroid normal size, non-tender, without nodularity LYMPH:  no palpable lymphadenopathy in the cervical, axillary or inguinal LUNGS: clear to auscultation and percussion with normal breathing effort HEART: regular rate & rhythm and no murmurs and no lower extremity edema ABDOMEN:abdomen soft, non-tender and normal bowel sounds Musculoskeletal:no cyanosis of digits and  no clubbing  NEURO: alert & oriented x 3 with fluent speech, no focal motor/sensory deficits  LABORATORY DATA:  I have reviewed the data as listed   Chemistry      Component Value Date/Time   NA 142 02/14/2015 0847   K 4.0 02/14/2015 0847   CO2 27 02/14/2015 0847   BUN 12.3 02/14/2015 0847   CREATININE 0.6 02/14/2015 0847      Component Value Date/Time   CALCIUM 10.0 02/14/2015 0847   ALKPHOS 84 02/14/2015 0847   AST 23 02/14/2015 0847   ALT 34 02/14/2015 0847   BILITOT 0.29 02/14/2015 0847       Lab Results  Component Value Date   WBC 14.3* 02/14/2015   HGB 10.0* 02/14/2015   HCT 31.5* 02/14/2015   MCV 99.7 02/14/2015   PLT 127* 02/14/2015   NEUTROABS 11.9* 02/14/2015   ASSESSMENT & PLAN:  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 Severe Migraine after first cycle of treatment: saw neurologist; it was excruciating pain that lasted for 2 weeks apparently responded to steroids. Steroids daily are able to control her migraines the best 2. Severe nausea but no vomiting related to migraine 3. Diarrhea: liquid watery stools: On imodium andLomotil. 4. Nausea: much improved  Plan: 1. Return on 8/22 and 8/29 for IV fluids 2. Decadron daily 2 mg 3. Keep appointments for breast MRI and chest CT 4. Try increasing daily dietary fiber to increase bulk and help with diarrhea Return to clinic in 3 weeks for follow-up       No orders of the defined types were placed in this encounter.   The patient has a good understanding of the overall plan. she agrees with it. she will call with any problems that may develop before the next visit here.   Carlton Adam, PA-C  02/14/2015

## 2015-02-14 NOTE — Patient Instructions (Signed)
[facility] Discharge Instructions for Patients Receiving Chemotherapy  Today you received the following chemotherapy agents taxotere, carboplatin, herceptin and perjeta.  To help prevent nausea and vomiting after your treatment, we encourage you to take your nausea medication.   If you develop nausea and vomiting that is not controlled by your nausea medication, call the clinic.   BELOW ARE SYMPTOMS THAT SHOULD BE REPORTED IMMEDIATELY:  *FEVER GREATER THAN 100.5 F  *CHILLS WITH OR WITHOUT FEVER  NAUSEA AND VOMITING THAT IS NOT CONTROLLED WITH YOUR NAUSEA MEDICATION  *UNUSUAL SHORTNESS OF BREATH  *UNUSUAL BRUISING OR BLEEDING  TENDERNESS IN MOUTH AND THROAT WITH OR WITHOUT PRESENCE OF ULCERS  *URINARY PROBLEMS  *BOWEL PROBLEMS  UNUSUAL RASH Items with * indicate a potential emergency and should be followed up as soon as possible.  Feel free to call the clinic you have any questions or concerns. The clinic phone number is [phone].  Please show the [place] at check-in to the Emergency Department and triage nurse.

## 2015-02-14 NOTE — Telephone Encounter (Signed)
Added appt per pof...the patient will get sched in chemo °

## 2015-02-17 ENCOUNTER — Ambulatory Visit
Admission: RE | Admit: 2015-02-17 | Discharge: 2015-02-17 | Disposition: A | Discharge status: Home / Self Care | Payer: PRIVATE HEALTH INSURANCE | Source: Ambulatory Visit | Attending: Hematology and Oncology | Admitting: Hematology and Oncology

## 2015-02-17 ENCOUNTER — Telehealth: Payer: Self-pay | Admitting: Hematology and Oncology

## 2015-02-17 DIAGNOSIS — C50412 Malignant neoplasm of upper-outer quadrant of left female breast: Secondary | ICD-10-CM

## 2015-02-17 MED ORDER — GADOBENATE DIMEGLUMINE 529 MG/ML IV SOLN
9.0000 mL | Freq: Once | INTRAVENOUS | Status: AC | PRN
  Administered 2015-02-17: 9 mL via INTRAVENOUS

## 2015-02-17 NOTE — Telephone Encounter (Signed)
Moved her ivf on 8/29 per patient

## 2015-02-18 ENCOUNTER — Other Ambulatory Visit: Payer: PRIVATE HEALTH INSURANCE

## 2015-02-18 ENCOUNTER — Ambulatory Visit (HOSPITAL_BASED_OUTPATIENT_CLINIC_OR_DEPARTMENT_OTHER): Payer: PRIVATE HEALTH INSURANCE

## 2015-02-18 ENCOUNTER — Ambulatory Visit (HOSPITAL_COMMUNITY)
Admission: RE | Admit: 2015-02-18 | Discharge: 2015-02-18 | Disposition: A | Discharge status: Home / Self Care | Payer: PRIVATE HEALTH INSURANCE | Source: Ambulatory Visit | Attending: Hematology and Oncology | Admitting: Hematology and Oncology

## 2015-02-18 VITALS — BP 104/59 | HR 79 | Temp 97.8°F | Resp 20

## 2015-02-18 DIAGNOSIS — R197 Diarrhea, unspecified: Secondary | ICD-10-CM

## 2015-02-18 DIAGNOSIS — Z9221 Personal history of antineoplastic chemotherapy: Secondary | ICD-10-CM | POA: Insufficient documentation

## 2015-02-18 DIAGNOSIS — R110 Nausea: Secondary | ICD-10-CM | POA: Diagnosis not present

## 2015-02-18 DIAGNOSIS — C50412 Malignant neoplasm of upper-outer quadrant of left female breast: Secondary | ICD-10-CM

## 2015-02-18 DIAGNOSIS — C773 Secondary and unspecified malignant neoplasm of axilla and upper limb lymph nodes: Secondary | ICD-10-CM

## 2015-02-18 MED ORDER — IOHEXOL 300 MG/ML  SOLN
75.0000 mL | Freq: Once | INTRAMUSCULAR | Status: AC | PRN
  Administered 2015-02-18: 75 mL via INTRAVENOUS

## 2015-02-18 MED ORDER — HEPARIN SOD (PORK) LOCK FLUSH 100 UNIT/ML IV SOLN
500.0000 [IU] | Freq: Once | INTRAVENOUS | Status: AC
  Administered 2015-02-18: 500 [IU] via INTRAVENOUS
  Filled 2015-02-18: qty 5

## 2015-02-18 MED ORDER — SODIUM CHLORIDE 0.9 % IJ SOLN
10.0000 mL | INTRAMUSCULAR | Status: DC | PRN
  Administered 2015-02-18: 10 mL via INTRAVENOUS
  Filled 2015-02-18: qty 10

## 2015-02-18 MED ORDER — SODIUM CHLORIDE 0.9 % IV SOLN
INTRAVENOUS | Status: DC
  Administered 2015-02-18: 10:00:00 via INTRAVENOUS

## 2015-02-18 NOTE — Patient Instructions (Signed)
Dehydration, Adult Dehydration is when you lose more fluids from the body than you take in. Vital organs like the kidneys, brain, and heart cannot function without a proper amount of fluids and salt. Any loss of fluids from the body can cause dehydration.  CAUSES   Vomiting.  Diarrhea.  Excessive sweating.  Excessive urine output.  Fever. SYMPTOMS  Mild dehydration  Thirst.  Dry lips.  Slightly dry mouth. Moderate dehydration  Very dry mouth.  Sunken eyes.  Skin does not bounce back quickly when lightly pinched and released.  Dark urine and decreased urine production.  Decreased tear production.  Headache. Severe dehydration  Very dry mouth.  Extreme thirst.  Rapid, weak pulse (more than 100 beats per minute at rest).  Cold hands and feet.  Not able to sweat in spite of heat and temperature.  Rapid breathing.  Blue lips.  Confusion and lethargy.  Difficulty being awakened.  Minimal urine production.  No tears. DIAGNOSIS  Your caregiver will diagnose dehydration based on your symptoms and your exam. Blood and urine tests will help confirm the diagnosis. The diagnostic evaluation should also identify the cause of dehydration. TREATMENT  Treatment of mild or moderate dehydration can often be done at home by increasing the amount of fluids that you drink. It is best to drink small amounts of fluid more often. Drinking too much at one time can make vomiting worse. Refer to the home care instructions below. Severe dehydration needs to be treated at the hospital where you will probably be given intravenous (IV) fluids that contain water and electrolytes. HOME CARE INSTRUCTIONS   Ask your caregiver about specific rehydration instructions.  Drink enough fluids to keep your urine clear or pale yellow.  Drink small amounts frequently if you have nausea and vomiting.  Eat as you normally do.  Avoid:  Foods or drinks high in sugar.  Carbonated  drinks.  Juice.  Extremely hot or cold fluids.  Drinks with caffeine.  Fatty, greasy foods.  Alcohol.  Tobacco.  Overeating.  Gelatin desserts.  Wash your hands well to avoid spreading bacteria and viruses.  Only take over-the-counter or prescription medicines for pain, discomfort, or fever as directed by your caregiver.  Ask your caregiver if you should continue all prescribed and over-the-counter medicines.  Keep all follow-up appointments with your caregiver. SEEK MEDICAL CARE IF:  You have abdominal pain and it increases or stays in one area (localizes).  You have a rash, stiff neck, or severe headache.  You are irritable, sleepy, or difficult to awaken.  You are weak, dizzy, or extremely thirsty. SEEK IMMEDIATE MEDICAL CARE IF:   You are unable to keep fluids down or you get worse despite treatment.  You have frequent episodes of vomiting or diarrhea.  You have blood or green matter (bile) in your vomit.  You have blood in your stool or your stool looks black and tarry.  You have not urinated in 6 to 8 hours, or you have only urinated a small amount of very dark urine.  You have a fever.  You faint. MAKE SURE YOU:   Understand these instructions.  Will watch your condition.  Will get help right away if you are not doing well or get worse. Document Released: 06/14/2005 Document Revised: 09/06/2011 Document Reviewed: 02/01/2011 Exit[employer] Patient Information 2015 [employer]. This information is not intended to replace advice given to you by your health care provider. Make sure you discuss any questions you have with your health care   provider.  

## 2015-02-18 NOTE — Progress Notes (Signed)
Pt states has caught a cold. Pt instructed to increase fluids, monitor temperature, and call the [place] if symptoms worsen or do not improve.

## 2015-02-20 NOTE — Patient Instructions (Signed)
Keep appointments for breast MRI and chest CT Follow up in 3 weeks

## 2015-02-21 ENCOUNTER — Ambulatory Visit (HOSPITAL_BASED_OUTPATIENT_CLINIC_OR_DEPARTMENT_OTHER): Payer: PRIVATE HEALTH INSURANCE | Admitting: Hematology and Oncology

## 2015-02-21 ENCOUNTER — Telehealth: Payer: Self-pay | Admitting: Hematology and Oncology

## 2015-02-21 VITALS — BP 99/64 | HR 100 | Temp 97.5°F | Resp 18 | Ht 60.0 in | Wt 106.8 lb

## 2015-02-21 DIAGNOSIS — J069 Acute upper respiratory infection, unspecified: Secondary | ICD-10-CM | POA: Diagnosis not present

## 2015-02-21 DIAGNOSIS — D6481 Anemia due to antineoplastic chemotherapy: Secondary | ICD-10-CM

## 2015-02-21 DIAGNOSIS — C773 Secondary and unspecified malignant neoplasm of axilla and upper limb lymph nodes: Secondary | ICD-10-CM

## 2015-02-21 DIAGNOSIS — R197 Diarrhea, unspecified: Secondary | ICD-10-CM

## 2015-02-21 DIAGNOSIS — T451X5A Adverse effect of antineoplastic and immunosuppressive drugs, initial encounter: Principal | ICD-10-CM

## 2015-02-21 DIAGNOSIS — C50412 Malignant neoplasm of upper-outer quadrant of left female breast: Secondary | ICD-10-CM | POA: Diagnosis not present

## 2015-02-21 DIAGNOSIS — Z171 Estrogen receptor negative status [ER-]: Secondary | ICD-10-CM

## 2015-02-21 DIAGNOSIS — R110 Nausea: Secondary | ICD-10-CM

## 2015-02-21 DIAGNOSIS — E860 Dehydration: Secondary | ICD-10-CM

## 2015-02-21 MED ORDER — LEVOFLOXACIN 500 MG PO TABS
500.0000 mg | ORAL_TABLET | Freq: Every day | ORAL | Status: DC
Start: 2015-02-21 — End: 2015-03-11

## 2015-02-21 NOTE — Progress Notes (Signed)
Patient Care Team: Laray Anger, MD as PCP - General (Obstetrics and Gynecology) Erroll Luna, MD as Consulting Physician (General Surgery) Nicholas Lose, MD as Consulting Physician (Hematology and Oncology) Gery Pray, MD as Consulting Physician (Radiation Oncology) Mauro Kaufmann, RN as Registered Nurse Rockwell Germany, RN as Registered Nurse  DIAGNOSIS: Breast cancer of upper-outer quadrant of left female breast   Staging form: Breast, AJCC 7th Edition     Clinical stage from 10/16/2014: Stage IIA (T1c, N1, M0) - Unsigned   SUMMARY OF ONCOLOGIC HISTORY:   Breast cancer of upper-outer quadrant of left female breast   10/01/2014 Mammogram Left breast mammogram and ultrasound for palpable left breast mass which was a maxillary mass with an ill-defined abnormality by ultrasound 1.7 cm at 2:00   10/11/2014 Initial Diagnosis Left breast invasive ductal carcinoma, grade 3, ER/PR negative, 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 5.32, axillary lymph node also positive for cancer   10/30/2014 Imaging CT chest abdomen pelvis and bone scan revealed no evidence of distant metastatic disease but enlarged left axillary and left subpectoral lymph nodes   11/01/2014 -  Neo-Adjuvant Chemotherapy Neoadjuvant TCH Perjeta to 3 weeks 6 cycles followed by Herceptin maintenance    CHIEF COMPLIANT: Follow-up after breast MRI and CT chest  INTERVAL HISTORY: [name] [name] is a 54 year old with above-mentioned history of left breast cancer completed Joint chemotherapy and is here today to discuss the results of the breast MRI and CT chest. She complains of foot upper respiratory infection. She has mild peripheral neuropathy. She continues to feel dehydrated. Her migraines have finally settled down with dexamethasone.  REVIEW OF SYSTEMS:   Constitutional: Denies fevers, chills or abnormal weight loss Eyes: Denies blurriness of vision Ears, nose, mouth, throat, and face: Denies mucositis or sore throat Respiratory: Denies cough,  dyspnea or wheezes Cardiovascular: Denies palpitation, chest discomfort or lower extremity swelling Gastrointestinal:  Denies nausea, heartburn or change in bowel habits Skin: Denies abnormal skin rashes Lymphatics: Denies new lymphadenopathy or easy bruising Neurological: Mild peripheral neuropathy and migraines Behavioral/Psych: Mood is stable, no new changes  Breast: Cannot feel any lumps or nodules in the breast or axilla  All other systems were reviewed with the patient and are negative.  I have reviewed the past medical history, past surgical history, social history and family history with the patient and they are unchanged from previous note.  ALLERGIES:  is allergic to butorphanol; meperidine; and sulfa antibiotics.  MEDICATIONS:  Current Outpatient Prescriptions  Medication Sig Dispense Refill  . acetaminophen (TYLENOL) 325 MG tablet Take 650 mg by mouth every 6 (six) hours as needed.    . Aspirin-Acetaminophen-Caffeine (GOODYS EXTRA STRENGTH PO) Take by mouth.    Marland Kitchen atenolol-chlorthalidone (TENORETIC) 50-25 MG per tablet Take by mouth. Pt takes 75-25 mg of this medication    . B Complex-Biotin-FA (B-COMPLEX PO) Take by mouth.    Marland Kitchen BLACK COHOSH PO Take by mouth.    . Calcium Carbonate (CALCIUM 600 PO) Take by mouth.    . dexamethasone (DECADRON) 2 MG tablet Take 2 mg by mouth. Takes daily for migraines until prescribed 4 mg regimen starts    . dexamethasone (DECADRON) 4 MG tablet Take 1 tablet (4 mg total) by mouth 2 (two) times daily. Start the day before Taxotere. Then again the day after chemo for 3 days. 30 tablet 1  . Diclofenac Potassium (CAMBIA PO) Take by mouth daily as needed.    . diphenoxylate-atropine (LOMOTIL) 2.5-0.025 MG per tablet Take 1 tablet by mouth  4 (four) times daily as needed for diarrhea or loose stools. 30 tablet 0  . eletriptan (RELPAX) 40 MG tablet Take 1 tablet (40 mg total) by mouth every 2 (two) hours as needed for migraine or headache (Max 2 per  day). 10 tablet 3  . EVENING PRIMROSE OIL PO Take 1,300 mg by mouth.    . hydrocortisone 2.5 % ointment Apply topically 2 (two) times daily. 30 g 0  . ibuprofen (ADVIL,MOTRIN) 100 MG tablet Take 100 mg by mouth every 6 (six) hours as needed for fever.    . Lactobacillus (ACIDOPHILUS PO) Take by mouth.    . lidocaine-prilocaine (EMLA) cream Apply to affected area once 30 g 3  . LORazepam (ATIVAN) 0.5 MG tablet Take 1 tablet (0.5 mg total) by mouth every 6 (six) hours as needed (Nausea or vomiting). 45 tablet 0  . oxyCODONE-acetaminophen (ROXICET) 5-325 MG per tablet Take 1 tablet by mouth every 4 (four) hours as needed. 30 tablet 0  . PARoxetine (PAXIL) 20 MG tablet Take 20 mg by mouth.    . prochlorperazine (COMPAZINE) 10 MG tablet Take 1 tablet (10 mg total) by mouth every 6 (six) hours as needed (Nausea or vomiting). 30 tablet 1  . promethazine (PHENERGAN) 12.5 MG tablet Take 1 tablet (12.5 mg total) by mouth every 6 (six) hours as needed for nausea or vomiting. 90 tablet 3  . solifenacin (VESICARE) 5 MG tablet Take 10 mg by mouth.    Marland Kitchen VITAMIN D, CHOLECALCIFEROL, PO Take by mouth.    . levofloxacin (LEVAQUIN) 500 MG tablet Take 1 tablet (500 mg total) by mouth daily. 7 tablet 0   No current facility-administered medications for this visit.    PHYSICAL EXAMINATION: ECOG PERFORMANCE STATUS: 1 - Symptomatic but completely ambulatory  Filed Vitals:   02/21/15 0845  BP: 99/64  Pulse: 100  Temp: 97.5 F (36.4 C)  Resp: 18   Filed Weights   02/21/15 0845  Weight: 106 lb 12.8 oz (48.444 kg)    GENERAL:alert, no distress and comfortable SKIN: skin color, texture, turgor are normal, no rashes or significant lesions EYES: normal, Conjunctiva are pink and non-injected, sclera clear OROPHARYNX: Cough with expectoration  NECK: supple, thyroid normal size, non-tender, without nodularity LYMPH:  no palpable lymphadenopathy in the cervical, axillary or inguinal LUNGS: clear to auscultation  and percussion with normal breathing effort HEART: regular rate & rhythm and no murmurs and no lower extremity edema ABDOMEN:abdomen soft, non-tender and normal bowel sounds Musculoskeletal:no cyanosis of digits and no clubbing  NEURO: alert & oriented x 3 with fluent speech, grade 1 peripheral neuropathy  LABORATORY DATA:  I have reviewed the data as listed   Chemistry      Component Value Date/Time   NA 142 02/14/2015 0847   K 4.0 02/14/2015 0847   CO2 27 02/14/2015 0847   BUN 12.3 02/14/2015 0847   CREATININE 0.6 02/14/2015 0847      Component Value Date/Time   CALCIUM 10.0 02/14/2015 0847   ALKPHOS 84 02/14/2015 0847   AST 23 02/14/2015 0847   ALT 34 02/14/2015 0847   BILITOT 0.29 02/14/2015 0847       Lab Results  Component Value Date   WBC 14.3* 02/14/2015   HGB 10.0* 02/14/2015   HCT 31.5* 02/14/2015   MCV 99.7 02/14/2015   PLT 127* 02/14/2015   NEUTROABS 11.9* 02/14/2015   ASSESSMENT & PLAN:  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 Tumor board presentation next Wednesday 2. Follow-up with Dr. Brantley Stage discussed surgical options. Patient initially thought she would want like to do bilateral mastectomies. But I discussed with her that a lumpectomy could be an option depending on what the tumor board recommends. 3. I discussed with the patient briefly the Alliance clinical trial ALLIANCE [id number]: Phase 3 clinical trial  in patients with node positive disease after neoadjuvant chemotherapy, if patient is positive for sentinel lymph node determined by intraoperative pathology on final pathology if axillary lymph node dissection was not performed, randomized to axillary lymph node dissection with nodal radiation versus axillary radiation and nodal radiation 4. Continue with maintenance Herceptin every 3 weekstreatment will be on 03/07/2015  Upper respiratory infection: I prescribed Levaquin for 1 week No orders of the defined types were placed in this encounter.   The patient has a good understanding of the overall plan. she agrees with it. she will call with any problems that may develop before the next visit here.   Rulon Eisenmenger, MD

## 2015-02-21 NOTE — Telephone Encounter (Signed)
inbox to dr Lindi Adie as he is not here 9/9 and heather is booked,will advise patient at that time

## 2015-02-24 ENCOUNTER — Ambulatory Visit: Payer: PRIVATE HEALTH INSURANCE

## 2015-02-24 ENCOUNTER — Telehealth: Payer: Self-pay | Admitting: Hematology and Oncology

## 2015-02-24 ENCOUNTER — Ambulatory Visit (HOSPITAL_BASED_OUTPATIENT_CLINIC_OR_DEPARTMENT_OTHER): Payer: PRIVATE HEALTH INSURANCE

## 2015-02-24 ENCOUNTER — Other Ambulatory Visit: Payer: Self-pay | Admitting: *Deleted

## 2015-02-24 VITALS — BP 96/61 | HR 87 | Temp 97.7°F | Resp 20

## 2015-02-24 DIAGNOSIS — R197 Diarrhea, unspecified: Secondary | ICD-10-CM | POA: Diagnosis not present

## 2015-02-24 DIAGNOSIS — C50412 Malignant neoplasm of upper-outer quadrant of left female breast: Secondary | ICD-10-CM | POA: Diagnosis not present

## 2015-02-24 DIAGNOSIS — E860 Dehydration: Secondary | ICD-10-CM | POA: Diagnosis not present

## 2015-02-24 DIAGNOSIS — C773 Secondary and unspecified malignant neoplasm of axilla and upper limb lymph nodes: Secondary | ICD-10-CM

## 2015-02-24 MED ORDER — SODIUM CHLORIDE 0.9 % IV SOLN
INTRAVENOUS | Status: DC
  Administered 2015-02-24: 13:00:00 via INTRAVENOUS

## 2015-02-24 MED ORDER — HEPARIN SOD (PORK) LOCK FLUSH 100 UNIT/ML IV SOLN
500.0000 [IU] | Freq: Once | INTRAVENOUS | Status: AC
  Administered 2015-02-24: 500 [IU] via INTRAVENOUS
  Filled 2015-02-24: qty 5

## 2015-02-24 MED ORDER — SODIUM CHLORIDE 0.9 % IJ SOLN
10.0000 mL | INTRAMUSCULAR | Status: DC | PRN
  Administered 2015-02-24: 10 mL via INTRAVENOUS
  Filled 2015-02-24: qty 10

## 2015-02-24 NOTE — Patient Instructions (Signed)
Dehydration, Adult Dehydration is when you lose more fluids from the body than you take in. Vital organs like the kidneys, brain, and heart cannot function without a proper amount of fluids and salt. Any loss of fluids from the body can cause dehydration.  CAUSES   Vomiting.  Diarrhea.  Excessive sweating.  Excessive urine output.  Fever. SYMPTOMS  Mild dehydration  Thirst.  Dry lips.  Slightly dry mouth. Moderate dehydration  Very dry mouth.  Sunken eyes.  Skin does not bounce back quickly when lightly pinched and released.  Dark urine and decreased urine production.  Decreased tear production.  Headache. Severe dehydration  Very dry mouth.  Extreme thirst.  Rapid, weak pulse (more than 100 beats per minute at rest).  Cold hands and feet.  Not able to sweat in spite of heat and temperature.  Rapid breathing.  Blue lips.  Confusion and lethargy.  Difficulty being awakened.  Minimal urine production.  No tears. DIAGNOSIS  Your caregiver will diagnose dehydration based on your symptoms and your exam. Blood and urine tests will help confirm the diagnosis. The diagnostic evaluation should also identify the cause of dehydration. TREATMENT  Treatment of mild or moderate dehydration can often be done at home by increasing the amount of fluids that you drink. It is best to drink small amounts of fluid more often. Drinking too much at one time can make vomiting worse. Refer to the home care instructions below. Severe dehydration needs to be treated at the hospital where you will probably be given intravenous (IV) fluids that contain water and electrolytes. HOME CARE INSTRUCTIONS   Ask your caregiver about specific rehydration instructions.  Drink enough fluids to keep your urine clear or pale yellow.  Drink small amounts frequently if you have nausea and vomiting.  Eat as you normally do.  Avoid:  Foods or drinks high in sugar.  Carbonated  drinks.  Juice.  Extremely hot or cold fluids.  Drinks with caffeine.  Fatty, greasy foods.  Alcohol.  Tobacco.  Overeating.  Gelatin desserts.  Wash your hands well to avoid spreading bacteria and viruses.  Only take over-the-counter or prescription medicines for pain, discomfort, or fever as directed by your caregiver.  Ask your caregiver if you should continue all prescribed and over-the-counter medicines.  Keep all follow-up appointments with your caregiver. SEEK MEDICAL CARE IF:  You have abdominal pain and it increases or stays in one area (localizes).  You have a rash, stiff neck, or severe headache.  You are irritable, sleepy, or difficult to awaken.  You are weak, dizzy, or extremely thirsty. SEEK IMMEDIATE MEDICAL CARE IF:   You are unable to keep fluids down or you get worse despite treatment.  You have frequent episodes of vomiting or diarrhea.  You have blood or green matter (bile) in your vomit.  You have blood in your stool or your stool looks black and tarry.  You have not urinated in 6 to 8 hours, or you have only urinated a small amount of very dark urine.  You have a fever.  You faint. MAKE SURE YOU:   Understand these instructions.  Will watch your condition.  Will get help right away if you are not doing well or get worse. Document Released: 06/14/2005 Document Revised: 09/06/2011 Document Reviewed: 02/01/2011 Exit[employer] Patient Information 2015 [employer]. This information is not intended to replace advice given to you by your health care provider. Make sure you discuss any questions you have with your health care   provider.  

## 2015-02-24 NOTE — Telephone Encounter (Signed)
Spoke with patient and she is aware of her follow up appointment per dr Loleta Dicker

## 2015-02-25 ENCOUNTER — Telehealth: Payer: Self-pay | Admitting: *Deleted

## 2015-02-25 NOTE — Telephone Encounter (Signed)
Patient called to report that she is having abd pain, bloating and cramping with the Levaquin. States that she is feeling better, sinus congestion improved and drainage pale in color. Patient advised to stop Levaquin and to notify us if symptoms worsen. She verbalized understanding.

## 2015-02-28 ENCOUNTER — Ambulatory Visit: Payer: Self-pay | Admitting: Surgery

## 2015-02-28 DIAGNOSIS — C50912 Malignant neoplasm of unspecified site of left female breast: Secondary | ICD-10-CM

## 2015-02-28 NOTE — H&P (Signed)
[name] [name] 02/28/2015 11:01 AM Location: [facility] Patient #: [id number] DOB: 1961-06-12 Married / Language: [name] / Race: White Female History of Present Illness ([name] A. [name] MD; 02/28/2015 12:46 PM) Patient words: breast f/u   Pt returns after chemotherapy to discuss surgery. he has had a comlete response to chemotherapy. She still desires blateral mastectomy and nipple preservation. She does not want to participate in Alliance Trial and only wants SLN mapping. She will keep her port.         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 Biopsy proven metastatic lymph node within the left axilla, also biopsied on 10/08/2014. LABS: Not applicable EXAM: BILATERAL BREAST MRI WITH AND WITHOUT CONTRAST TECHNIQUE: Multiplanar, multisequence MR images of both breasts were obtained prior to and following the intravenous administration of 9 ml of MultiHance. THREE-DIMENSIONAL MR IMAGE RENDERING ON INDEPENDENT WORKSTATION: Three-dimensional MR images were rendered by post-processing of the original MR data on an independent workstation. The three-dimensional MR images were interpreted, and findings are reported in the following complete MRI report for this study. Three dimensional images were evaluated at the independent DynaCad workstation COMPARISON: Previous breast MRI dated 10/22/2014. Comparison is also made to diagnostic mammogram and ultrasound dated 10/03/2014 and ultrasound-guided biopsy dated 10/08/2014. FINDINGS: Breast composition: c. Heterogeneous fibroglandular tissue. Background parenchymal enhancement: Mild Right breast: There is no suspicious mass or non-mass enhancement identified within the right breast. The presumed mass identified within the upper-outer quadrant of the right breast on the previous MRI is no longer visualized, suggesting it was merely background enhancement  related to normal fibroglandular tissues. Simple cyst is again identified within the outer right breast, 9 o'clock axis, as also seen on earlier right breast ultrasound. Left breast: There is now no suspicious mass or non-mass enhancement identified within the left breast. The biopsy-proven carcinoma (irregular enhancing mass) previously seen within the upper-outer quadrant is no longer visualized. Only the biopsy clip artifact is visualized. There is now no evidence of multifocal or multicentric disease identified within the left breast. Lymph nodes: There are now no enlarged or morphologically abnormal lymph nodes identified within either axillary or internal mammary chain region. Incidental note is made of a benign tortuous/ectatic blood vessel adjacent to the right internal mammary chain. Ancillary findings: None. IMPRESSION: No residual mass or abnormal enhancement within the LEFT breast consistent with excellent response to interval treatment. No enlarged lymph nodes are now identified within the LEFT axilla, again consistent with good response to interval treatment. No evidence of malignancy within the RIGHT breast. RECOMMENDATION: Per treatment plan. BI-RADS CATEGORY 2: Benign. Biopsy clip artifact within the upper-outer quadrant of the left breast. Patient does have known biopsy-proven left breast invasive carcinoma, and biopsy-proven left axillary lymphadenopathy, status post good response to interval chemotherapy with no residual abnormality identified on today's MRI. Electronically Signed By: Franki Cabot M.D. On: 02/17/2015 15:33     Vitals Height Weight BMI (Calculated) 5' (1.524 m) 48.444 kg (106 lb 12.8 oz) 20.9  Interpretation Summary CLINICAL DATA: Biopsy-proven left breast carcinoma on 10/08/2014, grade 3, ER/PR negative, her-2 positive. Biopsy proven metastatic lymph node within the left axilla, also biopsied on 10/08/2014. LABS: Not applicable EXAM:  BILATERAL BREAST MRI WITH AND WITHOUT CONTRAST TECHNIQUE: Multiplanar, multisequence MR images of both breasts were obtained prior to and following the intravenous administration of 9 ml of MultiHance. THREE-DIMENSIONAL MR IMAGE RENDERING ON INDEPENDENT WORKSTATION: Three-dimensional MR images were rendered by post-processing of the original  MR data on an independent workstation. The three-dimensional MR images were interpreted, and findings are reported in the following complete MRI report for this study. Three dimensional images were evaluated at the independent DynaCad workstation COMPARISON: Previous breast MRI dated 10/22/2014. Comparison is also made to diagnostic mammogram and ultrasound dated 10/03/2014 and ultrasound-guided biopsy dated 10/08/2014. FINDINGS: Breast composition: c. Heterogeneous fibroglandular tissue. Background parenchymal enhancement: Mild Right breast: There is no suspicious mass or non-mass enhancement identified within the right breast. The presumed mass identified within the upper-outer quadrant of the right breast on the previous MRI is no longer visualized, suggesting it was merely background enhancement related to normal fibroglandular tissues. Simple cyst is again identified within the outer right breast, 9 o'clock axis, as also seen on earlier right breast ultrasound. Left breast: There is now no suspicious mass or non-mass enhancement identified within the left breast. The biopsy-proven carcinoma (irregular enhancing mass) previously seen within the upper-outer quadrant is no longer visualized. Only the biopsy clip artifact is visualized. There is now no evidence of multifocal or multicentric disease identified within the left breast. Lymph nodes: There are now no enlarged or morphologically abnormal lymph nodes identified within either axillary or internal mammary chain region. Incidental note is made of a benign tortuous/ectatic blood vessel adjacent to the  right internal mammary chain. Ancillary findings: None. IMPRESSION: No residual mass or abnormal enhancement within the LEFT breast consistent with excellent response to interval treatment. No enlarged lymph nodes are now identified within the LEFT axilla, again consistent with good response to interval treatment. No evidence of malignancy within the RIGHT breast. RECOMMENDATION: Per treatment plan. BI-RADS CATEGORY 2: Benign. Biopsy clip artifact within the upper-outer quadrant of the left breast. Patient does have known biopsy-proven left breast invasive carcinoma, and biopsy-proven left axillary lymphadenopathy, status post good response to interval chemotherapy with no residual abnormality identified on today's MRI. Electronically Signed By: Franki Cabot M.D. On: 02/17/2015 15:33   External Result Report External Result Report <[email or web address]  Imaging Imaging Information <[email or web address]>.  The patient is a 54 year old female   Allergies Marjean Donna, CMA; 02/28/2015 11:02 AM) Butorphanol Tartrate *CHEMICALS* Nausea, Vomiting. Meperidine-Promethazine *ANALGESICS - OPIOID* Nausea, Vomiting. Sulfa 10 *OPHTHALMIC AGENTS* Rash.  Medication History Marjean Donna, CMA; 02/28/2015 11:02 AM) Advil (100MG  Tablet Chewable, Oral) Active. EMLA (2.5-2.5% Cream, External) Active. Ativan (0.5MG  Tablet, Oral) Active. Zofran (8MG  Tablet, Oral) Active. Paxil (20MG  Tablet, Oral) Active. Compazine (10MG  Tablet, Oral) Active. Calcium Carbonate (600MG  Tablet, Oral) Active. Tylenol (325MG  Tablet, Oral) Active. Goodys PM (38-500MG  Packet, Oral) Active. Tenoretic 50 (50-25MG  Tablet, Oral) Active. B Complex Vitamins (Oral) Active. Black Cohosh Root Active. Decadron (4MG  Tablet, Oral) Active. Relpax (40MG  Tablet, Oral) Active. Medications Reconciled    Vitals (Sonya Bynum CMA; 02/28/2015 11:01 AM) 02/28/2015 11:01 AM Weight: 110 lb Height:  60in Body Surface Area: 1.45 m Body Mass Index: 21.48 kg/m Temp.: 98.45F(Temporal)  Pulse: 69 (Regular)  BP: 122/78 (Sitting, Left Arm, Standard)     Physical Exam (Lorma Heater A. Alita Waldren MD; 02/28/2015 12:43 PM)  General Mental Status-Alert. General Appearance-Consistent with stated age. Hydration-Well hydrated. Voice-Normal.  Integumentary Note: alopecia   Head and Neck Head-normocephalic, atraumatic with no lesions or palpable masses. Trachea-midline. Thyroid Gland Characteristics - normal size and consistency.  Chest and Lung Exam Chest and lung exam reveals -quiet, even and easy respiratory effort with no use of accessory muscles and on auscultation, normal breath sounds, no adventitious sounds and normal vocal resonance. Inspection Chest Wall - Normal. Back -  normal.  Breast Breast - Left-Symmetric, Non Tender, No Biopsy scars, no Dimpling, No Inflammation, No Lumpectomy scars, No Mastectomy scars, No Peau d' Orange. Breast - Right-Symmetric, Non Tender, No Biopsy scars, no Dimpling, No Inflammation, No Lumpectomy scars, No Mastectomy scars, No Peau d' Orange. Breast Lump-No Palpable Breast Mass.  Cardiovascular Cardiovascular examination reveals -normal heart sounds, regular rate and rhythm with no murmurs and normal pedal pulses bilaterally.  Lymphatic Head & Neck  General Head & Neck Lymphatics: Bilateral - Description - Normal. Axillary  General Axillary Region: Bilateral - Description - Normal. Tenderness - Non Tender.    Assessment & Plan (Butch Otterson A. Corday Wyka MD; 02/28/2015 12:41 PM)  BREAST CANCER, STAGE 2, LEFT (174.9  C50.912) Impression: Pt desires bilateral nipple sparing mastectomy and left SLN mapping. She is not interested in the clinical trial after discussing for 30 minutes. She understands this is not standard of care and is ok with it. She is seeing Dr Harlow Mares soon. 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 The complication rate is doubled and she is aware. Discussed treatment options for breast cancer to include breast conservation vs mastectomy with reconstruction. Pt has decided on mastectomy. Risk include bleeding, infection, flap necrosis, pain, numbness, recurrence, hematoma, other surgery needs. Pt understands and agrees to proceed. Risk of sentinel lymph node mapping include bleeding, infection, lymphedema, shoulder pain. stiffness, dye allergy. cosmetic deformity , blood clots, death, need for more surgery. Pt agres to proceed.  Current Plans Pt Education - Patient information: Choosing treatment for early-stage breast cancer (The Basics): discussed with patient and provided information. Pt Education - Patient information: Sentinel lymph node biopsy for breast cancer (The Basics): discussed with patient and provided information. Pt Education - CCS Mastectomy HCI   The anatomy and the physiology was discussed. The pathophysiology and natural history of the disease was discussed. Options were discussed and recommendations were made. Technique, risks, benefits, & alternatives were discussed. Risks such as stroke, heart attack, bleeding, indection, death, and other risks discussed. Questions answered. The patient agrees to proceed. 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

## 2015-03-05 ENCOUNTER — Encounter: Payer: Self-pay | Admitting: Skilled Nursing Facility1

## 2015-03-05 ENCOUNTER — Other Ambulatory Visit: Payer: Self-pay

## 2015-03-05 DIAGNOSIS — C50412 Malignant neoplasm of upper-outer quadrant of left female breast: Secondary | ICD-10-CM

## 2015-03-05 NOTE — Progress Notes (Signed)
Subjective:     Patient ID: [name] [name], female   DOB: 02-14-1961, 54 y.o.   MRN: [id number]  HPI   Review of Systems     Objective:   Physical Exam To assist the pt in identifying dietary strategies to gain some lost wt back.    Assessment:     Pt identified as being malnourished due to some lost wt. Pt contacted via the telephone at [phone]. Pt was unavailable.    Plan:     Dietitian left a voicemail prompting the pt to contact Ernestene Kiel CSO,RD,LDN at [phone].

## 2015-03-06 ENCOUNTER — Other Ambulatory Visit (HOSPITAL_BASED_OUTPATIENT_CLINIC_OR_DEPARTMENT_OTHER): Payer: PRIVATE HEALTH INSURANCE

## 2015-03-06 ENCOUNTER — Ambulatory Visit (HOSPITAL_BASED_OUTPATIENT_CLINIC_OR_DEPARTMENT_OTHER): Payer: PRIVATE HEALTH INSURANCE | Admitting: Hematology and Oncology

## 2015-03-06 ENCOUNTER — Telehealth: Payer: Self-pay | Admitting: Hematology and Oncology

## 2015-03-06 ENCOUNTER — Encounter: Payer: Self-pay | Admitting: Hematology and Oncology

## 2015-03-06 VITALS — BP 101/60 | HR 85 | Temp 98.1°F | Resp 18 | Ht 60.0 in | Wt 112.9 lb

## 2015-03-06 DIAGNOSIS — C773 Secondary and unspecified malignant neoplasm of axilla and upper limb lymph nodes: Secondary | ICD-10-CM | POA: Diagnosis not present

## 2015-03-06 DIAGNOSIS — R110 Nausea: Secondary | ICD-10-CM

## 2015-03-06 DIAGNOSIS — G43919 Migraine, unspecified, intractable, without status migrainosus: Secondary | ICD-10-CM

## 2015-03-06 DIAGNOSIS — Z171 Estrogen receptor negative status [ER-]: Secondary | ICD-10-CM

## 2015-03-06 DIAGNOSIS — E860 Dehydration: Secondary | ICD-10-CM

## 2015-03-06 DIAGNOSIS — D6481 Anemia due to antineoplastic chemotherapy: Secondary | ICD-10-CM | POA: Diagnosis not present

## 2015-03-06 DIAGNOSIS — T451X5A Adverse effect of antineoplastic and immunosuppressive drugs, initial encounter: Secondary | ICD-10-CM

## 2015-03-06 DIAGNOSIS — C50412 Malignant neoplasm of upper-outer quadrant of left female breast: Secondary | ICD-10-CM | POA: Diagnosis not present

## 2015-03-06 DIAGNOSIS — G43909 Migraine, unspecified, not intractable, without status migrainosus: Secondary | ICD-10-CM

## 2015-03-06 LAB — CBC WITH DIFFERENTIAL/PLATELET
BASO%: 0.3 % (ref 0.0–2.0)
BASOS ABS: 0 10*3/uL (ref 0.0–0.1)
EOS%: 0 % (ref 0.0–7.0)
Eosinophils Absolute: 0 10*3/uL (ref 0.0–0.5)
HEMATOCRIT: 28.9 % — AB (ref 34.8–46.6)
HGB: 9.8 g/dL — ABNORMAL LOW (ref 11.6–15.9)
LYMPH#: 1.2 10*3/uL (ref 0.9–3.3)
LYMPH%: 11.8 % — AB (ref 14.0–49.7)
MCH: 33.4 pg (ref 25.1–34.0)
MCHC: 34.1 g/dL (ref 31.5–36.0)
MCV: 98.1 fL (ref 79.5–101.0)
MONO#: 0.3 10*3/uL (ref 0.1–0.9)
MONO%: 2.7 % (ref 0.0–14.0)
NEUT#: 8.5 10*3/uL — ABNORMAL HIGH (ref 1.5–6.5)
NEUT%: 85.2 % — AB (ref 38.4–76.8)
PLATELETS: 212 10*3/uL (ref 145–400)
RBC: 2.94 10*6/uL — ABNORMAL LOW (ref 3.70–5.45)
RDW: 16.4 % — ABNORMAL HIGH (ref 11.2–14.5)
WBC: 10 10*3/uL (ref 3.9–10.3)

## 2015-03-06 LAB — COMPREHENSIVE METABOLIC PANEL (CC13)
ALT: 34 U/L (ref 0–55)
ANION GAP: 12 meq/L — AB (ref 3–11)
AST: 26 U/L (ref 5–34)
Albumin: 3.9 g/dL (ref 3.5–5.0)
Alkaline Phosphatase: 93 U/L (ref 40–150)
BUN: 10.4 mg/dL (ref 7.0–26.0)
CALCIUM: 9.2 mg/dL (ref 8.4–10.4)
CHLORIDE: 106 meq/L (ref 98–109)
CO2: 25 mEq/L (ref 22–29)
Creatinine: 0.7 mg/dL (ref 0.6–1.1)
EGFR: >90 mL/min/{1.73_m2} (ref >90)
Glucose: 128 mg/dl (ref 70–140)
POTASSIUM: 3.9 meq/L (ref 3.5–5.1)
Sodium: 143 mEq/L (ref 136–145)
Total Bilirubin: 0.35 mg/dL (ref 0.20–1.20)
Total Protein: 6.4 g/dL (ref 6.4–8.3)

## 2015-03-06 NOTE — Progress Notes (Signed)
Patient Care Team: Laray Anger, MD as PCP - General (Obstetrics and Gynecology) Erroll Luna, MD as Consulting Physician (General Surgery) Nicholas Lose, MD as Consulting Physician (Hematology and Oncology) Gery Pray, MD as Consulting Physician (Radiation Oncology) Mauro Kaufmann, RN as Registered Nurse Rockwell Germany, RN as Registered Nurse  DIAGNOSIS: Breast cancer of upper-outer quadrant of left female breast   Staging form: Breast, AJCC 7th Edition     Clinical stage from 10/16/2014: Stage IIA (T1c, N1, M0) - Unsigned   SUMMARY OF ONCOLOGIC HISTORY:   Breast cancer of upper-outer quadrant of left female breast   10/01/2014 Mammogram Left breast mammogram and ultrasound for palpable left breast mass which was a maxillary mass with an ill-defined abnormality by ultrasound 1.7 cm at 2:00   10/11/2014 Initial Diagnosis Left breast invasive ductal carcinoma, grade 3, ER/PR negative, 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 5.32, axillary lymph node also positive for cancer   10/30/2014 Imaging CT chest abdomen pelvis and bone scan revealed no evidence of distant metastatic disease but enlarged left axillary and left subpectoral lymph nodes   11/01/2014 -  Neo-Adjuvant Chemotherapy Neoadjuvant TCH Perjeta to 3 weeks 6 cycles followed by Herceptin maintenance    CHIEF COMPLIANT: Herceptin maintenance  INTERVAL HISTORY: [name] [name] is a 54 year old with above-mentioned history of left breast cancer currently on maintenance Herceptin. She is planning to undergo bilateral mastectomies and will discuss with Dr. Brantley Stage. She denies any major problems from chemotherapy. Her energy levels are improving slowly.no longer has nausea and vomiting.  REVIEW OF SYSTEMS:   Constitutional: Denies fevers, chills or abnormal weight loss Eyes: Denies blurriness of vision Ears, nose, mouth, throat, and face: Denies mucositis or sore throat Respiratory: Denies cough, dyspnea or wheezes Cardiovascular: Denies palpitation, chest  discomfort or lower extremity swelling Gastrointestinal:  Denies nausea, heartburn or change in bowel habits Skin: Denies abnormal skin rashes Lymphatics: Denies new lymphadenopathy or easy bruising Neurological:Denies numbness, tingling or new weaknesses Behavioral/Psych: Mood is stable, no new changes  Breast:  denies any pain or lumps or nodules in either breasts All other systems were reviewed with the patient and are negative.  I have reviewed the past medical history, past surgical history, social history and family history with the patient and they are unchanged from previous note.  ALLERGIES:  is allergic to butorphanol; meperidine; and sulfa antibiotics.  MEDICATIONS:  Current Outpatient Prescriptions  Medication Sig Dispense Refill  . acetaminophen (TYLENOL) 325 MG tablet Take 650 mg by mouth every 6 (six) hours as needed.    . Aspirin-Acetaminophen-Caffeine (GOODYS EXTRA STRENGTH PO) Take by mouth.    Marland Kitchen atenolol-chlorthalidone (TENORETIC) 50-25 MG per tablet Take by mouth. Pt takes 75-25 mg of this medication    . B Complex-Biotin-FA (B-COMPLEX PO) Take by mouth.    Marland Kitchen BLACK COHOSH PO Take by mouth.    . Calcium Carbonate (CALCIUM 600 PO) Take by mouth.    . dexamethasone (DECADRON) 2 MG tablet Take 2 mg by mouth. Takes daily for migraines until prescribed 4 mg regimen starts    . dexamethasone (DECADRON) 4 MG tablet Take 1 tablet (4 mg total) by mouth 2 (two) times daily. Start the day before Taxotere. Then again the day after chemo for 3 days. 30 tablet 1  . Diclofenac Potassium (CAMBIA PO) Take by mouth daily as needed.    . diphenoxylate-atropine (LOMOTIL) 2.5-0.025 MG per tablet Take 1 tablet by mouth 4 (four) times daily as needed for diarrhea or loose stools. 30 tablet 0  .  eletriptan (RELPAX) 40 MG tablet Take 1 tablet (40 mg total) by mouth every 2 (two) hours as needed for migraine or headache (Max 2 per day). 10 tablet 3  . EVENING PRIMROSE OIL PO Take 1,300 mg by  mouth.    . hydrocortisone 2.5 % ointment Apply topically 2 (two) times daily. 30 g 0  . ibuprofen (ADVIL,MOTRIN) 100 MG tablet Take 100 mg by mouth every 6 (six) hours as needed for fever.    . Lactobacillus (ACIDOPHILUS PO) Take by mouth.    . levofloxacin (LEVAQUIN) 500 MG tablet Take 1 tablet (500 mg total) by mouth daily. 7 tablet 0  . lidocaine-prilocaine (EMLA) cream Apply to affected area once 30 g 3  . LORazepam (ATIVAN) 0.5 MG tablet Take 1 tablet (0.5 mg total) by mouth every 6 (six) hours as needed (Nausea or vomiting). 45 tablet 0  . oxyCODONE-acetaminophen (ROXICET) 5-325 MG per tablet Take 1 tablet by mouth every 4 (four) hours as needed. 30 tablet 0  . PARoxetine (PAXIL) 20 MG tablet Take 20 mg by mouth.    . prochlorperazine (COMPAZINE) 10 MG tablet Take 1 tablet (10 mg total) by mouth every 6 (six) hours as needed (Nausea or vomiting). 30 tablet 1  . promethazine (PHENERGAN) 12.5 MG tablet Take 1 tablet (12.5 mg total) by mouth every 6 (six) hours as needed for nausea or vomiting. 90 tablet 3  . solifenacin (VESICARE) 5 MG tablet Take 10 mg by mouth.    Marland Kitchen VITAMIN D, CHOLECALCIFEROL, PO Take by mouth.     No current facility-administered medications for this visit.    PHYSICAL EXAMINATION: ECOG PERFORMANCE STATUS: 1 - Symptomatic but completely ambulatory  Filed Vitals:   03/06/15 1112  BP: 101/60  Pulse: 85  Temp: 98.1 F (36.7 C)  Resp: 18   Filed Weights   03/06/15 1112  Weight: 112 lb 14.4 oz (51.211 kg)    GENERAL:alert, no distress and comfortable SKIN: skin color, texture, turgor are normal, no rashes or significant lesions EYES: normal, Conjunctiva are pink and non-injected, sclera clear OROPHARYNX:no exudate, no erythema and lips, buccal mucosa, and tongue normal  NECK: supple, thyroid normal size, non-tender, without nodularity LYMPH:  no palpable lymphadenopathy in the cervical, axillary or inguinal LUNGS: clear to auscultation and percussion with  normal breathing effort HEART: regular rate & rhythm and no murmurs and no lower extremity edema ABDOMEN:abdomen soft, non-tender and normal bowel sounds Musculoskeletal:no cyanosis of digits and no clubbing  NEURO: alert & oriented x 3 with fluent speech, no focal motor/sensory deficits LABORATORY DATA:  I have reviewed the data as listed   Chemistry      Component Value Date/Time   NA 142 02/14/2015 0847   K 4.0 02/14/2015 0847   CO2 27 02/14/2015 0847   BUN 12.3 02/14/2015 0847   CREATININE 0.6 02/14/2015 0847      Component Value Date/Time   CALCIUM 10.0 02/14/2015 0847   ALKPHOS 84 02/14/2015 0847   AST 23 02/14/2015 0847   ALT 34 02/14/2015 0847   BILITOT 0.29 02/14/2015 0847       Lab Results  Component Value Date   WBC 10.0 03/06/2015   HGB 9.8* 03/06/2015   HCT 28.9* 03/06/2015   MCV 98.1 03/06/2015   PLT 212 03/06/2015   NEUTROABS 8.5* 03/06/2015   ASSESSMENT & PLAN:  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 Patient wants to do bilateral mastectomies. She is not a candidate for [facility] clinical trial. 2. continue maintenance Herceptin every 3 weeks  Upper respiratory infection: Resolved with antibiotics Chemotherapy-induced anemia Migraine disorder: Tapering steroids  Return to clinic in 6 weeks for follow-up  Patient meeting Dr.Bowers tomorrow. Plan  2nd or 3 rd week of OCtober  No orders of the defined types were placed in this encounter.   The patient has a good understanding of the overall plan. she agrees with it. she will call with any problems that may develop before the next visit here.   Rulon Eisenmenger, MD

## 2015-03-06 NOTE — Assessment & Plan Note (Signed)
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 Breast conserving surgery 2. To do maintenance Herceptin every 3 weeks  Upper respiratory infection: Resolved with antibiotics  Return to clinic in 6 weeks for follow-up 1.

## 2015-03-06 NOTE — Progress Notes (Signed)
Patient Care Team: Laray Anger, MD as PCP - General (Obstetrics and Gynecology) Erroll Luna, MD as Consulting Physician (General Surgery) Nicholas Lose, MD as Consulting Physician (Hematology and Oncology) Gery Pray, MD as Consulting Physician (Radiation Oncology) Mauro Kaufmann, RN as Registered Nurse Rockwell Germany, RN as Registered Nurse  DIAGNOSIS: Breast cancer of upper-outer quadrant of left female breast   Staging form: Breast, AJCC 7th Edition     Clinical stage from 10/16/2014: Stage IIA (T1c, N1, M0) - Unsigned   SUMMARY OF ONCOLOGIC HISTORY:   Breast cancer of upper-outer quadrant of left female breast   10/01/2014 Mammogram Left breast mammogram and ultrasound for palpable left breast mass which was a maxillary mass with an ill-defined abnormality by ultrasound 1.7 cm at 2:00   10/11/2014 Initial Diagnosis Left breast invasive ductal carcinoma, grade 3, ER/PR negative, 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 5.32, axillary lymph node also positive for cancer   10/30/2014 Imaging CT chest abdomen pelvis and bone scan revealed no evidence of distant metastatic disease but enlarged left axillary and left subpectoral lymph nodes   11/01/2014 -  Neo-Adjuvant Chemotherapy Neoadjuvant TCH Perjeta to 3 weeks 6 cycles followed by Herceptin maintenance    CHIEF COMPLIANT: Herceptin maintenance  INTERVAL HISTORY: [name] [name] is a 54 year old with above-mentioned history of left breast cancer who has received neo-adjuvant chemotherapy and is awaiting surgery. She is today here to receive Herceptin maintenance. She is tapering the dexamethasone which she takes for migraines. She denies any nausea or vomiting. She is gaining weight and her energy levels are improving. Denies any neuropathy.  REVIEW OF SYSTEMS:   Constitutional: Denies fevers, chills or abnormal weight loss Eyes: Denies blurriness of vision Ears, nose, mouth, throat, and face: Denies mucositis or sore throat Respiratory: Denies cough,  dyspnea or wheezes Cardiovascular: Denies palpitation, chest discomfort or lower extremity swelling Gastrointestinal:  Denies nausea, heartburn or change in bowel habits Skin: Denies abnormal skin rashes Lymphatics: Denies new lymphadenopathy or easy bruising Neurological:Denies numbness, tingling or new weaknesses Behavioral/Psych: Mood is stable, no new changes , major problems with migraines previously Breast:  denies any pain or lumps or nodules in either breasts All other systems were reviewed with the patient and are negative.  I have reviewed the past medical history, past surgical history, social history and family history with the patient and they are unchanged from previous note.  ALLERGIES:  is allergic to butorphanol; meperidine; and sulfa antibiotics.  MEDICATIONS:  Current Outpatient Prescriptions  Medication Sig Dispense Refill  . acetaminophen (TYLENOL) 325 MG tablet Take 650 mg by mouth every 6 (six) hours as needed.    . Aspirin-Acetaminophen-Caffeine (GOODYS EXTRA STRENGTH PO) Take by mouth.    Marland Kitchen atenolol-chlorthalidone (TENORETIC) 50-25 MG per tablet Take by mouth. Pt takes 75-25 mg of this medication    . B Complex-Biotin-FA (B-COMPLEX PO) Take by mouth.    Marland Kitchen BLACK COHOSH PO Take by mouth.    . Calcium Carbonate (CALCIUM 600 PO) Take by mouth.    . dexamethasone (DECADRON) 2 MG tablet Take 2 mg by mouth. Takes daily for migraines until prescribed 4 mg regimen starts    . dexamethasone (DECADRON) 4 MG tablet Take 1 tablet (4 mg total) by mouth 2 (two) times daily. Start the day before Taxotere. Then again the day after chemo for 3 days. 30 tablet 1  . Diclofenac Potassium (CAMBIA PO) Take by mouth daily as needed.    . diphenoxylate-atropine (LOMOTIL) 2.5-0.025 MG per tablet Take 1 tablet  by mouth 4 (four) times daily as needed for diarrhea or loose stools. 30 tablet 0  . eletriptan (RELPAX) 40 MG tablet Take 1 tablet (40 mg total) by mouth every 2 (two) hours as needed  for migraine or headache (Max 2 per day). 10 tablet 3  . EVENING PRIMROSE OIL PO Take 1,300 mg by mouth.    . hydrocortisone 2.5 % ointment Apply topically 2 (two) times daily. 30 g 0  . ibuprofen (ADVIL,MOTRIN) 100 MG tablet Take 100 mg by mouth every 6 (six) hours as needed for fever.    . Lactobacillus (ACIDOPHILUS PO) Take by mouth.    . levofloxacin (LEVAQUIN) 500 MG tablet Take 1 tablet (500 mg total) by mouth daily. 7 tablet 0  . lidocaine-prilocaine (EMLA) cream Apply to affected area once 30 g 3  . LORazepam (ATIVAN) 0.5 MG tablet Take 1 tablet (0.5 mg total) by mouth every 6 (six) hours as needed (Nausea or vomiting). 45 tablet 0  . oxyCODONE-acetaminophen (ROXICET) 5-325 MG per tablet Take 1 tablet by mouth every 4 (four) hours as needed. 30 tablet 0  . PARoxetine (PAXIL) 20 MG tablet Take 20 mg by mouth.    . prochlorperazine (COMPAZINE) 10 MG tablet Take 1 tablet (10 mg total) by mouth every 6 (six) hours as needed (Nausea or vomiting). 30 tablet 1  . promethazine (PHENERGAN) 12.5 MG tablet Take 1 tablet (12.5 mg total) by mouth every 6 (six) hours as needed for nausea or vomiting. 90 tablet 3  . solifenacin (VESICARE) 5 MG tablet Take 10 mg by mouth.    Marland Kitchen VITAMIN D, CHOLECALCIFEROL, PO Take by mouth.     No current facility-administered medications for this visit.    PHYSICAL EXAMINATION: ECOG PERFORMANCE STATUS: 1 - Symptomatic but completely ambulatory  Filed Vitals:   03/06/15 1112  BP: 101/60  Pulse: 85  Temp: 98.1 F (36.7 C)  Resp: 18   Filed Weights   03/06/15 1112  Weight: 112 lb 14.4 oz (51.211 kg)    GENERAL:alert, no distress and comfortable SKIN: skin color, texture, turgor are normal, no rashes or significant lesions EYES: normal, Conjunctiva are pink and non-injected, sclera clear OROPHARYNX:no exudate, no erythema and lips, buccal mucosa, and tongue normal  NECK: supple, thyroid normal size, non-tender, without nodularity LYMPH:  no palpable  lymphadenopathy in the cervical, axillary or inguinal LUNGS: clear to auscultation and percussion with normal breathing effort HEART: regular rate & rhythm and no murmurs and no lower extremity edema ABDOMEN:abdomen soft, non-tender and normal bowel sounds Musculoskeletal:no cyanosis of digits and no clubbing  NEURO: alert & oriented x 3 with fluent speech, no focal motor/sensory deficits  LABORATORY DATA:  I have reviewed the data as listed   Chemistry      Component Value Date/Time   NA 142 02/14/2015 0847   K 4.0 02/14/2015 0847   CO2 27 02/14/2015 0847   BUN 12.3 02/14/2015 0847   CREATININE 0.6 02/14/2015 0847      Component Value Date/Time   CALCIUM 10.0 02/14/2015 0847   ALKPHOS 84 02/14/2015 0847   AST 23 02/14/2015 0847   ALT 34 02/14/2015 0847   BILITOT 0.29 02/14/2015 0847       Lab Results  Component Value Date   WBC 10.0 03/06/2015   HGB 9.8* 03/06/2015   HCT 28.9* 03/06/2015   MCV 98.1 03/06/2015   PLT 212 03/06/2015   NEUTROABS 8.5* 03/06/2015    ASSESSMENT & PLAN:  Breast cancer of upper-outer  quadrant of left female breast 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 Patient had long discussions and she decided that she wants to do bilateral mastectomies with immediate reconstruction. She will discuss this with Dr. Brantley Stage as well as with plastic surgery. 2. continue maintenance Herceptin every 3 weeks until end  of April 2017  Anemia related to chemotherapy: Being watched and monitored hemoglobin 9.8 today. Severe migraines: She is tapering her dexamethasone and will discontinue it next week. Upper respiratory infection: Resolved with antibiotics  Return to clinic in 6 weeks for follow-up  No orders of the defined types were placed in this encounter.   The patient has a good understanding of the overall plan. she agrees with it. she will call with any problems that may develop before the next visit here.   Rulon Eisenmenger, MD

## 2015-03-06 NOTE — Telephone Encounter (Signed)
Gave avs & calendar for September/October. °

## 2015-03-07 ENCOUNTER — Telehealth: Payer: Self-pay | Admitting: *Deleted

## 2015-03-07 ENCOUNTER — Ambulatory Visit: Payer: PRIVATE HEALTH INSURANCE

## 2015-03-07 ENCOUNTER — Other Ambulatory Visit: Payer: Self-pay | Admitting: *Deleted

## 2015-03-07 ENCOUNTER — Ambulatory Visit (HOSPITAL_BASED_OUTPATIENT_CLINIC_OR_DEPARTMENT_OTHER): Payer: PRIVATE HEALTH INSURANCE

## 2015-03-07 VITALS — BP 112/64 | HR 71 | Temp 98.0°F | Resp 20

## 2015-03-07 DIAGNOSIS — C50412 Malignant neoplasm of upper-outer quadrant of left female breast: Secondary | ICD-10-CM | POA: Diagnosis not present

## 2015-03-07 DIAGNOSIS — C773 Secondary and unspecified malignant neoplasm of axilla and upper limb lymph nodes: Secondary | ICD-10-CM

## 2015-03-07 DIAGNOSIS — D6481 Anemia due to antineoplastic chemotherapy: Secondary | ICD-10-CM

## 2015-03-07 DIAGNOSIS — T451X5A Adverse effect of antineoplastic and immunosuppressive drugs, initial encounter: Principal | ICD-10-CM

## 2015-03-07 DIAGNOSIS — Z5112 Encounter for antineoplastic immunotherapy: Secondary | ICD-10-CM

## 2015-03-07 MED ORDER — TRASTUZUMAB CHEMO INJECTION 440 MG
6.0000 mg/kg | Freq: Once | INTRAVENOUS | Status: AC
  Administered 2015-03-07: 294 mg via INTRAVENOUS
  Filled 2015-03-07: qty 14

## 2015-03-07 MED ORDER — HEPARIN SOD (PORK) LOCK FLUSH 100 UNIT/ML IV SOLN
500.0000 [IU] | Freq: Once | INTRAVENOUS | Status: AC | PRN
  Administered 2015-03-07: 500 [IU]
  Filled 2015-03-07: qty 5

## 2015-03-07 MED ORDER — SODIUM CHLORIDE 0.9 % IV SOLN
Freq: Once | INTRAVENOUS | Status: AC
  Administered 2015-03-07: 13:00:00 via INTRAVENOUS

## 2015-03-07 MED ORDER — DIPHENHYDRAMINE HCL 25 MG PO CAPS: 50.0000 mg | ORAL_CAPSULE | Freq: Once | ORAL | Status: DC

## 2015-03-07 MED ORDER — SODIUM CHLORIDE 0.9 % IJ SOLN
10.0000 mL | INTRAMUSCULAR | Status: DC | PRN
  Administered 2015-03-07: 10 mL
  Filled 2015-03-07: qty 10

## 2015-03-07 MED ORDER — ACETAMINOPHEN 325 MG PO TABS: 650.0000 mg | ORAL_TABLET | Freq: Once | ORAL | Status: DC

## 2015-03-07 NOTE — Patient Instructions (Signed)
[facility] Discharge Instructions for Patients Receiving Chemotherapy  Today you received the following chemotherapy agents Herceptin  To help prevent nausea and vomiting after your treatment, we encourage you to take your nausea medication    If you develop nausea and vomiting that is not controlled by your nausea medication, call the clinic.   BELOW ARE SYMPTOMS THAT SHOULD BE REPORTED IMMEDIATELY:  *FEVER GREATER THAN 100.5 F  *CHILLS WITH OR WITHOUT FEVER  NAUSEA AND VOMITING THAT IS NOT CONTROLLED WITH YOUR NAUSEA MEDICATION  *UNUSUAL SHORTNESS OF BREATH  *UNUSUAL BRUISING OR BLEEDING  TENDERNESS IN MOUTH AND THROAT WITH OR WITHOUT PRESENCE OF ULCERS  *URINARY PROBLEMS  *BOWEL PROBLEMS  UNUSUAL RASH Items with * indicate a potential emergency and should be followed up as soon as possible.  Feel free to call the clinic you have any questions or concerns. The clinic phone number is [phone].  Please show the CHEMO ALERT CARD at check-in to the Emergency Department and triage nurse.   

## 2015-03-07 NOTE — Telephone Encounter (Signed)
Pt called stating that she just left Dr. Alvira Philips office and she needs to be seen by Dr. Sondra Come asap - next week.  Called and spoke w/ Suanne Marker and obtained an appt for 03/11/15 at 8:30/9am appt.  Called pt back and confirmed 03/11/15 appt w/ her.  Emailed Varney Biles and Dawn to make them aware.

## 2015-03-10 NOTE — Progress Notes (Signed)
Location of Breast Cancer: Breast cancer of upper-outer quadrant of left female breast  Histology per Pathology Report:   10/08/14   Receptor Status: ER(0%), PR (0%), Her2-neu (positive)  Did patient present with symptoms (if so, please note symptoms) or was this found on screening mammography?: Earlier this year the patient palpated a mass within the left axillary region followed by a palpable mass in the upper outer quadrant of the left breast.  Past/Anticipated interventions by surgeon, if any: She is planning to undergo bilateral mastectomies and will discuss with Dr. Brantley Stage  Past/Anticipated interventions by medical oncology, if any: Neo adjuvant chemotherapy with [facility] for general every 3 weeks 6 cycles started 11/01/2014 completed 02/14/2015  Lymphedema issues, if any: no   Pain issues, if any:  yes has pain due to a hemorrhoid caused by diarrhea from chemo.  She takes lomotil as needed.  OB Gyn history:  Patient was 11 years with first menses, was 84 years with birth of first child, she has 3 children, she has not used bcp or hormone therapy.  SAFETY ISSUES:  Prior radiation? no  Pacemaker/ICD? no  Possible current pregnancy?no  Is the patient on methotrexate? no  Current Complaints / other details:  Patient is here with her husband.  BP 101/64 mmHg  Pulse 79  Temp(Src) 97.9 F (36.6 C) (Oral)  Resp 16  Ht 5' (1.524 m)  Wt 110 lb 4.8 oz (50.032 kg)  BMI 21.54 kg/m2

## 2015-03-11 ENCOUNTER — Encounter: Payer: Self-pay | Admitting: Radiation Oncology

## 2015-03-11 ENCOUNTER — Ambulatory Visit
Admission: RE | Admit: 2015-03-11 | Discharge: 2015-03-11 | Disposition: A | Discharge status: Home / Self Care | Payer: PRIVATE HEALTH INSURANCE | Source: Ambulatory Visit | Attending: Radiation Oncology | Admitting: Radiation Oncology

## 2015-03-11 VITALS — BP 101/64 | HR 79 | Temp 97.9°F | Resp 16 | Ht 60.0 in | Wt 110.3 lb

## 2015-03-11 DIAGNOSIS — C50412 Malignant neoplasm of upper-outer quadrant of left female breast: Secondary | ICD-10-CM | POA: Diagnosis present

## 2015-03-11 DIAGNOSIS — Z171 Estrogen receptor negative status [ER-]: Secondary | ICD-10-CM | POA: Diagnosis not present

## 2015-03-11 DIAGNOSIS — Z510 Encounter for antineoplastic radiation therapy: Secondary | ICD-10-CM | POA: Insufficient documentation

## 2015-03-11 NOTE — Progress Notes (Signed)
Radiation Oncology         [phone] ________________________________  Name: JEREMY MCLAMB MRN: [id number]  Date: 03/11/2015  DOB: 18-Jun-1961  Re-Evaluation Note  CC: Laray Anger, MD  Crissie Reese, MD    ICD-9-CM ICD-10-CM   1. Breast cancer of upper-outer quadrant of left female breast 174.4 C50.412     Diagnosis:   Clinical stage  Stage IIA (T1c, N1, M0) Left breast invasive ductal carcinoma, grade 3, ER/PR negative, 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 5.32, axillary lymph node also positive for cancer, status post  Neoadjuvant TCH Perjeta every 3 weeks 6 cycles followed by Herceptin;     Narrative:  The patient returns today for further evaluation.  She was initially seen in the multidisciplinary breast clinic on 10/16/2014. Patient was found to have a high-grade HER-2/neu positive tumor. She proceeded with neoadjuvant chemotherapy as documented above with an excellent clinical and radiographic response. Patient has decided to proceed with bilateral mastectomies. She is also considering immediate reconstruction and was seen by Dr. Crissie Reese.  Dr. Harlow Mares was concerned about potential recommendations for postmastectomy irradiation and the patient is now seen in radiation oncology for further evaluation. Patient understands that she would be a candidate for breast conserving therapy given her excellent response to treatment but her desire is to proceed with bilateral mastectomies.   She denies any pain within the breast or axillary region. She denies any new bony pain headaches dizziness or blurred vision.                          ALLERGIES:  is allergic to butorphanol; meperidine; and sulfa antibiotics.  Meds: Current Outpatient Prescriptionsll  Medication Sig Dispense Refill  . acetaminophen (TYLENOL) 325 MG tablet Take 650 mg by mouth every 6 (six) hours as needed.    . Aspirin-Acetaminophen-Caffeine (GOODYS EXTRA STRENGTH PO) Take by mouth.    Marland Kitchen atenolol (TENORMIN) 25 MG tablet Take 75  mg by mouth daily. Takes 3 25 mg tablets daily    . B Complex-Biotin-FA (B-COMPLEX PO) Take by mouth.    . Calcium Carbonate (CALCIUM 600 PO) Take by mouth.    . dexamethasone (DECADRON) 2 MG tablet Take 2 mg by mouth. Takes daily for migraines until prescribed 4 mg regimen starts    . Diclofenac Potassium (CAMBIA PO) Take by mouth daily as needed.    . diphenoxylate-atropine (LOMOTIL) 2.5-0.025 MG per tablet Take 1 tablet by mouth 4 (four) times daily as needed for diarrhea or loose stools. 30 tablet 0  . eletriptan (RELPAX) 40 MG tablet Take 1 tablet (40 mg total) by mouth every 2 (two) hours as needed for migraine or headache (Max 2 per day). 10 tablet 3  . hydrocortisone 2.5 % ointment Apply topically 2 (two) times daily. 30 g 0  . ibuprofen (ADVIL,MOTRIN) 100 MG tablet Take 100 mg by mouth every 6 (six) hours as needed for fever.    . Lactobacillus (ACIDOPHILUS PO) Take by mouth.    . lidocaine-prilocaine (EMLA) cream Apply to affected area once 30 g 3  . LORazepam (ATIVAN) 0.5 MG tablet Take 1 tablet (0.5 mg total) by mouth every 6 (six) hours as needed (Nausea or vomiting). 45 tablet 0  . PARoxetine (PAXIL) 20 MG tablet Take 20 mg by mouth.    . promethazine (PHENERGAN) 12.5 MG tablet Take 1 tablet (12.5 mg total) by mouth every 6 (six) hours as needed for nausea or vomiting. 90 tablet 3  .  VITAMIN D, CHOLECALCIFEROL, PO Take by mouth.    Marland Kitchen atenolol-chlorthalidone (TENORETIC) 50-25 MG per tablet Take by mouth. Pt takes 75-25 mg of this medication    . BLACK COHOSH PO Take by mouth.    . dexamethasone (DECADRON) 4 MG tablet Take 1 tablet (4 mg total) by mouth 2 (two) times daily. Start the day before Taxotere. Then again the day after chemo for 3 days. (Patient not taking: Reported on 03/11/2015) 30 tablet 1  . EVENING PRIMROSE OIL PO Take 1,300 mg by mouth.    . oxyCODONE-acetaminophen (ROXICET) 5-325 MG per tablet Take 1 tablet by mouth every 4 (four) hours as needed. (Patient not taking:  Reported on 03/11/2015) 30 tablet 0  . prochlorperazine (COMPAZINE) 10 MG tablet Take 1 tablet (10 mg total) by mouth every 6 (six) hours as needed (Nausea or vomiting). (Patient not taking: Reported on 03/11/2015) 30 tablet 1  . solifenacin (VESICARE) 5 MG tablet Take 10 mg by mouth.     No current facility-administered medications for this encounter.    Physical Findings: The patient is in no acute distress. Patient is alert and oriented.  Accompanied by husband on evaluation today  height is 5' (1.524 m) and weight is 110 lb 4.8 oz (50.032 kg). Her oral temperature is 97.9 F (36.6 C). Her blood pressure is 101/64 and her pulse is 79. Her respiration is 16. Marland Kitchen  No palpable supraclavicular or axillary adenopathy. The lungs are clear to auscultation.  the heart has a regular rhythm and rate. The mass in the upper outer quadrant of the left breast is no longer palpable.  Lab Findings: Lab Results  Component Value Date   WBC 10.0 03/06/2015   HGB 9.8* 03/06/2015   HCT 28.9* 03/06/2015   MCV 98.1 03/06/2015   PLT 212 03/06/2015    Radiographic Findings: Ct Chest W Contrast  02/18/2015   CLINICAL DATA:  Restaging left-sided breast cancer diagnosed 4 months ago. Chemotherapy administered earlier this month. Subsequent encounter.  EXAM: CT CHEST WITH CONTRAST  TECHNIQUE: Multidetector CT imaging of the chest was performed during intravenous contrast administration.  CONTRAST:  74m OMNIPAQUE IOHEXOL 300 MG/ML  SOLN  COMPARISON:  Bone scan and chest CT 10/30/2014.  FINDINGS: Mediastinum/Nodes: No enlarged supraclavicular, internal mammary, mediastinal or hilar lymph nodes. The previously demonstrated left axillary and left subpectoral adenopathy has resolved. The thyroid gland, trachea and esophagus demonstrate no significant findings. The heart size is normal. There is no pericardial effusion. Right IJ Port-A-Cath tip at the SVC right atrial junction.  Lungs/Pleura: There is no pleural effusion. The  lungs remain clear.  Upper abdomen: Stable without suspicious findings. There are small cysts within the right hepatic lobe which appear unchanged.  Musculoskeletal/Chest wall: Biopsy clips are present laterally in each breast. There is no residual left breast mass. No worrisome osseous findings.  IMPRESSION: 1. Interval resolution of left breast mass and multiple enlarged left axillary and subpectoral lymph nodes. 2. No new metastases or acute findings demonstrated.   Electronically Signed   By: WRichardean SaleM.D.   On: 02/18/2015 09:31   Mr Breast Bilateral W Wo Contrast  02/17/2015   CLINICAL DATA:  Biopsy-proven left breast carcinoma on 10/08/2014, grade 3, ER/PR negative, her-2 positive.  Biopsy proven metastatic lymph node within the left axilla, also biopsied on 10/08/2014.  LABS:  Not applicable  EXAM: BILATERAL BREAST MRI WITH AND WITHOUT CONTRAST  TECHNIQUE: Multiplanar, multisequence MR images of both breasts were obtained prior to and  following the intravenous administration of 9 ml of MultiHance.  THREE-DIMENSIONAL MR IMAGE RENDERING ON INDEPENDENT WORKSTATION:  Three-dimensional MR images were rendered by post-processing of the original MR data on an independent workstation. The three-dimensional MR images were interpreted, and findings are reported in the following complete MRI report for this study. Three dimensional images were evaluated at the independent DynaCad workstation  COMPARISON:  Previous breast MRI dated 10/22/2014. Comparison is also made to diagnostic mammogram and ultrasound dated 10/03/2014 and ultrasound-guided biopsy dated 10/08/2014.  FINDINGS: Breast composition: c. Heterogeneous fibroglandular tissue.  Background parenchymal enhancement: Mild  Right breast: There is no suspicious mass or non-mass enhancement identified within the right breast. The presumed mass identified within the upper-outer quadrant of the right breast on the previous MRI is no longer visualized,  suggesting it was merely background enhancement related to normal fibroglandular tissues. Simple cyst is again identified within the outer right breast, 9 o'clock axis, as also seen on earlier right breast ultrasound.  Left breast: There is now no suspicious mass or non-mass enhancement identified within the left breast. The biopsy-proven carcinoma (irregular enhancing mass) previously seen within the upper-outer quadrant is no longer visualized. Only the biopsy clip artifact is visualized. There is now no evidence of multifocal or multicentric disease identified within the left breast.  Lymph nodes: There are now no enlarged or morphologically abnormal lymph nodes identified within either axillary or internal mammary chain region. Incidental note is made of a benign tortuous/ectatic blood vessel adjacent to the right internal mammary chain.  Ancillary findings:  None.  IMPRESSION: No residual mass or abnormal enhancement within the LEFT breast consistent with excellent response to interval treatment.  No enlarged lymph nodes are now identified within the LEFT axilla, again consistent with good response to interval treatment.  No evidence of malignancy within the RIGHT breast.  RECOMMENDATION: Per treatment plan.  BI-RADS CATEGORY  2: Benign. Biopsy clip artifact within the upper-outer quadrant of the left breast. Patient does have known biopsy-proven left breast invasive carcinoma, and biopsy-proven left axillary lymphadenopathy, status post good response to interval chemotherapy with no residual abnormality identified on today's MRI.   Electronically Signed   By: Franki Cabot M.D.   On: 02/17/2015 15:33    Impression:  Clinical stage IIA high-grade invasive ductal carcinoma of the left breast. As above the patient underwent neo-adjuvant treatment with excellent clinical and radiographic response. I discussed with the patient and her husband that prior to her neo-adjuvant treatment she had several  radiographically involved axillary lymph nodes as well as subpectoral lymph nodes. I also discussed with the patient that the subpectoral lymph node areas would not be dissected at the time of her axillary dissection. Without postmastectomy irradiation we would have to rely on chemotherapy alone to sterilize the involved subpectoral nodes. Given these issues I would recommend postmastectomy irradiation even if the patient has a complete pathologic response. I have discussed this with my radiation oncology colleagues who also agree with recommendations for postmastectomy irradiation in this situation. If the patient had not proceeded with neoadjuvant treatment she likely would've had 4 or more positive lymph nodes thus warranting postmastectomy irradiation therapy. The patient does understand this is an area of investigation and is somewhat controversial and that 10 years from now we may have enough data to say that avoiding radiation therapy in this situation does not impact local control or survival. In addition if the patient does not have a complete pathologic (nodal) response then post mastectomy radiation  therapy would be indicated in light of retrospective evidence suggesting a higher rate of recurrence in these patients.  Plan:  Patient is planning her surgery in early October and will discuss further reconstruction plans with Dr. Harlow Mares as it relates to  recommendations for postmastectomy irradiation.   ____________________________________ Gery Pray, MD

## 2015-03-18 ENCOUNTER — Telehealth: Payer: Self-pay | Admitting: *Deleted

## 2015-03-18 NOTE — Telephone Encounter (Signed)
Received call from patient stating her insurance is changing October 1st.  This will cause a delay in her surgery.  Informed Dr. Lindi Adie.

## 2015-03-27 ENCOUNTER — Ambulatory Visit (HOSPITAL_BASED_OUTPATIENT_CLINIC_OR_DEPARTMENT_OTHER): Payer: PRIVATE HEALTH INSURANCE

## 2015-03-27 VITALS — BP 108/75 | HR 83 | Temp 98.6°F | Resp 20

## 2015-03-27 DIAGNOSIS — Z5112 Encounter for antineoplastic immunotherapy: Secondary | ICD-10-CM | POA: Diagnosis not present

## 2015-03-27 DIAGNOSIS — T451X5A Adverse effect of antineoplastic and immunosuppressive drugs, initial encounter: Principal | ICD-10-CM

## 2015-03-27 DIAGNOSIS — C773 Secondary and unspecified malignant neoplasm of axilla and upper limb lymph nodes: Secondary | ICD-10-CM | POA: Diagnosis not present

## 2015-03-27 DIAGNOSIS — C50412 Malignant neoplasm of upper-outer quadrant of left female breast: Secondary | ICD-10-CM

## 2015-03-27 DIAGNOSIS — D6481 Anemia due to antineoplastic chemotherapy: Secondary | ICD-10-CM

## 2015-03-27 MED ORDER — ACETAMINOPHEN 325 MG PO TABS
ORAL_TABLET | ORAL | Status: AC
Start: 2015-03-27 — End: 2015-03-27
  Filled 2015-03-27: qty 2

## 2015-03-27 MED ORDER — DIPHENHYDRAMINE HCL 25 MG PO CAPS
ORAL_CAPSULE | ORAL | Status: AC
  Filled 2015-03-27: qty 2

## 2015-03-27 MED ORDER — HEPARIN SOD (PORK) LOCK FLUSH 100 UNIT/ML IV SOLN
500.0000 [IU] | Freq: Once | INTRAVENOUS | Status: AC | PRN
  Administered 2015-03-27: 500 [IU]
  Filled 2015-03-27: qty 5

## 2015-03-27 MED ORDER — TRASTUZUMAB CHEMO INJECTION 440 MG
6.0000 mg/kg | Freq: Once | INTRAVENOUS | Status: AC
  Administered 2015-03-27: 294 mg via INTRAVENOUS
  Filled 2015-03-27: qty 14

## 2015-03-27 MED ORDER — SODIUM CHLORIDE 0.9 % IJ SOLN
10.0000 mL | INTRAMUSCULAR | Status: DC | PRN
  Administered 2015-03-27: 10 mL
  Filled 2015-03-27: qty 10

## 2015-03-27 MED ORDER — SODIUM CHLORIDE 0.9 % IV SOLN
Freq: Once | INTRAVENOUS | Status: AC
  Administered 2015-03-27: 15:00:00 via INTRAVENOUS

## 2015-03-27 NOTE — Progress Notes (Signed)
Pt c/o leg cramps for 2 weeks. Varney Biles RN Breast Navigator spoke with Dr. Lindi Adie and this writer received a call saying that Dr. Lindi Adie recommends that the patient try drinking tonic water and take vitamin B 12. Pt instructed and expressed understanding.

## 2015-03-27 NOTE — Patient Instructions (Addendum)
[facility] Discharge Instructions for Patients Receiving Chemotherapy  Today you received the following chemotherapy agents herceptin  To help prevent nausea and vomiting after your treatment, we encourage you to take your nausea medication   If you develop nausea and vomiting that is not controlled by your nausea medication, call the clinic.   BELOW ARE SYMPTOMS THAT SHOULD BE REPORTED IMMEDIATELY:  *FEVER GREATER THAN 100.5 F  *CHILLS WITH OR WITHOUT FEVER  NAUSEA AND VOMITING THAT IS NOT CONTROLLED WITH YOUR NAUSEA MEDICATION  *UNUSUAL SHORTNESS OF BREATH  *UNUSUAL BRUISING OR BLEEDING  TENDERNESS IN MOUTH AND THROAT WITH OR WITHOUT PRESENCE OF ULCERS  *URINARY PROBLEMS  *BOWEL PROBLEMS  UNUSUAL RASH Items with * indicate a potential emergency and should be followed up as soon as possible.  Feel free to call the clinic you have any questions or concerns. The clinic phone number is [phone].  Please show the [facility] at check-in to the Emergency Department and triage nurse.  Trastuzumab injection for infusion What is this medicine? TRASTUZUMAB (tras TOO zoo mab) is a monoclonal antibody. It targets a protein called HER2. This protein is found in some stomach and breast cancers. This medicine can stop cancer cell growth. This medicine may be used with other cancer treatments. This medicine may be used for other purposes; ask your health care provider or pharmacist if you have questions. COMMON BRAND NAME(S): Herceptin What should I tell my health care provider before I take this medicine? They need to know if you have any of these conditions: -heart disease -heart failure -infection (especially a virus infection such as chickenpox, cold sores, or herpes) -lung or breathing disease, like asthma -recent or ongoing radiation therapy -an unusual or allergic reaction to trastuzumab, benzyl alcohol, or other medications, foods, dyes, or  preservatives -pregnant or trying to get pregnant -breast-feeding How should I use this medicine? This drug is given as an infusion into a vein. It is administered in a hospital or clinic by a specially trained health care professional. Talk to your pediatrician regarding the use of this medicine in children. This medicine is not approved for use in children. Overdosage: If you think you have taken too much of this medicine contact a poison control center or emergency room at once. NOTE: This medicine is only for you. Do not share this medicine with others. What if I miss a dose? It is important not to miss a dose. Call your doctor or health care professional if you are unable to keep an appointment. What may interact with this medicine? -cyclophosphamide -doxorubicin -warfarin This list may not describe all possible interactions. Give your health care provider a list of all the medicines, herbs, non-prescription drugs, or dietary supplements you use. Also tell them if you smoke, drink alcohol, or use illegal drugs. Some items may interact with your medicine. What should I watch for while using this medicine? Visit your doctor for checks on your progress. Report any side effects. Continue your course of treatment even though you feel ill unless your doctor tells you to stop. Call your doctor or health care professional for advice if you get a fever, chills or sore throat, or other symptoms of a cold or flu. Do not treat yourself. Try to avoid being around people who are sick. You may experience fever, chills and shaking during your first infusion. These effects are usually mild and can be treated with other medicines. Report any side effects during the infusion  to your health care professional. Fever and chills usually do not happen with later infusions. What side effects may I notice from receiving this medicine? Side effects that you should report to your doctor or other health care  professional as soon as possible: -breathing difficulties -chest pain or palpitations -cough -dizziness or fainting -fever or chills, sore throat -skin rash, itching or hives -swelling of the legs or ankles -unusually weak or tired Side effects that usually do not require medical attention (report to your doctor or other health care professional if they continue or are bothersome): -loss of appetite -headache -muscle aches -nausea This list may not describe all possible side effects. Call your doctor for medical advice about side effects. You may report side effects to FDA at 1-800-FDA-1088. Where should I keep my medicine? This drug is given in a hospital or clinic and will not be stored at home. NOTE: This sheet is a summary. It may not cover all possible information. If you have questions about this medicine, talk to your doctor, pharmacist, or health care provider.  2015, [employer]. (2009-04-18 13:43:15)

## 2015-04-09 ENCOUNTER — Other Ambulatory Visit: Payer: Self-pay | Admitting: *Deleted

## 2015-04-09 DIAGNOSIS — C50412 Malignant neoplasm of upper-outer quadrant of left female breast: Secondary | ICD-10-CM

## 2015-04-11 ENCOUNTER — Telehealth: Payer: Self-pay | Admitting: Hematology and Oncology

## 2015-04-11 NOTE — Telephone Encounter (Signed)
Called and left a message with 10/20 appointments as well as added other hercep

## 2015-04-15 ENCOUNTER — Other Ambulatory Visit: Payer: Self-pay

## 2015-04-15 DIAGNOSIS — C50412 Malignant neoplasm of upper-outer quadrant of left female breast: Secondary | ICD-10-CM

## 2015-04-16 ENCOUNTER — Other Ambulatory Visit: Payer: Self-pay

## 2015-04-16 DIAGNOSIS — C50412 Malignant neoplasm of upper-outer quadrant of left female breast: Secondary | ICD-10-CM

## 2015-04-17 ENCOUNTER — Telehealth: Payer: Self-pay | Admitting: Hematology and Oncology

## 2015-04-17 ENCOUNTER — Other Ambulatory Visit (HOSPITAL_BASED_OUTPATIENT_CLINIC_OR_DEPARTMENT_OTHER): Payer: BLUE CROSS/BLUE SHIELD

## 2015-04-17 ENCOUNTER — Encounter: Payer: Self-pay | Admitting: Hematology and Oncology

## 2015-04-17 ENCOUNTER — Ambulatory Visit (HOSPITAL_BASED_OUTPATIENT_CLINIC_OR_DEPARTMENT_OTHER): Payer: BLUE CROSS/BLUE SHIELD

## 2015-04-17 ENCOUNTER — Ambulatory Visit (HOSPITAL_BASED_OUTPATIENT_CLINIC_OR_DEPARTMENT_OTHER): Payer: BLUE CROSS/BLUE SHIELD | Admitting: Hematology and Oncology

## 2015-04-17 VITALS — BP 95/53 | HR 69 | Temp 98.1°F | Resp 19 | Ht 60.0 in | Wt 112.8 lb

## 2015-04-17 DIAGNOSIS — C773 Secondary and unspecified malignant neoplasm of axilla and upper limb lymph nodes: Secondary | ICD-10-CM

## 2015-04-17 DIAGNOSIS — C50412 Malignant neoplasm of upper-outer quadrant of left female breast: Secondary | ICD-10-CM

## 2015-04-17 DIAGNOSIS — R21 Rash and other nonspecific skin eruption: Secondary | ICD-10-CM

## 2015-04-17 DIAGNOSIS — D6481 Anemia due to antineoplastic chemotherapy: Secondary | ICD-10-CM

## 2015-04-17 DIAGNOSIS — Z171 Estrogen receptor negative status [ER-]: Secondary | ICD-10-CM

## 2015-04-17 DIAGNOSIS — G43909 Migraine, unspecified, not intractable, without status migrainosus: Secondary | ICD-10-CM

## 2015-04-17 DIAGNOSIS — T451X5A Adverse effect of antineoplastic and immunosuppressive drugs, initial encounter: Principal | ICD-10-CM

## 2015-04-17 DIAGNOSIS — Z5112 Encounter for antineoplastic immunotherapy: Secondary | ICD-10-CM | POA: Diagnosis not present

## 2015-04-17 LAB — CBC WITH DIFFERENTIAL/PLATELET
BASO%: 1 % (ref 0.0–2.0)
BASOS ABS: 0.1 10*3/uL (ref 0.0–0.1)
EOS ABS: 0.2 10*3/uL (ref 0.0–0.5)
EOS%: 4.3 % (ref 0.0–7.0)
HEMATOCRIT: 32.6 % — AB (ref 34.8–46.6)
HEMOGLOBIN: 10.7 g/dL — AB (ref 11.6–15.9)
LYMPH#: 1.3 10*3/uL (ref 0.9–3.3)
LYMPH%: 24 % (ref 14.0–49.7)
MCH: 30.1 pg (ref 25.1–34.0)
MCHC: 32.9 g/dL (ref 31.5–36.0)
MCV: 91.6 fL (ref 79.5–101.0)
MONO#: 0.4 10*3/uL (ref 0.1–0.9)
MONO%: 7.3 % (ref 0.0–14.0)
NEUT#: 3.5 10*3/uL (ref 1.5–6.5)
NEUT%: 63.4 % (ref 38.4–76.8)
Platelets: 231 10*3/uL (ref 145–400)
RBC: 3.56 10*6/uL — ABNORMAL LOW (ref 3.70–5.45)
RDW: 14.2 % (ref 11.2–14.5)
WBC: 5.5 10*3/uL (ref 3.9–10.3)

## 2015-04-17 LAB — COMPREHENSIVE METABOLIC PANEL (CC13)
ALBUMIN: 3.6 g/dL (ref 3.5–5.0)
ALK PHOS: 104 U/L (ref 40–150)
ALT: 19 U/L (ref 0–55)
ANION GAP: 7 meq/L (ref 3–11)
AST: 30 U/L (ref 5–34)
BUN: 16.3 mg/dL (ref 7.0–26.0)
CALCIUM: 9.5 mg/dL (ref 8.4–10.4)
CHLORIDE: 109 meq/L (ref 98–109)
CO2: 28 mEq/L (ref 22–29)
Creatinine: 0.7 mg/dL (ref 0.6–1.1)
Glucose: 90 mg/dl (ref 70–140)
POTASSIUM: 4 meq/L (ref 3.5–5.1)
Sodium: 143 mEq/L (ref 136–145)
Total Bilirubin: 0.3 mg/dL (ref 0.20–1.20)
Total Protein: 6.4 g/dL (ref 6.4–8.3)

## 2015-04-17 MED ORDER — DIPHENHYDRAMINE HCL 25 MG PO CAPS: 50.0000 mg | ORAL_CAPSULE | Freq: Once | ORAL | Status: DC

## 2015-04-17 MED ORDER — HEPARIN SOD (PORK) LOCK FLUSH 100 UNIT/ML IV SOLN
500.0000 [IU] | Freq: Once | INTRAVENOUS | Status: AC | PRN
  Administered 2015-04-17: 500 [IU]
  Filled 2015-04-17: qty 5

## 2015-04-17 MED ORDER — ACETAMINOPHEN 325 MG PO TABS: 650.0000 mg | ORAL_TABLET | Freq: Once | ORAL | Status: DC

## 2015-04-17 MED ORDER — SODIUM CHLORIDE 0.9 % IJ SOLN
10.0000 mL | INTRAMUSCULAR | Status: DC | PRN
  Administered 2015-04-17: 10 mL
  Filled 2015-04-17: qty 10

## 2015-04-17 MED ORDER — SODIUM CHLORIDE 0.9 % IV SOLN
Freq: Once | INTRAVENOUS | Status: AC
  Administered 2015-04-17: 10:00:00 via INTRAVENOUS

## 2015-04-17 MED ORDER — TRASTUZUMAB CHEMO INJECTION 440 MG
6.0000 mg/kg | Freq: Once | INTRAVENOUS | Status: AC
  Administered 2015-04-17: 294 mg via INTRAVENOUS
  Filled 2015-04-17: qty 14

## 2015-04-17 NOTE — Telephone Encounter (Signed)
Appointments made and avs printed for patient °

## 2015-04-17 NOTE — Patient Instructions (Signed)
[facility] Discharge Instructions for Patients Receiving Chemotherapy  Today you received the following chemotherapy agents Herceptin  To help prevent nausea and vomiting after your treatment, we encourage you to take your nausea medication    If you develop nausea and vomiting that is not controlled by your nausea medication, call the clinic.   BELOW ARE SYMPTOMS THAT SHOULD BE REPORTED IMMEDIATELY:  *FEVER GREATER THAN 100.5 F  *CHILLS WITH OR WITHOUT FEVER  NAUSEA AND VOMITING THAT IS NOT CONTROLLED WITH YOUR NAUSEA MEDICATION  *UNUSUAL SHORTNESS OF BREATH  *UNUSUAL BRUISING OR BLEEDING  TENDERNESS IN MOUTH AND THROAT WITH OR WITHOUT PRESENCE OF ULCERS  *URINARY PROBLEMS  *BOWEL PROBLEMS  UNUSUAL RASH Items with * indicate a potential emergency and should be followed up as soon as possible.  Feel free to call the clinic you have any questions or concerns. The clinic phone number is [phone].  Please show the CHEMO ALERT CARD at check-in to the Emergency Department and triage nurse.   

## 2015-04-17 NOTE — Progress Notes (Signed)
Patient Care Team: Laray Anger, MD as PCP - General (Obstetrics and Gynecology) Erroll Luna, MD as Consulting Physician (General Surgery) Nicholas Lose, MD as Consulting Physician (Hematology and Oncology) Gery Pray, MD as Consulting Physician (Radiation Oncology) Mauro Kaufmann, RN as Registered Nurse Rockwell Germany, RN as Registered Nurse  DIAGNOSIS: Breast cancer of upper-outer quadrant of left female breast [facility])   Staging form: Breast, AJCC 7th Edition     Clinical stage from 10/16/2014: Stage IIA (T1c, N1, M0) - Unsigned   SUMMARY OF ONCOLOGIC HISTORY:   Breast cancer of upper-outer quadrant of left female breast ([name] [name])   10/01/2014 Mammogram Left breast mammogram and ultrasound for palpable left breast mass which was a maxillary mass with an ill-defined abnormality by ultrasound 1.7 cm at 2:00   10/11/2014 Initial Diagnosis Left breast invasive ductal carcinoma, grade 3, ER/PR negative, 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 5.32, axillary lymph node also positive for cancer   10/30/2014 Imaging CT chest abdomen pelvis and bone scan revealed no evidence of distant metastatic disease but enlarged left axillary and left subpectoral lymph nodes   11/01/2014 -  Neo-Adjuvant Chemotherapy Neoadjuvant TCH Perjeta to 3 weeks 6 cycles followed by Herceptin maintenance   02/17/2015 Breast MRI No residual mass or abnormal enhancement, no enlarged lymph nodes    CHIEF COMPLIANT: Follow-up on Herceptin maintenance  INTERVAL HISTORY: [name] [name] is a 54 year old with above-mentioned history of left breast cancer treated with neoadjuvant chemotherapy with [facility]. She is here for Herceptin maintenance. She had a complete clinical response to chemotherapy. She is waiting on surgery. He is planning on getting bilateral mastectomies. Because of the subpectoral lymph node involvement pre-chemotherapy, she will be recommended to receive adjuvant radiation.  REVIEW OF SYSTEMS:   Constitutional: Denies fevers,  chills or abnormal weight loss Eyes: Denies blurriness of vision Ears, nose, mouth, throat, and face: Denies mucositis or sore throat Respiratory: Denies cough, dyspnea or wheezes Cardiovascular: Denies palpitation, chest discomfort or lower extremity swelling Gastrointestinal:  Denies nausea, heartburn or change in bowel habits Skin: Skin rash around the mouth Lymphatics: Denies new lymphadenopathy or easy bruising Neurological: Musculoskeletal aches and pains in the legs and knees Behavioral/Psych: Mood is stable, no new changes  Breast:  denies any pain or lumps or nodules in either breasts All other systems were reviewed with the patient and are negative.  I have reviewed the past medical history, past surgical history, social history and family history with the patient and they are unchanged from previous note.  ALLERGIES:  is allergic to butorphanol; meperidine; and sulfa antibiotics.  MEDICATIONS:  Current Outpatient Prescriptions  Medication Sig Dispense Refill  . acetaminophen (TYLENOL) 325 MG tablet Take 650 mg by mouth every 6 (six) hours as needed.    . Aspirin-Acetaminophen-Caffeine (GOODYS EXTRA STRENGTH PO) Take by mouth.    Marland Kitchen atenolol (TENORMIN) 25 MG tablet Take 75 mg by mouth daily. Takes 3 25 mg tablets daily    . atenolol-chlorthalidone (TENORETIC) 50-25 MG per tablet Take by mouth. Pt takes 75-25 mg of this medication    . B Complex-Biotin-FA (B-COMPLEX PO) Take by mouth.    Marland Kitchen BLACK COHOSH PO Take by mouth.    . Calcium Carbonate (CALCIUM 600 PO) Take by mouth.    . dexamethasone (DECADRON) 2 MG tablet Take 2 mg by mouth. Takes daily for migraines until prescribed 4 mg regimen starts    . dexamethasone (DECADRON) 4 MG tablet Take 1 tablet (4 mg total) by mouth 2 (two) times  daily. Start the day before Taxotere. Then again the day after chemo for 3 days. (Patient not taking: Reported on 03/11/2015) 30 tablet 1  . Diclofenac Potassium (CAMBIA PO) Take by mouth daily as  needed.    . diphenoxylate-atropine (LOMOTIL) 2.5-0.025 MG per tablet Take 1 tablet by mouth 4 (four) times daily as needed for diarrhea or loose stools. 30 tablet 0  . eletriptan (RELPAX) 40 MG tablet Take 1 tablet (40 mg total) by mouth every 2 (two) hours as needed for migraine or headache (Max 2 per day). 10 tablet 3  . EVENING PRIMROSE OIL PO Take 1,300 mg by mouth.    . hydrocortisone 2.5 % ointment Apply topically 2 (two) times daily. 30 g 0  . ibuprofen (ADVIL,MOTRIN) 100 MG tablet Take 100 mg by mouth every 6 (six) hours as needed for fever.    . Lactobacillus (ACIDOPHILUS PO) Take by mouth.    . lidocaine-prilocaine (EMLA) cream Apply to affected area once 30 g 3  . LORazepam (ATIVAN) 0.5 MG tablet Take 1 tablet (0.5 mg total) by mouth every 6 (six) hours as needed (Nausea or vomiting). 45 tablet 0  . oxyCODONE-acetaminophen (ROXICET) 5-325 MG per tablet Take 1 tablet by mouth every 4 (four) hours as needed. (Patient not taking: Reported on 03/11/2015) 30 tablet 0  . PARoxetine (PAXIL) 20 MG tablet Take 20 mg by mouth.    . prochlorperazine (COMPAZINE) 10 MG tablet Take 1 tablet (10 mg total) by mouth every 6 (six) hours as needed (Nausea or vomiting). (Patient not taking: Reported on 03/11/2015) 30 tablet 1  . promethazine (PHENERGAN) 12.5 MG tablet Take 1 tablet (12.5 mg total) by mouth every 6 (six) hours as needed for nausea or vomiting. 90 tablet 3  . solifenacin (VESICARE) 5 MG tablet Take 10 mg by mouth.    Marland Kitchen VITAMIN D, CHOLECALCIFEROL, PO Take by mouth.     No current facility-administered medications for this visit.    PHYSICAL EXAMINATION: ECOG PERFORMANCE STATUS: 1 - Symptomatic but completely ambulatory  There were no vitals filed for this visit. There were no vitals filed for this visit.  GENERAL:alert, no distress and comfortable SKIN: skin color, texture, turgor are normal, no rashes or significant lesions EYES: normal, Conjunctiva are pink and non-injected, sclera  clear OROPHARYNX:no exudate, no erythema and lips, buccal mucosa, and tongue normal  NECK: supple, thyroid normal size, non-tender, without nodularity LYMPH:  no palpable lymphadenopathy in the cervical, axillary or inguinal LUNGS: clear to auscultation and percussion with normal breathing effort HEART: regular rate & rhythm and no murmurs and no lower extremity edema ABDOMEN:abdomen soft, non-tender and normal bowel sounds Musculoskeletal:no cyanosis of digits and no clubbing  NEURO: alert & oriented x 3 with fluent speech, no focal motor/sensory deficits  LABORATORY DATA:  I have reviewed the data as listed   Chemistry      Component Value Date/Time   NA 143 03/06/2015 1102   K 3.9 03/06/2015 1102   CO2 25 03/06/2015 1102   BUN 10.4 03/06/2015 1102   CREATININE 0.7 03/06/2015 1102      Component Value Date/Time   CALCIUM 9.2 03/06/2015 1102   ALKPHOS 93 03/06/2015 1102   AST 26 03/06/2015 1102   ALT 34 03/06/2015 1102   BILITOT 0.35 03/06/2015 1102       Lab Results  Component Value Date   WBC 5.5 04/17/2015   HGB 10.7* 04/17/2015   HCT 32.6* 04/17/2015   MCV 91.6 04/17/2015  PLT 231 04/17/2015   NEUTROABS 3.5 04/17/2015   ASSESSMENT & PLAN:  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 Patient wants to do bilateral mastectomies. She is not a candidate for [facility] clinical trial. 2. continue maintenance Herceptin every 3  weeks  Upper respiratory infection: Resolved with antibiotics Chemotherapy-induced anemia Migraine disorder: Off steroids  Rash around the lips: Encouraged her to use hydrocortisone cream  Return to clinic in 6 weeks for follow-up. Patient will likely have surgery November 3   No problem-specific assessment & plan notes found for this encounter.   No orders of the defined types were placed in this encounter.   The patient has a good understanding of the overall plan. she agrees with it. she will call with any problems that may develop before the next visit here.   Rulon Eisenmenger, MD 04/17/2015

## 2015-04-17 NOTE — Addendum Note (Signed)
Addended by: Prentiss Bells on: 04/17/2015 09:46 AM   Modules accepted: Medications

## 2015-04-25 ENCOUNTER — Other Ambulatory Visit (HOSPITAL_COMMUNITY): Payer: Self-pay | Admitting: Plastic Surgery

## 2015-05-01 ENCOUNTER — Ambulatory Visit (HOSPITAL_BASED_OUTPATIENT_CLINIC_OR_DEPARTMENT_OTHER)
Admission: RE | Admit: 2015-05-01 | Discharge: 2015-05-01 | Disposition: A | Discharge status: Home / Self Care | Payer: BLUE CROSS/BLUE SHIELD | Source: Ambulatory Visit | Attending: Internal Medicine | Admitting: Internal Medicine

## 2015-05-01 ENCOUNTER — Telehealth: Payer: Self-pay | Admitting: Hematology and Oncology

## 2015-05-01 ENCOUNTER — Encounter (HOSPITAL_COMMUNITY): Payer: Self-pay | Admitting: Internal Medicine

## 2015-05-01 ENCOUNTER — Ambulatory Visit (HOSPITAL_COMMUNITY)
Admission: RE | Admit: 2015-05-01 | Discharge: 2015-05-01 | Disposition: A | Discharge status: Home / Self Care | Payer: BLUE CROSS/BLUE SHIELD | Source: Ambulatory Visit | Attending: Obstetrics and Gynecology | Admitting: Obstetrics and Gynecology

## 2015-05-01 VITALS — BP 104/62 | HR 64 | Wt 102.2 lb

## 2015-05-01 DIAGNOSIS — I340 Nonrheumatic mitral (valve) insufficiency: Secondary | ICD-10-CM | POA: Diagnosis not present

## 2015-05-01 DIAGNOSIS — C50412 Malignant neoplasm of upper-outer quadrant of left female breast: Secondary | ICD-10-CM | POA: Diagnosis present

## 2015-05-01 NOTE — Telephone Encounter (Signed)
Patient called in to move her 11/10 tx later in the day due to preop,done,and left a message with new time

## 2015-05-01 NOTE — Progress Notes (Signed)
Cardio-Oncology Clinic Consult Note   Patient ID: [name] [name], female   DOB: September 22, 1960, 54 y.o.   MRN: [id number]  Referring Physician: Dr. Gevena Mart Primary Care: Primary Cardiologist:  HPI: [name] [name] is a 54 y.o. female diagnosed with Breast CA in 09/2014. Breast cancer profile includes left breast invasive ductal carcinoma Stage 2b, grade 3, ER/PR negative, 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 5.32, axillary lymph node also positive for cancer. CT C/A/P with no evidence of metastasis but enlarged left axillary and left subpectoral lymph nodes. She presents today to establish in the Cardio-oncology clinic.  Pt began neoadjuvant [place] Perjeta to 3 weeks 6 cycles followed by Herceptin maintenance 11/01/2014. Would finish Herceptin 10/2015. She has 3 Echos so far.  Echo 10/21/14 LVEF 60-65%, Mild MR   Global strain -23.4 Echo 01/22/15 LVEF 55-60%, Mild to Mod MR Lateral S' 13.6, Global strain -23.2 Echo 05/01/15 LVEF 55-60%, Global Strain -21.1 per Dr. Haroldine Laws   Still able to play tennis, jog, and do exercise tapes without difficulty.  Has decreased exercise tolerance from before, but had also not been very active for the first 4-5 months of her diagnosis. Activity level has been increasing since she started back. No problems with ADLs. No CP, orthopnea, PND, dizziness/lightheadedness, edema, or near syncope.  Does have pain in calves and thighs with walking, that has become worse now that she is off steroids.  Is on atenolol for migraines.  Review of Systems: [y] = yes, [ ] = no   General: Weight gain [ ]; Weight loss [ ]; Anorexia [ ]; Fatigue [ ]; Fever [ ]; Chills [ ]; Weakness [ ]  Cardiac: Chest pain/pressure [ ]; Resting SOB [ ]; Exertional SOB [ ]; Orthopnea [ ]; Pedal Edema [ ]; Palpitations [ ]; Syncope [ ]; Presyncope [ ]; Paroxysmal nocturnal dyspnea[ ]  Pulmonary: Cough [ ]; Wheezing[ ]; Hemoptysis[ ]; Sputum [ ]; Snoring [ ]  GI: Vomiting[ ]; Dysphagia[ ]; Melena[ ]; Hematochezia [ ];  Heartburn[ ]; Abdominal pain [ ]; Constipation [ ]; Diarrhea [ ]; BRBPR [ ]  GU: Hematuria[ ]; Dysuria [ ]; Nocturia[ ]  Vascular: Pain in legs with walking [y]; Pain in feet with lying flat [ ]; Non-healing sores [ ]; Stroke [ ]; TIA [ ]; Slurred speech [ ];  Neuro: Headaches[ ]; Vertigo[ ]; Seizures[ ]; Paresthesias[ ];Blurred vision [ ]; Diplopia [ ]; Vision changes [ ]  Ortho/Skin: Arthritis [y]; Joint pain [y]; Muscle pain [ ]; Joint swelling [ ]; Back Pain [ ]; Rash [ ]  Psych: Depression[ ]; Anxiety[ ]  Heme: Bleeding problems [ ]; Clotting disorders [ ]; Anemia [ ]  Endocrine: Diabetes [ ]; Thyroid dysfunction[ ]  Past Medical History  Diagnosis Date  . Osteopenia   . Migraines   . Neurogenic bladder   . Wears contact lenses   . Family history of breast cancer   . Family history of colon cancer   . Breast cancer of upper-outer quadrant of right female breast ([place]) 10/11/2014  . Breast cancer ([place]) 2016    ER-/PR-/Her2+   Current Outpatient Prescriptions  Medication Sig Dispense Refill  . acetaminophen (TYLENOL) 325 MG tablet Take 650 mg by mouth every 6 (six) hours as needed.    . Aspirin-Acetaminophen-Caffeine (GOODYS EXTRA STRENGTH PO) Take by mouth.    Marland Kitchen atenolol (TENORMIN) 25 MG tablet Take 75 mg by mouth daily. Takes 3 25 mg tablets daily    . atenolol-chlorthalidone (TENORETIC) 50-25 MG  per tablet Take by mouth. Pt takes 75-25 mg of this medication    . B Complex-Biotin-FA (B-COMPLEX PO) Take by mouth.    Marland Kitchen BLACK COHOSH PO Take by mouth.    . Calcium Carbonate (CALCIUM 600 PO) Take by mouth.    . hydrocortisone 2.5 % ointment Apply topically 2 (two) times daily. 30 g 0  . ibuprofen (ADVIL,MOTRIN) 100 MG tablet Take 100 mg by mouth every 6 (six) hours as needed for fever.    . Lactobacillus (ACIDOPHILUS PO) Take by mouth.    . lidocaine-prilocaine (EMLA) cream Apply to affected area once 30 g 3  . PARoxetine (PAXIL) 20 MG tablet Take 20 mg by mouth.    Marland Kitchen VITAMIN D,  CHOLECALCIFEROL, PO Take by mouth.    . dexamethasone (DECADRON) 2 MG tablet Take 2 mg by mouth. Takes daily for migraines until prescribed 4 mg regimen starts    . dexamethasone (DECADRON) 4 MG tablet Take 1 tablet (4 mg total) by mouth 2 (two) times daily. Start the day before Taxotere. Then again the day after chemo for 3 days. (Patient not taking: Reported on 03/11/2015) 30 tablet 1  . Diclofenac Potassium (CAMBIA PO) Take by mouth daily as needed.    . diphenoxylate-atropine (LOMOTIL) 2.5-0.025 MG per tablet Take 1 tablet by mouth 4 (four) times daily as needed for diarrhea or loose stools. (Patient not taking: Reported on 05/01/2015) 30 tablet 0  . eletriptan (RELPAX) 40 MG tablet Take 1 tablet (40 mg total) by mouth every 2 (two) hours as needed for migraine or headache (Max 2 per day). (Patient not taking: Reported on 05/01/2015) 10 tablet 3  . EVENING PRIMROSE OIL PO Take 1,300 mg by mouth.    Marland Kitchen LORazepam (ATIVAN) 0.5 MG tablet Take 1 tablet (0.5 mg total) by mouth every 6 (six) hours as needed (Nausea or vomiting). (Patient not taking: Reported on 05/01/2015) 45 tablet 0  . oxyCODONE-acetaminophen (ROXICET) 5-325 MG per tablet Take 1 tablet by mouth every 4 (four) hours as needed. (Patient not taking: Reported on 05/01/2015) 30 tablet 0  . prochlorperazine (COMPAZINE) 10 MG tablet Take 1 tablet (10 mg total) by mouth every 6 (six) hours as needed (Nausea or vomiting). (Patient not taking: Reported on 05/01/2015) 30 tablet 1  . promethazine (PHENERGAN) 12.5 MG tablet Take 1 tablet (12.5 mg total) by mouth every 6 (six) hours as needed for nausea or vomiting. (Patient not taking: Reported on 05/01/2015) 90 tablet 3   No current facility-administered medications for this encounter.   Allergies  Allergen Reactions  . Butorphanol Nausea And Vomiting  . Meperidine Nausea And Vomiting  . Sulfa Antibiotics Rash    Social History   Social History  . Marital Status: Married    Spouse Name: N/A  .  Number of Children: 3  . 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 Smoking status: Never Smoker   . Smokeless tobacco: Not on file  . Alcohol Use: Yes     Comment: 4-5  . Drug Use: No  . Sexual Activity: Not on file   Other Topics Concern  . Not on file   Social History Narrative    Family History  Problem Relation Age of Onset  . Brain cancer Mother 82    Glioblastoma  . Colon cancer Father 38  . Colon cancer Maternal Uncle 19  . Lung cancer Paternal Aunt   .  Lung cancer Maternal Grandfather 90  . Lung cancer Paternal Grandfather 61  . Melanoma Brother 42    insitu  . Rheumatic fever Maternal Grandmother     in her 20s  . Breast cancer Paternal Grandmother 3   Filed Vitals:   05/01/15 1131  BP: 104/62  Pulse: 64  Weight: 102 lb 4 oz (46.38 kg)  SpO2: 100%    PHYSICAL EXAM: General:  Well appearing. No respiratory difficulty HEENT: Normal Neck: supple. no JVD. Carotids 2+ bilat; no bruits. No lymphadenopathy or thyromegaly appreciated. Cor: PMI nondisplaced. RRR. No rubs, gallops or murmurs. Lungs: CTA bilaterally, normal effort Abdomen: soft, NT, ND, No HSM. No bruits or masses. +BS Extremities: no cyanosis, clubbing, rash, edema. 2+ DP pulse bilaterally. Neuro: alert & oriented x 3, cranial nerves grossly intact. moves all 4 extremities w/o difficulty. Affect pleasant.   ASSESSMENT & PLAN:   1. Breast Cancer Profile:  Left breast invasive ductal carcinoma Stage 2b, grade 3, ER/PR negative, 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 5.32, axillary lymph node also positive for cancer. - Current plan is to perform surgery with spread to lymph nodes and to continue Herceptin until 10/2015 - Echo today shows LVEF 55-60%, Global Strain -21.1 per Dr. Haroldine Laws  Doing very well. Will follow up in 3 months with Echo.   Legrand Como "Jonni Sanger" [place], PA-C 05/01/2015   Patient seen and examined with Oda Kilts, PA-C. We  discussed all aspects of the encounter. I agree with the assessment and plan as stated above.   Explained incidence of Herceptin cardiotoxicity and role of Cardio-oncology clinic at length. Echo images reviewed personally. All parameters stable. Reviewed signs and symptoms of HF to look for. Continue Herceptin. Follow-up with echo in 3 months.  Bensimhon, Daniel,MD 10:20 PM

## 2015-05-01 NOTE — Progress Notes (Signed)
  Echocardiogram 2D Echocardiogram has been performed.  [name] [name] 05/01/2015, 11:05 AM

## 2015-05-01 NOTE — Patient Instructions (Signed)
We will contact you in 3 months to schedule your next appointment and echocardiogram  

## 2015-05-06 ENCOUNTER — Other Ambulatory Visit (HOSPITAL_COMMUNITY): Payer: PRIVATE HEALTH INSURANCE

## 2015-05-07 ENCOUNTER — Other Ambulatory Visit (HOSPITAL_COMMUNITY): Payer: Self-pay | Admitting: *Deleted

## 2015-05-07 NOTE — Pre-Procedure Instructions (Addendum)
[name] [name]  05/07/2015      [facility] - [place], [place] - [address] [place] [address] Phone: [phone] Fax: [phone]    Your procedure is scheduled on Wednesday, May 14, 2015 at 8:30 AM.   Report to [facility] Entrance "A" Admitting Office at 6:30 AM.   Call this number if you have problems the morning of surgery: [phone]   Any questions prior to day of surgery, please call [phone] between 8 & 4 PM.   Remember:  Do not eat food or drink liquids after midnight Tuesday, 05/13/15.   Take these medicines the morning of surgery with A SIP OF WATER: Atenolol (Tenormin), Paroxetine (Paxil), Tylenol - if needed  Stop Aspirin products and NSAIDS (Ibuprofen, Diclofenac) as of today.   Do not wear jewelry, make-up or nail polish.  Do not wear lotions, powders, or perfumes.  You may NOT wear deodorant.  Do not shave 48 hours prior to surgery.    Do not bring valuables to the hospital.  [facility] is not responsible for any belongings or valuables.  Contacts, dentures or bridgework may not be worn into surgery.  Leave your suitcase in the car.  After surgery it may be brought to your room.  For patients admitted to the hospital, discharge time will be determined by your treatment team.  Special instructions:  [place] - Preparing for Surgery  Before surgery, you can play an important role.  Because skin is not sterile, your skin needs to be as free of germs as possible.  You can reduce the number of germs on you skin by washing with CHG (chlorahexidine gluconate) soap before surgery.  CHG is an antiseptic cleaner which kills germs and bonds with the skin to continue killing germs even after washing.  Please DO NOT use if you have an allergy to CHG or antibacterial soaps.  If your skin becomes reddened/irritated stop using the CHG and inform your nurse when you arrive at Short Stay.  Do not shave (including  legs and underarms) for at least 48 hours prior to the first CHG shower.  You may shave your face.  Please follow these instructions carefully:   1.  Shower with CHG Soap the night before surgery and the   morning of Surgery.  2.  If you choose to wash your hair, wash your hair first as usual with your  normal shampoo.  3.  After you shampoo, rinse your hair and body thoroughly to remove the   Shampoo.  4.  Use CHG as you would any other liquid soap.  You can apply chg directly  to the skin and wash gently with scrungie or a clean washcloth.  5.  Apply the CHG Soap to your body ONLY FROM THE NECK DOWN.        Do not use on open wounds or open sores.  Avoid contact with your eyes, ears, mouth and genitals (private parts).  Wash genitals (private parts) with your normal soap.  6.  Wash thoroughly, paying special attention to the area where your surgery  will be performed.  7.  Thoroughly rinse your body with warm water from the neck down.  8.  DO NOT shower/wash with your normal soap after using and rinsing off  the CHG Soap.  9.  Pat yourself dry with a clean towel.            10.  Wear  clean pajamas.            11.  Place clean sheets on your bed the night of your first shower and do not sleep with pets.  Day of Surgery  Do not apply any lotions/deodorants the morning of surgery.  Please wear clean clothes to the hospital.   Please read over the following fact sheets that you were given. Pain Booklet, Coughing and Deep Breathing and Surgical Site Infection Prevention

## 2015-05-08 ENCOUNTER — Ambulatory Visit: Payer: Self-pay | Admitting: Surgery

## 2015-05-08 ENCOUNTER — Encounter: Payer: Self-pay | Admitting: General Practice

## 2015-05-08 ENCOUNTER — Encounter (HOSPITAL_COMMUNITY): Payer: Self-pay

## 2015-05-08 ENCOUNTER — Ambulatory Visit (HOSPITAL_BASED_OUTPATIENT_CLINIC_OR_DEPARTMENT_OTHER): Payer: BLUE CROSS/BLUE SHIELD

## 2015-05-08 ENCOUNTER — Encounter (HOSPITAL_COMMUNITY)
Admission: RE | Admit: 2015-05-08 | Discharge: 2015-05-08 | Disposition: A | Discharge status: Home / Self Care | Payer: BLUE CROSS/BLUE SHIELD | Source: Ambulatory Visit | Attending: Surgery | Admitting: Surgery

## 2015-05-08 VITALS — BP 95/41 | HR 65 | Temp 97.2°F | Resp 18 | Ht 60.5 in | Wt 109.7 lb

## 2015-05-08 DIAGNOSIS — Z5112 Encounter for antineoplastic immunotherapy: Secondary | ICD-10-CM | POA: Diagnosis not present

## 2015-05-08 DIAGNOSIS — C773 Secondary and unspecified malignant neoplasm of axilla and upper limb lymph nodes: Secondary | ICD-10-CM | POA: Diagnosis not present

## 2015-05-08 DIAGNOSIS — C50111 Malignant neoplasm of central portion of right female breast: Secondary | ICD-10-CM

## 2015-05-08 DIAGNOSIS — C50912 Malignant neoplasm of unspecified site of left female breast: Secondary | ICD-10-CM | POA: Diagnosis not present

## 2015-05-08 DIAGNOSIS — D6481 Anemia due to antineoplastic chemotherapy: Secondary | ICD-10-CM

## 2015-05-08 DIAGNOSIS — C50412 Malignant neoplasm of upper-outer quadrant of left female breast: Secondary | ICD-10-CM

## 2015-05-08 DIAGNOSIS — N6091 Unspecified benign mammary dysplasia of right breast: Secondary | ICD-10-CM | POA: Diagnosis not present

## 2015-05-08 DIAGNOSIS — Z01818 Encounter for other preprocedural examination: Secondary | ICD-10-CM | POA: Insufficient documentation

## 2015-05-08 DIAGNOSIS — T451X5A Adverse effect of antineoplastic and immunosuppressive drugs, initial encounter: Principal | ICD-10-CM

## 2015-05-08 LAB — CBC WITH DIFFERENTIAL/PLATELET
BASOS ABS: 0 10*3/uL (ref 0.0–0.1)
BASOS PCT: 1 %
EOS ABS: 0.1 10*3/uL (ref 0.0–0.7)
Eosinophils Relative: 2 %
HEMATOCRIT: 35.3 % — AB (ref 36.0–46.0)
Hemoglobin: 11.2 g/dL — ABNORMAL LOW (ref 12.0–15.0)
Lymphocytes Relative: 31 %
Lymphs Abs: 1.9 10*3/uL (ref 0.7–4.0)
MCH: 28.8 pg (ref 26.0–34.0)
MCHC: 31.7 g/dL (ref 30.0–36.0)
MCV: 90.7 fL (ref 78.0–100.0)
MONO ABS: 0.4 10*3/uL (ref 0.1–1.0)
MONOS PCT: 7 %
NEUTROS ABS: 3.6 10*3/uL (ref 1.7–7.7)
NEUTROS PCT: 59 %
Platelets: 184 10*3/uL (ref 150–400)
RBC: 3.89 MIL/uL (ref 3.87–5.11)
RDW: 13.1 % (ref 11.5–15.5)
WBC: 6 10*3/uL (ref 4.0–10.5)

## 2015-05-08 LAB — COMPREHENSIVE METABOLIC PANEL
ALBUMIN: 3.8 g/dL (ref 3.5–5.0)
ALT: 36 U/L (ref 14–54)
ANION GAP: 9 (ref 5–15)
AST: 44 U/L — AB (ref 15–41)
Alkaline Phosphatase: 107 U/L (ref 38–126)
BUN: 14 mg/dL (ref 6–20)
CHLORIDE: 100 mmol/L — AB (ref 101–111)
CO2: 29 mmol/L (ref 22–32)
Calcium: 9.6 mg/dL (ref 8.9–10.3)
Creatinine, Ser: 0.69 mg/dL (ref 0.44–1.00)
GFR calc Af Amer: >60 mL/min (ref >60)
GFR calc non Af Amer: >60 mL/min (ref >60)
GLUCOSE: 111 mg/dL — AB (ref 65–99)
POTASSIUM: 3.9 mmol/L (ref 3.5–5.1)
SODIUM: 138 mmol/L (ref 135–145)
TOTAL PROTEIN: 6.5 g/dL (ref 6.5–8.1)
Total Bilirubin: 0.4 mg/dL (ref 0.3–1.2)

## 2015-05-08 MED ORDER — ACETAMINOPHEN 325 MG PO TABS: 650.0000 mg | ORAL_TABLET | Freq: Once | ORAL | Status: DC

## 2015-05-08 MED ORDER — SODIUM CHLORIDE 0.9 % IV SOLN
Freq: Once | INTRAVENOUS | Status: AC
  Administered 2015-05-08: 11:00:00 via INTRAVENOUS

## 2015-05-08 MED ORDER — DIPHENHYDRAMINE HCL 25 MG PO CAPS
50.0000 mg | ORAL_CAPSULE | Freq: Once | ORAL | Status: DC
Start: 2015-05-08 — End: 2015-05-08

## 2015-05-08 MED ORDER — TRASTUZUMAB CHEMO INJECTION 440 MG
6.0000 mg/kg | Freq: Once | INTRAVENOUS | Status: AC
  Administered 2015-05-08: 294 mg via INTRAVENOUS
  Filled 2015-05-08: qty 14

## 2015-05-08 MED ORDER — SODIUM CHLORIDE 0.9 % IJ SOLN
10.0000 mL | INTRAMUSCULAR | Status: DC | PRN
  Administered 2015-05-08: 10 mL
  Filled 2015-05-08: qty 10

## 2015-05-08 MED ORDER — HEPARIN SOD (PORK) LOCK FLUSH 100 UNIT/ML IV SOLN
500.0000 [IU] | Freq: Once | INTRAVENOUS | Status: AC | PRN
  Administered 2015-05-08: 500 [IU]
  Filled 2015-05-08: qty 5

## 2015-05-08 NOTE — H&P (Signed)
H&P   [name] [name] (MR# [id number])      H&P Info    Author Note Status Last Update User Last Update Date/Time   Erroll Luna, MD Signed Erroll Luna, MD 02/28/2015 12:46 PM    H&P    Expand All Collapse All   [name] [name] 02/28/2015 11:01 AM Location: [facility] Patient #: [id number] DOB: April 18, 1961 Married / Language: [name] / Race: White Female History of Present Illness Marcello Moores A. Antwion Carpenter MD; 02/28/2015 12:46 PM) Patient words: breast f/u   Pt returns after chemotherapy to discuss surgery. he has had a comlete response to chemotherapy. She still desires blateral mastectomy and nipple preservation. She does not want to participate in [facility] Trial and only wants SLN mapping. She will keep her port.         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 Biopsy proven metastatic lymph node within the left axilla, also biopsied on 10/08/2014. LABS: Not applicable EXAM: BILATERAL BREAST MRI WITH AND WITHOUT CONTRAST TECHNIQUE: Multiplanar, multisequence MR images of both breasts were obtained prior to and following the intravenous administration of 9 ml of MultiHance. THREE-DIMENSIONAL MR IMAGE RENDERING ON INDEPENDENT WORKSTATION: Three-dimensional MR images were rendered by post-processing of the original MR data on an independent workstation. The three-dimensional MR images were interpreted, and findings are reported in the following complete MRI report for this study. Three dimensional images were evaluated at the independent DynaCad workstation COMPARISON: Previous breast MRI dated 10/22/2014. Comparison is also made to diagnostic mammogram and ultrasound dated 10/03/2014 and ultrasound-guided biopsy dated 10/08/2014. FINDINGS: Breast composition: c. Heterogeneous fibroglandular tissue. Background parenchymal enhancement: Mild Right breast: There is no suspicious mass or non-mass  enhancement identified within the right breast. The presumed mass identified within the upper-outer quadrant of the right breast on the previous MRI is no longer visualized, suggesting it was merely background enhancement related to normal fibroglandular tissues. Simple cyst is again identified within the outer right breast, 9 o'clock axis, as also seen on earlier right breast ultrasound. Left breast: There is now no suspicious mass or non-mass enhancement identified within the left breast. The biopsy-proven carcinoma (irregular enhancing mass) previously seen within the upper-outer quadrant is no longer visualized. Only the biopsy clip artifact is visualized. There is now no evidence of multifocal or multicentric disease identified within the left breast. Lymph nodes: There are now no enlarged or morphologically abnormal lymph nodes identified within either axillary or internal mammary chain region. Incidental note is made of a benign tortuous/ectatic blood vessel adjacent to the right internal mammary chain. Ancillary findings: None. IMPRESSION: No residual mass or abnormal enhancement within the LEFT breast consistent with excellent response to interval treatment. No enlarged lymph nodes are now identified within the LEFT axilla, again consistent with good response to interval treatment. No evidence of malignancy within the RIGHT breast. RECOMMENDATION: Per treatment plan. BI-RADS CATEGORY 2: Benign. Biopsy clip artifact within the upper-outer quadrant of the left breast. Patient does have known biopsy-proven left breast invasive carcinoma, and biopsy-proven left axillary lymphadenopathy, status post good response to interval chemotherapy with no residual abnormality identified on today's MRI. Electronically Signed By: Franki Cabot M.D. 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 Biopsy proven metastatic lymph node within the left axilla, also biopsied  on 10/08/2014. LABS: Not applicable EXAM: BILATERAL BREAST MRI WITH AND WITHOUT CONTRAST TECHNIQUE: Multiplanar, multisequence MR images of both breasts were obtained prior to and following the intravenous administration of 9 ml of MultiHance. THREE-DIMENSIONAL MR IMAGE RENDERING ON INDEPENDENT WORKSTATION: Three-dimensional MR images were rendered by post-processing of the original MR data on an independent workstation. The three-dimensional MR images were interpreted, and findings are reported in the following complete MRI report for this study. Three dimensional images were evaluated at the independent DynaCad workstation COMPARISON: Previous breast MRI dated 10/22/2014. Comparison is also made to diagnostic mammogram and ultrasound dated 10/03/2014 and ultrasound-guided biopsy dated 10/08/2014. FINDINGS: Breast composition: c. Heterogeneous fibroglandular tissue. Background parenchymal enhancement: Mild Right breast: There is no suspicious mass or non-mass enhancement identified within the right breast. The presumed mass identified within the upper-outer quadrant of the right breast on the previous MRI is no longer visualized, suggesting it was merely background enhancement related to normal fibroglandular tissues. Simple cyst is again identified within the outer right breast, 9 o'clock axis, as also seen on earlier right breast ultrasound. Left breast: There is now no suspicious mass or non-mass enhancement identified within the left breast. The biopsy-proven carcinoma (irregular enhancing mass) previously seen within the upper-outer quadrant is no longer visualized. Only the biopsy clip artifact is visualized. There is now no evidence of multifocal or multicentric disease identified within the left breast. Lymph  nodes: There are now no enlarged or morphologically abnormal lymph nodes identified within either axillary or internal mammary chain region. Incidental note is made of a benign tortuous/ectatic blood vessel adjacent to the right internal mammary chain. Ancillary findings: None. IMPRESSION: No residual mass or abnormal enhancement within the LEFT breast consistent with excellent response to interval treatment. No enlarged lymph nodes are now identified within the LEFT axilla, again consistent with good response to interval treatment. No evidence of malignancy within the RIGHT breast. RECOMMENDATION: Per treatment plan. BI-RADS CATEGORY 2: Benign. Biopsy clip artifact within the upper-outer quadrant of the left breast. Patient does have known biopsy-proven left breast invasive carcinoma, and biopsy-proven left axillary lymphadenopathy, status post good response to interval chemotherapy with no residual abnormality identified on today's MRI. Electronically Signed By: Franki Cabot M.D. On: 02/17/2015 15:33   External Result Report External Result Report <[email or web address]  Imaging Imaging Information <[email or web address]>.  The patient is a 54 year old female   Allergies Marjean Donna, CMA; 02/28/2015 11:02 AM) Butorphanol Tartrate *CHEMICALS* Nausea, Vomiting. Meperidine-Promethazine *ANALGESICS - OPIOID* Nausea, Vomiting. Sulfa 10 *OPHTHALMIC AGENTS* Rash.  Medication History (Sonya Bynum, CMA; 02/28/2015 11:02 AM) Advil (100MG Tablet Chewable, Oral) Active. EMLA (2.5-2.5% Cream, External) Active. Ativan (0.5MG Tablet, Oral) Active. Zofran (8MG Tablet, Oral) Active. Paxil (20MG Tablet, Oral) Active. Compazine (10MG Tablet, Oral) Active. Calcium Carbonate (600MG Tablet, Oral) Active. Tylenol (325MG Tablet, Oral) Active. Goodys PM (38-500MG Packet, Oral) Active. Tenoretic 50 (50-25MG Tablet, Oral) Active. B Complex Vitamins (Oral) Active. Black  Cohosh Root Active. Decadron (4MG Tablet, Oral) Active. Relpax (40MG Tablet, Oral) Active. Medications Reconciled    Vitals (Sonya Bynum CMA; 02/28/2015 11:01 AM) 02/28/2015 11:01 AM Weight: 110 lb Height: 60in Body Surface Area: 1.45 m Body Mass Index: 21.48 kg/m Temp.: 98.71F(Temporal)  Pulse: 69 (Regular)  BP: 122/78 (Sitting, Left Arm, Standard)     Physical Exam (Lavilla Delamora A. Talen Poser MD; 02/28/2015 12:43 PM)  General Mental Status-Alert. General Appearance-Consistent with stated age. Hydration-Well hydrated. Voice-Normal.  Integumentary Note: alopecia   Head and Neck Head-normocephalic, atraumatic with no lesions or palpable  masses. Trachea-midline. Thyroid Gland Characteristics - normal size and consistency.  Chest and Lung Exam Chest and lung exam reveals -quiet, even and easy respiratory effort with no use of accessory muscles and on auscultation, normal breath sounds, no adventitious sounds and normal vocal resonance. Inspection Chest Wall - Normal. Back - normal.  Breast Breast - Left-Symmetric, Non Tender, No Biopsy scars, no Dimpling, No Inflammation, No Lumpectomy scars, No Mastectomy scars, No Peau d' Orange. Breast - Right-Symmetric, Non Tender, No Biopsy scars, no Dimpling, No Inflammation, No Lumpectomy scars, No Mastectomy scars, No Peau d' Orange. Breast Lump-No Palpable Breast Mass.  Cardiovascular Cardiovascular examination reveals -normal heart sounds, regular rate and rhythm with no murmurs and normal pedal pulses bilaterally.  Lymphatic Head & Neck  General Head & Neck Lymphatics: Bilateral - Description - Normal. Axillary  General Axillary Region: Bilateral - Description - Normal. Tenderness - Non Tender.    Assessment & Plan (Darlen Gledhill A. Lenell Mcconnell MD; 02/28/2015 12:41 PM)  BREAST CANCER, STAGE 2, LEFT (174.9  C50.912) Impression: Pt desires bilateral nipple sparing mastectomy and left SLN mapping. She  is not interested in the clinical trial after discussing for 30 minutes. She understands this is not standard of care and is ok with it. She is seeing Dr Harlow Mares soon. 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 The complication rate is doubled and she is aware. Discussed treatment options for breast cancer to include breast conservation vs mastectomy with reconstruction. Pt has decided on mastectomy. Risk include bleeding, infection, flap necrosis, pain, numbness, recurrence, hematoma, other surgery needs. Pt understands and agrees to proceed. Risk of sentinel lymph node mapping include bleeding, infection, lymphedema, shoulder pain. stiffness, dye allergy. cosmetic deformity , blood clots, death, need for more surgery. Pt agres to proceed.  Current Plans Pt Education - Patient information: Choosing treatment for early-stage breast cancer (The Basics): discussed with patient and provided information. Pt Education - Patient information: Sentinel lymph node biopsy for breast cancer (The Basics): discussed with patient and provided information. Pt Education - CCS Mastectomy HCI   The anatomy and the physiology was discussed. The pathophysiology and natural history of the disease was discussed. Options were discussed and recommendations were made. Technique, risks, benefits, & alternatives were discussed. Risks such as stroke, heart attack, bleeding, indection, death, and other risks discussed. Questions answered. The patient agrees to proceed. 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

## 2015-05-08 NOTE — Progress Notes (Signed)
1100: Pt arrived to infusion room in tears.  After speaking with pt, lots of family and personal issues leading to pt upset.  Offered chaplain services and pt in agreement, chaplain notified and arrived chairside to speak with pt.    1130: Pt declining tylenol and benadryl premeds.  Looking back on previous infusions pt did not receive these and pt states she tolerated infusion with no complications.  Will proceed with Herceptin infusion and continue to monitor pt.  1230:  Pt discharged with no complaints or concerns at this time.  Pt tolerated infusion with no complications.  Pt verbalizes feeling better emotionally and that chaplain services helped.  Pt notified to call us with any questions, concerns, or changes should they arise.

## 2015-05-08 NOTE — Patient Instructions (Signed)
[facility] Discharge Instructions for Patients Receiving Chemotherapy  Today you received the following chemotherapy agents: Herceptin   To help prevent nausea and vomiting after your treatment, we encourage you to take your nausea medication as directed.    If you develop nausea and vomiting that is not controlled by your nausea medication, call the clinic.   BELOW ARE SYMPTOMS THAT SHOULD BE REPORTED IMMEDIATELY:  *FEVER GREATER THAN 100.5 F  *CHILLS WITH OR WITHOUT FEVER  NAUSEA AND VOMITING THAT IS NOT CONTROLLED WITH YOUR NAUSEA MEDICATION  *UNUSUAL SHORTNESS OF BREATH  *UNUSUAL BRUISING OR BLEEDING  TENDERNESS IN MOUTH AND THROAT WITH OR WITHOUT PRESENCE OF ULCERS  *URINARY PROBLEMS  *BOWEL PROBLEMS  UNUSUAL RASH Items with * indicate a potential emergency and should be followed up as soon as possible.  Feel free to call the clinic you have any questions or concerns. The clinic phone number is [phone].  Please show the CHEMO ALERT CARD at check-in to the Emergency Department and triage nurse.   

## 2015-05-08 NOTE — Progress Notes (Signed)
Spiritual Care Note  Acquainted with Michaella from [facility].  Referred by Eulas Post, her infusion room RN, for spiritual and emotional support because pt was tearful with multiple stressors.  Pt is struggling with grief and guilt from the opportunity costs of her upcoming surgery--namely, that she won't be able to support her brother and SIL through SIL's critical illness (in [place]).  Amry reports that she herself has good support from family, friends, and Energy manager, but that a series of struggles and losses has overwhelmed her emotional reserve.  Served as a witness to her stories, normalizing feelings, providing encouragement and affirmation, helping her identify coping strategies (particularly for when she is confined to home following surgery), and offering prayer.  Her Catholic faith is very important to her meaning-making and coping.  She is aware of ongoing chaplain availability and prefers to reach out as desired.  Please also page as needs arise.  Thank you.  [place], [place], [facility] Pager [phone] Voicemail [phone]

## 2015-05-13 MED ORDER — DEXTROSE 5 % IV SOLN
3.0000 g | INTRAVENOUS | Status: DC
  Filled 2015-05-13: qty 3000

## 2015-05-13 NOTE — Anesthesia Preprocedure Evaluation (Deleted)
Anesthesia Evaluation  Patient identified by MRN, date of birth, ID band Patient awake    Reviewed: Allergy & Precautions, H&P , NPO status , Patient's Chart, lab work & pertinent test results, reviewed documented beta blocker date and time   Airway Mallampati: II  TM Distance: >3 FB Neck ROM: Full    Dental no notable dental hx. (+) Teeth Intact, Dental Advisory Given   Pulmonary neg pulmonary ROS,    Pulmonary exam normal breath sounds clear to auscultation       Cardiovascular hypertension, Pt. on home beta blockers and Pt. on medications negative cardio ROS   Rhythm:Regular Rate:Normal  7/16 ECHO: EF 55-60%, mild-mod MR   Neuro/Psych  Headaches, negative neurological ROS  negative psych ROS   GI/Hepatic negative GI ROS, Neg liver ROS,   Endo/Other  negative endocrine ROS  Renal/GU negative Renal ROS  negative genitourinary   Musculoskeletal   Abdominal   Peds  Hematology negative hematology ROS (+)   Anesthesia Other Findings Breast cancer: Chemo  Reproductive/Obstetrics negative OB ROS                          Anesthesia Physical Anesthesia Plan  ASA: II  Anesthesia Plan: General and Regional   Post-op Pain Management: GA combined w/ Regional for post-op pain   Induction: Intravenous  Airway Management Planned: Oral ETT  Additional Equipment:   Intra-op Plan:   Post-operative Plan: Extubation in OR  Informed Consent: I have reviewed the patients History and Physical, chart, labs and discussed the procedure including the risks, benefits and alternatives for the proposed anesthesia with the patient or authorized representative who has indicated his/her understanding and acceptance.   Dental advisory given  Plan Discussed with: CRNA  Anesthesia Plan Comments:         Anesthesia Quick Evaluation

## 2015-05-14 ENCOUNTER — Ambulatory Visit (HOSPITAL_COMMUNITY): Payer: BLUE CROSS/BLUE SHIELD

## 2015-05-14 ENCOUNTER — Ambulatory Visit (HOSPITAL_COMMUNITY)
Admission: RE | Admit: 2015-05-14 | Discharge: 2015-05-14 | Disposition: A | Discharge status: Home / Self Care | Payer: BLUE CROSS/BLUE SHIELD | Source: Ambulatory Visit | Attending: Surgery | Admitting: Surgery

## 2015-05-14 ENCOUNTER — Ambulatory Visit (HOSPITAL_COMMUNITY): Payer: BLUE CROSS/BLUE SHIELD | Admitting: Anesthesiology

## 2015-05-14 ENCOUNTER — Encounter (HOSPITAL_COMMUNITY)
Admission: RE | Disposition: A | Discharge status: Home / Self Care | Payer: Self-pay | Source: Ambulatory Visit | Attending: Surgery

## 2015-05-14 ENCOUNTER — Ambulatory Visit: Payer: Self-pay | Admitting: Surgery

## 2015-05-14 DIAGNOSIS — Z538 Procedure and treatment not carried out for other reasons: Secondary | ICD-10-CM | POA: Insufficient documentation

## 2015-05-14 DIAGNOSIS — C50912 Malignant neoplasm of unspecified site of left female breast: Secondary | ICD-10-CM

## 2015-05-14 DIAGNOSIS — C50112 Malignant neoplasm of central portion of left female breast: Secondary | ICD-10-CM

## 2015-05-14 DIAGNOSIS — C50111 Malignant neoplasm of central portion of right female breast: Secondary | ICD-10-CM

## 2015-05-14 DIAGNOSIS — Z170 Estrogen receptor positive status [ER+]: Secondary | ICD-10-CM | POA: Insufficient documentation

## 2015-05-14 DIAGNOSIS — E860 Dehydration: Secondary | ICD-10-CM

## 2015-05-14 DIAGNOSIS — N6091 Unspecified benign mammary dysplasia of right breast: Secondary | ICD-10-CM | POA: Insufficient documentation

## 2015-05-14 DIAGNOSIS — R197 Diarrhea, unspecified: Secondary | ICD-10-CM

## 2015-05-14 DIAGNOSIS — N62 Hypertrophy of breast: Secondary | ICD-10-CM | POA: Diagnosis present

## 2015-05-14 SURGERY — MASTECTOMY, NIPPLE SPARING
Anesthesia: Regional | Laterality: Bilateral

## 2015-05-14 MED ORDER — CHLORHEXIDINE GLUCONATE 4 % EX LIQD: 1.0000 "application " | Freq: Once | CUTANEOUS | Status: DC

## 2015-05-14 MED ORDER — HEPARIN SODIUM (PORCINE) 5000 UNIT/ML IJ SOLN
INTRAMUSCULAR | Status: AC
  Filled 2015-05-14: qty 1

## 2015-05-14 MED ORDER — PROPOFOL 10 MG/ML IV BOLUS
INTRAVENOUS | Status: AC
  Filled 2015-05-14: qty 20

## 2015-05-14 MED ORDER — MIDAZOLAM HCL 5 MG/5ML IJ SOLN
INTRAMUSCULAR | Status: DC | PRN
  Administered 2015-05-14: 1 mg via INTRAVENOUS

## 2015-05-14 MED ORDER — HEPARIN SODIUM (PORCINE) 5000 UNIT/ML IJ SOLN
5000.0000 [IU] | Freq: Once | INTRAMUSCULAR | Status: AC
  Administered 2015-05-14: 5000 [IU] via SUBCUTANEOUS

## 2015-05-14 MED ORDER — ROCURONIUM BROMIDE 50 MG/5ML IV SOLN
INTRAVENOUS | Status: AC
  Filled 2015-05-14: qty 1

## 2015-05-14 MED ORDER — DEXTROSE 5 % IV SOLN: 3.0000 g | INTRAVENOUS | Status: DC

## 2015-05-14 MED ORDER — MIDAZOLAM HCL 2 MG/2ML IJ SOLN
INTRAMUSCULAR | Status: AC
  Filled 2015-05-14: qty 2

## 2015-05-14 MED ORDER — SUCCINYLCHOLINE CHLORIDE 20 MG/ML IJ SOLN
INTRAMUSCULAR | Status: AC
Start: 2015-05-14 — End: 2015-05-14
  Filled 2015-05-14: qty 1

## 2015-05-14 MED ORDER — LACTATED RINGERS IV SOLN
INTRAVENOUS | Status: DC | PRN
  Administered 2015-05-14: 08:00:00 via INTRAVENOUS

## 2015-05-14 MED ORDER — DEXTROSE 5 % IV SOLN
3.0000 g | INTRAVENOUS | Status: DC
  Filled 2015-05-14: qty 3000

## 2015-05-14 MED ORDER — SODIUM CHLORIDE 0.9 % IV SOLN: INTRAVENOUS | Status: DC

## 2015-05-14 MED ORDER — FENTANYL CITRATE (PF) 250 MCG/5ML IJ SOLN
INTRAMUSCULAR | Status: AC
Start: 2015-05-14 — End: 2015-05-14
  Filled 2015-05-14: qty 5

## 2015-05-14 MED ORDER — CEFAZOLIN SODIUM-DEXTROSE 2-3 GM-% IV SOLR
2.0000 g | INTRAVENOUS | Status: DC
Start: 2015-05-14 — End: 2015-05-14

## 2015-05-14 MED ORDER — EPHEDRINE SULFATE 50 MG/ML IJ SOLN
INTRAMUSCULAR | Status: AC
Start: 2015-05-14 — End: 2015-05-14
  Filled 2015-05-14: qty 1

## 2015-05-14 MED ORDER — GENTAMICIN SULFATE 40 MG/ML IJ SOLN
Freq: Once | INTRAMUSCULAR | Status: DC
  Filled 2015-05-14: qty 1

## 2015-05-14 MED ORDER — FENTANYL CITRATE (PF) 100 MCG/2ML IJ SOLN
INTRAMUSCULAR | Status: DC | PRN
Start: 2015-05-14 — End: 2015-05-19
  Administered 2015-05-14: 50 ug via INTRAVENOUS

## 2015-05-14 MED ORDER — LIDOCAINE HCL (CARDIAC) 20 MG/ML IV SOLN
INTRAVENOUS | Status: AC
  Filled 2015-05-14: qty 5

## 2015-05-14 MED ORDER — BUPIVACAINE-EPINEPHRINE (PF) 0.5% -1:200000 IJ SOLN
INTRAMUSCULAR | Status: DC | PRN
  Administered 2015-05-14 (×2): 20 mL

## 2015-05-14 NOTE — H&P (Signed)
Dr Harlow Mares sick and cannot operate.  Will cancel and reschedule.  Pt and husband understand. Will reschedule. D/W Dr Harlow Mares.

## 2015-05-14 NOTE — Anesthesia Procedure Notes (Deleted)
Anesthesia Regional Block:  Pectoralis block  Pre-Anesthetic Checklist: ,, timeout performed, Correct Patient, Correct Site, Correct Laterality, Correct Procedure, Correct Position, site marked, Risks and benefits discussed, pre-op evaluation, post-op pain management  Laterality: Left and Right  Prep: Maximum Sterile Barrier Precautions used and chloraprep       Needles:  Injection technique: Single-shot  Needle Type: Echogenic Stimulator Needle     Needle Length: 9cm 9 cm Needle Gauge: 21 and 21 G    Additional Needles:  Procedures: ultrasound guided (picture in chart) Pectoralis block Narrative:  Start time: 05/14/2015 8:03 AM End time: 05/14/2015 8:23 AM Injection made incrementally with aspirations every 5 mL. Anesthesiologist: Roderic Palau  Additional Notes: 2% Lidocaine skin wheel. Bilateral PECS blocks.

## 2015-05-14 NOTE — Interval H&P Note (Signed)
History and Physical Interval Note:  05/14/2015 7:45 AM  [name] [name]  has presented today for surgery, with the diagnosis of Left Breast Cancer and Right Breast ADH  The various methods of treatment have been discussed with the patient and family. After consideration of risks, benefits and other options for treatment, the patient has consented to  Procedure(s): [place] ON RIGHT BREAST (Bilateral) IMMEDIATE BILATERAL BREAST RECONSTRUCTION WITH PLACEMENT OF IMPLANTS AND ACELLULAR HYDRATED MATRIX (Bilateral) as a surgical intervention .  The patient's history has been reviewed, patient examined, no change in status, stable for surgery.  I have reviewed the patient's chart and labs.  Questions were answered to the patient's satisfaction.     Nilton Lave A.

## 2015-05-16 ENCOUNTER — Other Ambulatory Visit (HOSPITAL_COMMUNITY): Payer: Self-pay | Admitting: Plastic Surgery

## 2015-05-16 ENCOUNTER — Encounter (HOSPITAL_COMMUNITY): Payer: Self-pay | Admitting: *Deleted

## 2015-05-18 MED ORDER — CEFAZOLIN SODIUM-DEXTROSE 2-3 GM-% IV SOLR
2.0000 g | INTRAVENOUS | Status: AC
  Administered 2015-05-19 (×2): 2 g via INTRAVENOUS
  Filled 2015-05-18: qty 50

## 2015-05-19 ENCOUNTER — Encounter (HOSPITAL_COMMUNITY): Payer: Self-pay | Admitting: *Deleted

## 2015-05-19 ENCOUNTER — Observation Stay (HOSPITAL_COMMUNITY)
Admission: RE | Admit: 2015-05-19 | Discharge: 2015-05-20 | Disposition: A | Discharge status: Home / Self Care | Payer: BLUE CROSS/BLUE SHIELD | Source: Ambulatory Visit | Attending: Surgery | Admitting: Surgery

## 2015-05-19 ENCOUNTER — Encounter (HOSPITAL_COMMUNITY)
Admission: RE | Disposition: A | Discharge status: Home / Self Care | Payer: Self-pay | Source: Ambulatory Visit | Attending: Surgery

## 2015-05-19 ENCOUNTER — Ambulatory Visit (HOSPITAL_COMMUNITY): Payer: BLUE CROSS/BLUE SHIELD

## 2015-05-19 ENCOUNTER — Encounter (HOSPITAL_COMMUNITY)
Admission: RE | Admit: 2015-05-19 | Discharge: 2015-05-19 | Disposition: A | Discharge status: Home / Self Care | Payer: BLUE CROSS/BLUE SHIELD | Source: Ambulatory Visit | Attending: Surgery | Admitting: Surgery

## 2015-05-19 DIAGNOSIS — Z791 Long term (current) use of non-steroidal anti-inflammatories (NSAID): Secondary | ICD-10-CM | POA: Insufficient documentation

## 2015-05-19 DIAGNOSIS — Z9221 Personal history of antineoplastic chemotherapy: Secondary | ICD-10-CM | POA: Diagnosis not present

## 2015-05-19 DIAGNOSIS — C50112 Malignant neoplasm of central portion of left female breast: Secondary | ICD-10-CM | POA: Diagnosis not present

## 2015-05-19 DIAGNOSIS — Z79899 Other long term (current) drug therapy: Secondary | ICD-10-CM | POA: Diagnosis not present

## 2015-05-19 DIAGNOSIS — N6091 Unspecified benign mammary dysplasia of right breast: Secondary | ICD-10-CM | POA: Diagnosis not present

## 2015-05-19 DIAGNOSIS — Z171 Estrogen receptor negative status [ER-]: Secondary | ICD-10-CM | POA: Insufficient documentation

## 2015-05-19 DIAGNOSIS — C50912 Malignant neoplasm of unspecified site of left female breast: Principal | ICD-10-CM | POA: Diagnosis present

## 2015-05-19 DIAGNOSIS — C773 Secondary and unspecified malignant neoplasm of axilla and upper limb lymph nodes: Secondary | ICD-10-CM | POA: Diagnosis not present

## 2015-05-19 DIAGNOSIS — I10 Essential (primary) hypertension: Secondary | ICD-10-CM | POA: Insufficient documentation

## 2015-05-19 DIAGNOSIS — C50919 Malignant neoplasm of unspecified site of unspecified female breast: Secondary | ICD-10-CM | POA: Diagnosis present

## 2015-05-19 HISTORY — PX: NIPPLE SPARING MASTECTOMY/SENTINAL LYMPH NODE BIOPSY/RECONSTRUCTION/PLACEMENT OF TISSUE EXPANDER: SHX6484

## 2015-05-19 HISTORY — PX: BREAST RECONSTRUCTION WITH PLACEMENT OF TISSUE EXPANDER AND FLEX HD (ACELLULAR HYDRATED DERMIS): SHX6295

## 2015-05-19 SURGERY — NIPPLE SPARING MASTECTOMY WITH SENTINAL LYMPH NODE BIOPSY AND  RECONSTRUCTION WITH PLACEMENT OF TISSUE EXPANDER
Anesthesia: Regional | Site: Breast | Laterality: Bilateral

## 2015-05-19 MED ORDER — MIDAZOLAM HCL 2 MG/2ML IJ SOLN
INTRAMUSCULAR | Status: AC
  Filled 2015-05-19: qty 2

## 2015-05-19 MED ORDER — DEXTROSE-NACL 5-0.45 % IV SOLN
INTRAVENOUS | Status: DC
  Administered 2015-05-19 – 2015-05-20 (×2): via INTRAVENOUS

## 2015-05-19 MED ORDER — PROMETHAZINE HCL 25 MG/ML IJ SOLN
INTRAMUSCULAR | Status: AC
  Administered 2015-05-19: 6.25 mg via INTRAVENOUS
  Filled 2015-05-19: qty 1

## 2015-05-19 MED ORDER — PAROXETINE HCL 20 MG PO TABS
20.0000 mg | ORAL_TABLET | Freq: Every day | ORAL | Status: DC
  Administered 2015-05-20: 20 mg via ORAL
  Filled 2015-05-19: qty 1

## 2015-05-19 MED ORDER — ENOXAPARIN SODIUM 40 MG/0.4ML ~~LOC~~ SOLN
40.0000 mg | SUBCUTANEOUS | Status: DC
  Administered 2015-05-20: 40 mg via SUBCUTANEOUS
  Filled 2015-05-19: qty 0.4

## 2015-05-19 MED ORDER — SODIUM CHLORIDE 0.9 % IJ SOLN
INTRAMUSCULAR | Status: DC | PRN
  Administered 2015-05-19: 5 mL via INTRAMUSCULAR

## 2015-05-19 MED ORDER — MIDAZOLAM HCL 2 MG/2ML IJ SOLN: 0.5000 mg | Freq: Once | INTRAMUSCULAR | Status: DC | PRN

## 2015-05-19 MED ORDER — GLYCOPYRROLATE 0.2 MG/ML IJ SOLN
INTRAMUSCULAR | Status: DC | PRN
  Administered 2015-05-19: .4 mg via INTRAVENOUS

## 2015-05-19 MED ORDER — DEXTROSE 5 % IV SOLN
3.0000 g | INTRAVENOUS | Status: DC
  Filled 2015-05-19: qty 3000

## 2015-05-19 MED ORDER — DIPHENHYDRAMINE HCL 50 MG/ML IJ SOLN: 25.0000 mg | Freq: Four times a day (QID) | INTRAMUSCULAR | Status: DC | PRN

## 2015-05-19 MED ORDER — METHOCARBAMOL 500 MG PO TABS
500.0000 mg | ORAL_TABLET | Freq: Four times a day (QID) | ORAL | Status: DC | PRN
Start: 2015-05-19 — End: 2015-05-20
  Administered 2015-05-20: 500 mg via ORAL
  Filled 2015-05-19: qty 1

## 2015-05-19 MED ORDER — HYDROMORPHONE HCL 1 MG/ML IJ SOLN
INTRAMUSCULAR | Status: AC
  Administered 2015-05-19: 0.5 mg via INTRAVENOUS
  Filled 2015-05-19: qty 1

## 2015-05-19 MED ORDER — CEFAZOLIN SODIUM-DEXTROSE 2-3 GM-% IV SOLR
INTRAVENOUS | Status: AC
  Filled 2015-05-19: qty 50

## 2015-05-19 MED ORDER — ACETAMINOPHEN 325 MG PO TABS
650.0000 mg | ORAL_TABLET | Freq: Four times a day (QID) | ORAL | Status: DC | PRN
Start: 2015-05-19 — End: 2015-05-20
  Administered 2015-05-20: 650 mg via ORAL
  Filled 2015-05-19: qty 2

## 2015-05-19 MED ORDER — DEXAMETHASONE SODIUM PHOSPHATE 10 MG/ML IJ SOLN
INTRAMUSCULAR | Status: DC | PRN
  Administered 2015-05-19: 4 mg via INTRAVENOUS

## 2015-05-19 MED ORDER — SODIUM CHLORIDE 0.9 % IJ SOLN
INTRAMUSCULAR | Status: AC
  Filled 2015-05-19: qty 10

## 2015-05-19 MED ORDER — SODIUM CHLORIDE 0.9 % IV SOLN
Freq: Once | INTRAVENOUS | Status: DC
  Filled 2015-05-19: qty 1

## 2015-05-19 MED ORDER — DOCUSATE SODIUM 100 MG PO CAPS
100.0000 mg | ORAL_CAPSULE | Freq: Two times a day (BID) | ORAL | Status: DC | PRN
  Administered 2015-05-20: 100 mg via ORAL
  Filled 2015-05-19: qty 1

## 2015-05-19 MED ORDER — HEPARIN SODIUM (PORCINE) 5000 UNIT/ML IJ SOLN
5000.0000 [IU] | Freq: Once | INTRAMUSCULAR | Status: AC
  Administered 2015-05-19: 5000 [IU] via SUBCUTANEOUS
  Filled 2015-05-19: qty 1

## 2015-05-19 MED ORDER — PHENYLEPHRINE HCL 10 MG/ML IJ SOLN
10.0000 mg | INTRAMUSCULAR | Status: DC | PRN
  Administered 2015-05-19: 10 ug/min via INTRAVENOUS

## 2015-05-19 MED ORDER — FENTANYL CITRATE (PF) 100 MCG/2ML IJ SOLN
INTRAMUSCULAR | Status: DC | PRN
  Administered 2015-05-19 (×5): 50 ug via INTRAVENOUS

## 2015-05-19 MED ORDER — DIPHENHYDRAMINE HCL 25 MG PO CAPS: 25.0000 mg | ORAL_CAPSULE | Freq: Four times a day (QID) | ORAL | Status: DC | PRN

## 2015-05-19 MED ORDER — SODIUM CHLORIDE 0.9 % IV SOLN
INTRAVENOUS | Status: DC | PRN
  Administered 2015-05-19: 1000 mL

## 2015-05-19 MED ORDER — OXYCODONE HCL 5 MG PO TABS
5.0000 mg | ORAL_TABLET | ORAL | Status: DC | PRN
  Administered 2015-05-19 – 2015-05-20 (×4): 10 mg via ORAL
  Filled 2015-05-19 (×4): qty 2

## 2015-05-19 MED ORDER — ATENOLOL 50 MG PO TABS
75.0000 mg | ORAL_TABLET | Freq: Every day | ORAL | Status: DC
  Filled 2015-05-19: qty 1

## 2015-05-19 MED ORDER — HYDROMORPHONE HCL 1 MG/ML IJ SOLN
0.2500 mg | INTRAMUSCULAR | Status: DC | PRN
  Administered 2015-05-19 (×3): 0.5 mg via INTRAVENOUS

## 2015-05-19 MED ORDER — PHENYLEPHRINE HCL 10 MG/ML IJ SOLN
INTRAMUSCULAR | Status: DC | PRN
  Administered 2015-05-19: 40 ug via INTRAVENOUS

## 2015-05-19 MED ORDER — HYDROMORPHONE HCL 2 MG PO TABS
2.0000 mg | ORAL_TABLET | ORAL | Status: DC | PRN
  Administered 2015-05-20: 4 mg via ORAL
  Filled 2015-05-19: qty 2

## 2015-05-19 MED ORDER — PROPOFOL 10 MG/ML IV BOLUS
INTRAVENOUS | Status: AC
  Filled 2015-05-19: qty 20

## 2015-05-19 MED ORDER — PROMETHAZINE HCL 25 MG/ML IJ SOLN
6.2500 mg | INTRAMUSCULAR | Status: DC | PRN
  Administered 2015-05-19: 6.25 mg via INTRAVENOUS
  Filled 2015-05-19: qty 1

## 2015-05-19 MED ORDER — CHLORHEXIDINE GLUCONATE 4 % EX LIQD: 1.0000 "application " | Freq: Once | CUTANEOUS | Status: DC

## 2015-05-19 MED ORDER — LACTATED RINGERS IV SOLN
INTRAVENOUS | Status: DC | PRN
  Administered 2015-05-19 (×2): via INTRAVENOUS

## 2015-05-19 MED ORDER — MIDAZOLAM HCL 5 MG/5ML IJ SOLN
INTRAMUSCULAR | Status: DC | PRN
  Administered 2015-05-19: 2 mg via INTRAVENOUS

## 2015-05-19 MED ORDER — NEOSTIGMINE METHYLSULFATE 10 MG/10ML IV SOLN
INTRAVENOUS | Status: DC | PRN
  Administered 2015-05-19: 3 mg via INTRAVENOUS

## 2015-05-19 MED ORDER — PROMETHAZINE HCL 25 MG/ML IJ SOLN
6.2500 mg | INTRAMUSCULAR | Status: DC | PRN
  Administered 2015-05-19: 6.25 mg via INTRAVENOUS

## 2015-05-19 MED ORDER — CEFAZOLIN SODIUM 1-5 GM-% IV SOLN
1.0000 g | Freq: Four times a day (QID) | INTRAVENOUS | Status: DC
  Administered 2015-05-19 – 2015-05-20 (×3): 1 g via INTRAVENOUS
  Filled 2015-05-19 (×5): qty 50

## 2015-05-19 MED ORDER — BUPIVACAINE-EPINEPHRINE (PF) 0.5% -1:200000 IJ SOLN
INTRAMUSCULAR | Status: DC | PRN
  Administered 2015-05-19 (×2): 20 mL

## 2015-05-19 MED ORDER — SCOPOLAMINE 1 MG/3DAYS TD PT72
MEDICATED_PATCH | TRANSDERMAL | Status: AC
  Administered 2015-05-19: 1 via TRANSDERMAL
  Filled 2015-05-19: qty 1

## 2015-05-19 MED ORDER — ZOLPIDEM TARTRATE 5 MG PO TABS: 5.0000 mg | ORAL_TABLET | Freq: Every evening | ORAL | Status: DC | PRN

## 2015-05-19 MED ORDER — TECHNETIUM TC 99M SULFUR COLLOID FILTERED: 1.0000 | Freq: Once | INTRAVENOUS | Status: DC | PRN

## 2015-05-19 MED ORDER — PROPOFOL 10 MG/ML IV BOLUS
INTRAVENOUS | Status: DC | PRN
  Administered 2015-05-19: 140 mg via INTRAVENOUS

## 2015-05-19 MED ORDER — FENTANYL CITRATE (PF) 250 MCG/5ML IJ SOLN
INTRAMUSCULAR | Status: AC
  Filled 2015-05-19: qty 5

## 2015-05-19 MED ORDER — ROCURONIUM BROMIDE 100 MG/10ML IV SOLN
INTRAVENOUS | Status: DC | PRN
  Administered 2015-05-19 (×4): 10 mg via INTRAVENOUS
  Administered 2015-05-19: 40 mg via INTRAVENOUS
  Administered 2015-05-19 (×3): 10 mg via INTRAVENOUS

## 2015-05-19 MED ORDER — LIDOCAINE HCL (CARDIAC) 20 MG/ML IV SOLN
INTRAVENOUS | Status: DC | PRN
  Administered 2015-05-19: 20 mg via INTRAVENOUS

## 2015-05-19 MED ORDER — METHYLENE BLUE 1 % INJ SOLN
INTRAMUSCULAR | Status: AC
  Filled 2015-05-19: qty 10

## 2015-05-19 SURGICAL SUPPLY — 93 items
ADH SKN CLS APL DERMABOND .7 (GAUZE/BANDAGES/DRESSINGS) ×2
APPLIER CLIP 9.375 MED OPEN (MISCELLANEOUS)
APR CLP MED 9.3 20 MLT OPN (MISCELLANEOUS)
ATCH SMKEVC FLXB CAUT HNDSWH (FILTER) ×2 IMPLANT
BAG DECANTER FOR FLEXI CONT (MISCELLANEOUS) ×3 IMPLANT
BINDER BREAST LRG (GAUZE/BANDAGES/DRESSINGS) ×1 IMPLANT
BINDER BREAST XLRG (GAUZE/BANDAGES/DRESSINGS) IMPLANT
BIOPATCH RED 1 DISK 7.0 (GAUZE/BANDAGES/DRESSINGS) ×6 IMPLANT
BLADE 10 SAFETY STRL DISP (BLADE) ×3 IMPLANT
CANISTER SUCTION 2500CC (MISCELLANEOUS) ×6 IMPLANT
CHLORAPREP W/TINT 26ML (MISCELLANEOUS) ×6 IMPLANT
CLIP APPLIE 9.375 MED OPEN (MISCELLANEOUS) IMPLANT
CONT SPEC 4OZ CLIKSEAL STRL BL (MISCELLANEOUS) ×5 IMPLANT
COVER PROBE W GEL 5X96 (DRAPES) ×3 IMPLANT
COVER SURGICAL LIGHT HANDLE (MISCELLANEOUS) ×6 IMPLANT
DERMABOND ADVANCED (GAUZE/BANDAGES/DRESSINGS) ×1
DERMABOND ADVANCED .7 DNX12 (GAUZE/BANDAGES/DRESSINGS) ×2 IMPLANT
DEVICE DISSECT PLASMABLAD 3.0S (MISCELLANEOUS) ×2 IMPLANT
DRAIN CHANNEL 19F RND (DRAIN) ×10 IMPLANT
DRAPE LAPAROSCOPIC ABDOMINAL (DRAPES) ×3 IMPLANT
DRAPE ORTHO SPLIT 77X108 STRL (DRAPES) ×6
DRAPE PROXIMA HALF (DRAPES) ×9 IMPLANT
DRAPE SURG 17X23 STRL (DRAPES) ×6 IMPLANT
DRAPE SURG ORHT 6 SPLT 77X108 (DRAPES) ×4 IMPLANT
DRAPE WARM FLUID 44X44 (DRAPE) ×3 IMPLANT
DRSG PAD ABDOMINAL 8X10 ST (GAUZE/BANDAGES/DRESSINGS) ×5 IMPLANT
DRSG SORBAVIEW 2.5X4 SM (GAUZE/BANDAGES/DRESSINGS) ×1 IMPLANT
DRSG SORBAVIEW 3.5X5-5/16 MED (GAUZE/BANDAGES/DRESSINGS) ×5 IMPLANT
ELECT BLADE 4.0 EZ CLEAN MEGAD (MISCELLANEOUS) ×3
ELECT BLADE 6.5 EXT (BLADE) ×3 IMPLANT
ELECT CAUTERY BLADE 6.4 (BLADE) ×6 IMPLANT
ELECT REM PT RETURN 9FT ADLT (ELECTROSURGICAL) ×9
ELECTRODE BLDE 4.0 EZ CLN MEGD (MISCELLANEOUS) ×2 IMPLANT
ELECTRODE REM PT RTRN 9FT ADLT (ELECTROSURGICAL) ×6 IMPLANT
EVACUATOR SILICONE 100CC (DRAIN) ×9 IMPLANT
EVACUATOR SMOKE ACCUVAC VALLEY (FILTER) ×1
GAUZE SPONGE 4X4 12PLY STRL (GAUZE/BANDAGES/DRESSINGS) ×3 IMPLANT
GLOVE BIO SURGEON STRL SZ7.5 (GLOVE) ×3 IMPLANT
GLOVE BIO SURGEON STRL SZ8 (GLOVE) ×3 IMPLANT
GLOVE BIOGEL PI IND STRL 6.5 (GLOVE) IMPLANT
GLOVE BIOGEL PI IND STRL 7.0 (GLOVE) IMPLANT
GLOVE BIOGEL PI IND STRL 8 (GLOVE) ×4 IMPLANT
GLOVE BIOGEL PI INDICATOR 6.5 (GLOVE) ×3
GLOVE BIOGEL PI INDICATOR 7.0 (GLOVE) ×1
GLOVE BIOGEL PI INDICATOR 8 (GLOVE) ×2
GLOVE ECLIPSE 6.5 STRL STRAW (GLOVE) ×1 IMPLANT
GLOVE SKINSENSE NS SZ7.0 (GLOVE) ×2
GLOVE SKINSENSE STRL SZ7.0 (GLOVE) IMPLANT
GLOVE SURG SS PI 7.0 STRL IVOR (GLOVE) ×2 IMPLANT
GOWN STRL REUS W/ TWL LRG LVL3 (GOWN DISPOSABLE) ×4 IMPLANT
GOWN STRL REUS W/ TWL XL LVL3 (GOWN DISPOSABLE) ×4 IMPLANT
GOWN STRL REUS W/TWL LRG LVL3 (GOWN DISPOSABLE) ×9
GOWN STRL REUS W/TWL XL LVL3 (GOWN DISPOSABLE) ×6
ILLUMINATOR WAVEGUIDE N/F (MISCELLANEOUS) ×1 IMPLANT
IMPL GEL 275CC SMOOTH HIGH (Breast) ×2 IMPLANT
IMPLANT GEL 275CC SMOOTH HIGH (Breast) ×6 IMPLANT
KIT BASIN OR (CUSTOM PROCEDURE TRAY) ×6 IMPLANT
KIT FLEXHD BILATERAL 8CM X 16C (Tissue Mesh) ×2 IMPLANT
KIT MARKER MARGIN INK (KITS) ×4 IMPLANT
KIT ROOM TURNOVER OR (KITS) ×5 IMPLANT
LIGHT WAVEGUIDE WIDE FLAT (MISCELLANEOUS) ×4 IMPLANT
MARKER SKIN DUAL TIP RULER LAB (MISCELLANEOUS) ×6 IMPLANT
NDL 18GX1X1/2 (RX/OR ONLY) (NEEDLE) IMPLANT
NDL HYPO 25GX1X1/2 BEV (NEEDLE) IMPLANT
NEEDLE 18GX1X1/2 (RX/OR ONLY) (NEEDLE) IMPLANT
NEEDLE HYPO 25GX1X1/2 BEV (NEEDLE) IMPLANT
NS IRRIG 1000ML POUR BTL (IV SOLUTION) ×9 IMPLANT
PACK GENERAL/GYN (CUSTOM PROCEDURE TRAY) ×6 IMPLANT
PAD ARMBOARD 7.5X6 YLW CONV (MISCELLANEOUS) ×6 IMPLANT
PIN SAFETY STERILE (MISCELLANEOUS) ×3 IMPLANT
PLASMABLADE 3.0S (MISCELLANEOUS)
PREFILTER EVAC NS 1 1/3-3/8IN (MISCELLANEOUS) ×3 IMPLANT
SIZER BREAST 325CC (SIZER) ×3
SIZER BRST P3.8-4.5XMDRT325CC (SIZER) IMPLANT
SPECIMEN JAR X LARGE (MISCELLANEOUS) ×3 IMPLANT
SPONGE LAP 18X18 X RAY DECT (DISPOSABLE) ×4 IMPLANT
STAPLER VISISTAT 35W (STAPLE) ×4 IMPLANT
STRIP CLOSURE SKIN 1/2X4 (GAUZE/BANDAGES/DRESSINGS) ×3 IMPLANT
SUT ETHILON 2 0 FS 18 (SUTURE) ×3 IMPLANT
SUT MNCRL AB 3-0 PS2 18 (SUTURE) ×16 IMPLANT
SUT MNCRL AB 4-0 PS2 18 (SUTURE) ×4 IMPLANT
SUT PDS AB 3-0 SH 27 (SUTURE) ×8 IMPLANT
SUT PROLENE 3 0 PS 2 (SUTURE) ×6 IMPLANT
SUT SILK 2 0 SH (SUTURE) IMPLANT
SUT VIC AB 3-0 54X BRD REEL (SUTURE) ×2 IMPLANT
SUT VIC AB 3-0 BRD 54 (SUTURE)
SUT VIC AB 3-0 SH 18 (SUTURE) ×12 IMPLANT
SYR BULB IRRIGATION 50ML (SYRINGE) ×3 IMPLANT
SYR CONTROL 10ML LL (SYRINGE) ×2 IMPLANT
TOWEL OR 17X24 6PK STRL BLUE (TOWEL DISPOSABLE) ×6 IMPLANT
TOWEL OR 17X26 10 PK STRL BLUE (TOWEL DISPOSABLE) ×6 IMPLANT
TRAY FOLEY CATH 16FR SILVER (SET/KITS/TRAYS/PACK) ×1 IMPLANT
TUBE CONNECTING 12X1/4 (SUCTIONS) ×6 IMPLANT

## 2015-05-19 NOTE — Op Note (Deleted)
[name], [name]                ACCOUNT NO.:  [id number]  MEDICAL RECORD NO.:  [id number]  LOCATION:  [facility]                          FACILITY:  [facility]  PHYSICIAN:  Crissie Reese, M.D.     DATE OF BIRTH:  1960-11-09  DATE OF PROCEDURE:  05/19/2015 DATE OF DISCHARGE:                              OPERATIVE REPORT   PREOPERATIVE DIAGNOSIS:  Left breast cancer.  POSTOPERATIVE DIAGNOSIS:  Left breast cancer.  PROCEDURES PERFORMED: 1. Left immediate breast reconstruction with silicone gel implant. 2. Distinct procedure, reconstruction of chest wall and reconstitution     of inframammary fold with acellular dermal matrix, left chest for     inadequate muscle and to reestablish inframammary crease, total     surface area is greater than 100 cm2. 3. Right immediate breast reconstruction with silicone gel implant. 4. Distinct procedure, reconstruction of right chest wall for     inadequate muscle and to reestablish inframammary crease, total of     80 cm2.  SURGEON:  Crissie Reese, M.D.  ASSISTANT:  Judyann Munson, RNFA.  ANESTHESIA:  General.  CLINICAL NOTE:  A 54 year old woman, has left breast cancer and has already had neoadjuvant chemotherapy.  She presents today for bilateral mastectomy.  She elected to have nipple-sparing mastectomy.  She discussed this with her general surgeon, who felt that she was a good candidate.  She desired immediate reconstruction with silicone gel implants.  She understood that it may be necessary to place tissue expander initially, but then every effort be made to go direct to the implant.  She understood that this procedure would require the use of acellular dermal matrix and she understood the nature of that material and the rationale for its use.  Significantly, she will need to have postoperative radiation treatment on the left side.  This was discussed with her at length on several separate occasions.  She understood that an option would be to  hold off on any reconstruction for the present time and do a delayed breast reconstruction after radiation.  After extensive discussion, she elected to go ahead with her reconstruction at this time with the understand that radiation would cause changes on the left side including, but not limited to radiation fibrosis, capsular contracture, and the possibility of loss of her reconstruction as well as full-thickness tissue damage.  The risks and possible complications were discussed with her regarding the surgery, but not limited to bleeding, infection, healing problems, scarring, loss of sensation, fluid accumulations, anesthesia-related complications, pneumothorax, DVT, PE, failure of devices, capsular contracture, displacement of devices, wrinkles and ripples, asymmetry, chronic pain, and loss of sensation in nipples, loss of tissue, loss of nipples and overall disappointment.  She understood all this and wished to proceed.  DESCRIPTION OF PROCEDURE:  The patient was in the operating room and bilateral nipple-sparing mastectomy completed.  The flaps and nipples were inspected and found to have excellent color and all appeared to be viable.  Thorough irrigation with saline as well as antibiotic solution. The pectoralis muscles were lifted.  Great care was taken throughout the procedure to avoid damage to underlying chest cavity.  After creating submuscular space, the Sizer placed on the  left side, 325 mL and found to be too large.  It was decided that 275 mL implant would be appropriate.  The acellular dermal matrix was prepared on both sides. It was soaked in first saline for greater than 10 minutes in antibiotic solution.  It was stripped of all fluid and then rinsed as well with antibiotic solution.  After thoroughly cleaning gloves, the acellular dermal matrix was then positioned.  It was secured with 3-0 PDS in a simple interrupted sutures.  The implants were soaked in  antibiotic solution again greater than 5 minutes.  These were [employer]or Smooth round high-profile silicone gel implants.  These were positioned, redundant acellular dermal matrix was removed and closure was then completed with 3-0 PDS simple interrupted sutures.  Great care was taken to avoid damage to underlying implants, which were kept under direct vision at all times.  A small additional patch was needed at the inferomedial aspect on the left side.  Again, great care was taken to avoid damage to underlying implant.  Irrigation with saline.  Again, hemostasis of the mastectomy flaps with electrocautery.  The mastectomy flaps continued to have good color.  Two 19-French drains were positioned on each side. One was brought out medial aspect on each side and another was brought out lateral on each side.  Total of four drains.  These were secured with 3-0 Prolene sutures.  The superior border of the incisions was then trimmed with bright red bleeding consistent with viability.  The closures with 3-0 Monocryl interrupted inverted deep dermal sutures followed by Dermabond.  SorbaView with BioPatches for the drains and then dry sterile dressings and the chest vest positioned and she tolerated the procedure well.  DISPOSITION:  She will be admitted to the hospital.     Crissie Reese, M.D.     DB/MEDQ  D:  05/19/2015  T:  05/19/2015  Job:  [id number]

## 2015-05-19 NOTE — Interval H&P Note (Signed)
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 After consideration of risks, benefits and other options for treatment, the patient has consented to  Procedure(s): BILATERAL NIPPLE SPARING MASTECTOMY WITH LEFT AXILLARY SENTINAL LYMPH NODE BIOPSY (Bilateral) IMMEDIATE BILATERAL BREAST RECONSTRUCTION WITH IMPLANTS  AND ADM (ACELLULAR DERMA MATRIX) (Bilateral) as [name] surgical intervention .  The patient's history has been reviewed, patient examined, no change in status, stable for surgery.  I have reviewed the patient's chart and labs.  Questions were answered to the patient's satisfaction.     [name] [name].

## 2015-05-19 NOTE — Brief Op Note (Signed)
05/14/2015 - 05/19/2015  10:18 AM  PATIENT:  [name] [name]  54 y.o. female  PRE-OPERATIVE DIAGNOSIS:  left breast cancer right atypical ductal hyperplasia  POST-OPERATIVE DIAGNOSIS:  left breast cancer, right atypical ductal hyperplasia  PROCEDURE:  Procedure(s): BILATERAL NIPPLE SPARING MASTECTOMY WITH LEFT AXILLARY SENTINAL LYMPH NODE BIOPSY (Bilateral) IMMEDIATE BILATERAL BREAST RECONSTRUCTION WITH IMPLANTS  AND ADM (ACELLULAR DERMA MATRIX) (Bilateral)  SURGEON:  Surgeon(s) and Role: Panel 1:    * Erroll Luna, MD - Primary  Panel 2:    * Crissie Reese, MD - Primary     ASSISTANTS: none   ANESTHESIA:   general and pectoral block left  EBL:  Total I/O In: 1000 [I.V.:1000] Out: 250 [Urine:200; Blood:50]  BLOOD ADMINISTERED:none  DRAINS: none   LOCAL MEDICATIONS USED:  NONE  SPECIMEN:  Source of Specimen:  bilateral breast  left axillary SLN   DISPOSITION OF SPECIMEN:  PATHOLOGY  COUNTS:  YES  TOURNIQUET:  * No tourniquets in log *  DICTATION: .Other Dictation: Dictation Number  [id number]  PLAN OF CARE: Admit to inpatient   PATIENT DISPOSITION:  PACU - hemodynamically stable.   Delay start of Pharmacological VTE agent (>24hrs) due to surgical blood loss or risk of bleeding: no

## 2015-05-19 NOTE — Anesthesia Procedure Notes (Addendum)
Procedure Name: Intubation Date/Time: 05/19/2015 7:50 AM Performed by: Tressia Miners LEFFEW Pre-anesthesia Checklist: Patient identified, Patient being monitored, Timeout performed, Emergency Drugs available and Suction available Patient Re-evaluated:Patient Re-evaluated prior to inductionOxygen Delivery Method: Circle System Utilized Preoxygenation: Pre-oxygenation with 100% oxygen Intubation Type: IV induction Ventilation: Mask ventilation without difficulty Laryngoscope Size: Mac and 3 Grade View: Grade I Tube type: Oral Tube size: 7.0 mm Number of attempts: 1 Airway Equipment and Method: Stylet Placement Confirmation: ETT inserted through vocal cords under direct vision,  positive ETCO2 and breath sounds checked- equal and bilateral Secured at: 22 cm Tube secured with: Tape Dental Injury: Teeth and Oropharynx as per pre-operative assessment    Anesthesia Regional Block:  Pectoralis block  Pre-Anesthetic Checklist: ,, timeout performed, Correct Patient, Correct Site, Correct Laterality, Correct Procedure, Correct Position, site marked, Risks and benefits discussed,  Surgical consent,  Pre-op evaluation,  At surgeon's request and post-op pain management  Laterality: Left  Prep: chloraprep       Needles:  Injection technique: Single-shot     Needle Length: 9cm 9 cm Needle Gauge: 22 and 22 G    Additional Needles:  Procedures: ultrasound guided (picture in chart) Pectoralis block Narrative:  Start time: 05/19/2015 7:14 AM End time: 05/19/2015 7:24 AM Injection made incrementally with aspirations every 5 mL.  Performed by: Personally  Anesthesiologist: Glennon Mac, Yoali Conry  Additional Notes: Pt identified in Holding room.  Monitors applied. Working IV access confirmed. Sterile prep and drape L clavicle and pec. #22ga ECHOgenic needle sub pec between ribs 4,5 with US guidance.  20cc 0.5% Bupivacaine with 1:200k epi injected incrementally after negative test dose, good  spread of local.  Patient asymptomatic, VSS, no heme aspirated, tolerated well.     Anesthesia Regional Block:  Pectoralis block  Pre-Anesthetic Checklist: ,, timeout performed, Correct Patient, Correct Site, Correct Laterality, Correct Procedure, Correct Position, site marked, Risks and benefits discussed,  Surgical consent,  Pre-op evaluation,  At surgeon's request and post-op pain management  Laterality: Right  Prep: chloraprep       Needles:  Injection technique: Single-shot  Needle Type: Echogenic Needle     Needle Length: 9cm 9 cm Needle Gauge: 22 and 22 G    Additional Needles:  Procedures: ultrasound guided (picture in chart) Pectoralis block Narrative:  Start time: 05/19/2015 7:24 AM End time: 05/19/2015 7:30 AM Injection made incrementally with aspirations every 5 mL.  Performed by: Personally  Anesthesiologist: Glennon Mac, Maison Kestenbaum  Additional Notes: Pt identified in Holding room.  Monitors applied. Working IV access confirmed. Sterile prep, drape R clavicle and pec.  #22ga ECHOgenic needle sub-pec between ribs 4,5 with US guidance.  20cc 0.5% Bupivacaine with 1:200k epi injected incrementally after negative test dose, with good spread of local.  Patient asymptomatic, VSS, no heme aspirated, tolerated well.

## 2015-05-19 NOTE — Anesthesia Preprocedure Evaluation (Signed)
Anesthesia Evaluation  Patient identified by MRN, date of birth, ID band Patient awake    Reviewed: Allergy & Precautions, NPO status , Patient's Chart, lab work & pertinent test results, reviewed documented beta blocker date and time   History of Anesthesia Complications Negative for: history of anesthetic complications  Airway Mallampati: I  TM Distance: >3 FB Neck ROM: Full    Dental  (+) Dental Advisory Given, Teeth Intact   Pulmonary neg pulmonary ROS,    breath sounds clear to auscultation       Cardiovascular hypertension, Pt. on home beta blockers  Rhythm:Regular Rate:Normal  7/16 ECHO: EF 55-60%, mild-mod MR   Neuro/Psych negative neurological ROS     GI/Hepatic negative GI ROS, Neg liver ROS,   Endo/Other  negative endocrine ROS  Renal/GU negative Renal ROS     Musculoskeletal   Abdominal   Peds  Hematology negative hematology ROS (+)   Anesthesia Other Findings Breast cancer: chemo  Reproductive/Obstetrics                             Anesthesia Physical Anesthesia Plan  ASA: II  Anesthesia Plan: General   Post-op Pain Management: GA combined w/ Regional for post-op pain   Induction: Intravenous  Airway Management Planned: Oral ETT  Additional Equipment:   Intra-op Plan:   Post-operative Plan: Extubation in OR  Informed Consent: I have reviewed the patients History and Physical, chart, labs and discussed the procedure including the risks, benefits and alternatives for the proposed anesthesia with the patient or authorized representative who has indicated his/her understanding and acceptance.   Dental advisory given  Plan Discussed with: CRNA and Surgeon  Anesthesia Plan Comments: (Plan routine monitors, GETA with bilateral pec blocks for post op analgesia)        Anesthesia Quick Evaluation

## 2015-05-19 NOTE — H&P (View-Only) (Signed)
H&P   [name] [name] (MR# [id number])      H&P Info    Author Note Status Last Update User Last Update Date/Time   Natasa Stigall, MD Signed Zamiya Dillard, MD 02/28/2015 12:46 PM    H&P    Expand All Collapse All   [name] [name] 02/28/2015 11:01 AM Location: [facility] Patient #: [id number] DOB: 10/16/1960 Married / Language: English / Race: White Female History of Present Illness ([name] Hengel A. Ajaya Crutchfield MD; 02/28/2015 12:46 PM) Patient words: breast f/u   Pt returns after chemotherapy to discuss surgery. he has had a comlete response to chemotherapy. She still desires blateral mastectomy and nipple preservation. She does not want to participate in Alliance Trial and only wants SLN mapping. She will keep her port.         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 Biopsy proven metastatic lymph node within the left axilla, also biopsied on 10/08/2014. LABS: Not applicable EXAM: BILATERAL BREAST MRI WITH AND WITHOUT CONTRAST TECHNIQUE: Multiplanar, multisequence MR images of both breasts were obtained prior to and following the intravenous administration of 9 ml of MultiHance. THREE-DIMENSIONAL MR IMAGE RENDERING ON INDEPENDENT WORKSTATION: Three-dimensional MR images were rendered by post-processing of the original MR data on an independent workstation. The three-dimensional MR images were interpreted, and findings are reported in the following complete MRI report for this study. Three dimensional images were evaluated at the independent DynaCad workstation COMPARISON: Previous breast MRI dated 10/22/2014. Comparison is also made to diagnostic mammogram and ultrasound dated 10/03/2014 and ultrasound-guided biopsy dated 10/08/2014. FINDINGS: Breast composition: c. Heterogeneous fibroglandular tissue. Background parenchymal enhancement: Mild Right breast: There is no suspicious mass or non-mass  enhancement identified within the right breast. The presumed mass identified within the upper-outer quadrant of the right breast on the previous MRI is no longer visualized, suggesting it was merely background enhancement related to normal fibroglandular tissues. Simple cyst is again identified within the outer right breast, 9 o'clock axis, as also seen on earlier right breast ultrasound. Left breast: There is now no suspicious mass or non-mass enhancement identified within the left breast. The biopsy-proven carcinoma (irregular enhancing mass) previously seen within the upper-outer quadrant is no longer visualized. Only the biopsy clip artifact is visualized. There is now no evidence of multifocal or multicentric disease identified within the left breast. Lymph nodes: There are now no enlarged or morphologically abnormal lymph nodes identified within either axillary or internal mammary chain region. Incidental note is made of a benign tortuous/ectatic blood vessel adjacent to the right internal mammary chain. Ancillary findings: None. IMPRESSION: No residual mass or abnormal enhancement within the LEFT breast consistent with excellent response to interval treatment. No enlarged lymph nodes are now identified within the LEFT axilla, again consistent with good response to interval treatment. No evidence of malignancy within the RIGHT breast. RECOMMENDATION: Per treatment plan. BI-RADS CATEGORY 2: Benign. Biopsy clip artifact within the upper-outer quadrant of the left breast. Patient does have known biopsy-proven left breast invasive carcinoma, and biopsy-proven left axillary lymphadenopathy, status post good response to interval chemotherapy with no residual abnormality identified on today's MRI. Electronically Signed By: Stan Maynard M.D. 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 Biopsy proven metastatic lymph node within the left axilla, also biopsied   on 10/08/2014. LABS: Not applicable EXAM: BILATERAL BREAST MRI WITH AND WITHOUT CONTRAST TECHNIQUE: Multiplanar, multisequence MR images of both breasts were obtained prior to and following the intravenous administration of 9 ml of MultiHance. THREE-DIMENSIONAL MR IMAGE RENDERING ON INDEPENDENT WORKSTATION: Three-dimensional MR images were rendered by post-processing of the original MR data on an independent workstation. The three-dimensional MR images were interpreted, and findings are reported in the following complete MRI report for this study. Three dimensional images were evaluated at the independent DynaCad workstation COMPARISON: Previous breast MRI dated 10/22/2014. Comparison is also made to diagnostic mammogram and ultrasound dated 10/03/2014 and ultrasound-guided biopsy dated 10/08/2014. FINDINGS: Breast composition: c. Heterogeneous fibroglandular tissue. Background parenchymal enhancement: Mild Right breast: There is no suspicious mass or non-mass enhancement identified within the right breast. The presumed mass identified within the upper-outer quadrant of the right breast on the previous MRI is no longer visualized, suggesting it was merely background enhancement related to normal fibroglandular tissues. Simple cyst is again identified within the outer right breast, 9 o'clock axis, as also seen on earlier right breast ultrasound. Left breast: There is now no suspicious mass or non-mass enhancement identified within the left breast. The biopsy-proven carcinoma (irregular enhancing mass) previously seen within the upper-outer quadrant is no longer visualized. Only the biopsy clip artifact is visualized. There is now no evidence of multifocal or multicentric disease identified within the left breast. Lymph  nodes: There are now no enlarged or morphologically abnormal lymph nodes identified within either axillary or internal mammary chain region. Incidental note is made of a benign tortuous/ectatic blood vessel adjacent to the right internal mammary chain. Ancillary findings: None. IMPRESSION: No residual mass or abnormal enhancement within the LEFT breast consistent with excellent response to interval treatment. No enlarged lymph nodes are now identified within the LEFT axilla, again consistent with good response to interval treatment. No evidence of malignancy within the RIGHT breast. RECOMMENDATION: Per treatment plan. BI-RADS CATEGORY 2: Benign. Biopsy clip artifact within the upper-outer quadrant of the left breast. Patient does have known biopsy-proven left breast invasive carcinoma, and biopsy-proven left axillary lymphadenopathy, status post good response to interval chemotherapy with no residual abnormality identified on today's MRI. Electronically Signed By: Franki Cabot M.D. On: 02/17/2015 15:33   External Result Report External Result Report <[email or web address]  Imaging Imaging Information <[email or web address]>.  The patient is a 54 year old female   Allergies Marjean Donna, CMA; 02/28/2015 11:02 AM) Butorphanol Tartrate *CHEMICALS* Nausea, Vomiting. Meperidine-Promethazine *ANALGESICS - OPIOID* Nausea, Vomiting. Sulfa 10 *OPHTHALMIC AGENTS* Rash.  Medication History (Sonya Bynum, CMA; 02/28/2015 11:02 AM) Advil (100MG Tablet Chewable, Oral) Active. EMLA (2.5-2.5% Cream, External) Active. Ativan (0.5MG Tablet, Oral) Active. Zofran (8MG Tablet, Oral) Active. Paxil (20MG Tablet, Oral) Active. Compazine (10MG Tablet, Oral) Active. Calcium Carbonate (600MG Tablet, Oral) Active. Tylenol (325MG Tablet, Oral) Active. Goodys PM (38-500MG Packet, Oral) Active. Tenoretic 50 (50-25MG Tablet, Oral) Active. B Complex Vitamins (Oral) Active. Black  Cohosh Root Active. Decadron (4MG Tablet, Oral) Active. Relpax (40MG Tablet, Oral) Active. Medications Reconciled    Vitals (Sonya Bynum CMA; 02/28/2015 11:01 AM) 02/28/2015 11:01 AM Weight: 110 lb Height: 60in Body Surface Area: 1.45 m Body Mass Index: 21.48 kg/m Temp.: 98.71F(Temporal)  Pulse: 69 (Regular)  BP: 122/78 (Sitting, Left Arm, Standard)     Physical Exam (Catharina Pica A. Avelina Mcclurkin MD; 02/28/2015 12:43 PM)  General Mental Status-Alert. General Appearance-Consistent with stated age. Hydration-Well hydrated. Voice-Normal.  Integumentary Note: alopecia   Head and Neck Head-normocephalic, atraumatic with no lesions or palpable  masses. Trachea-midline. Thyroid Gland Characteristics - normal size and consistency.  Chest and Lung Exam Chest and lung exam reveals -quiet, even and easy respiratory effort with no use of accessory muscles and on auscultation, normal breath sounds, no adventitious sounds and normal vocal resonance. Inspection Chest Wall - Normal. Back - normal.  Breast Breast - Left-Symmetric, Non Tender, No Biopsy scars, no Dimpling, No Inflammation, No Lumpectomy scars, No Mastectomy scars, No Peau d' Orange. Breast - Right-Symmetric, Non Tender, No Biopsy scars, no Dimpling, No Inflammation, No Lumpectomy scars, No Mastectomy scars, No Peau d' Orange. Breast Lump-No Palpable Breast Mass.  Cardiovascular Cardiovascular examination reveals -normal heart sounds, regular rate and rhythm with no murmurs and normal pedal pulses bilaterally.  Lymphatic Head & Neck  General Head & Neck Lymphatics: Bilateral - Description - Normal. Axillary  General Axillary Region: Bilateral - Description - Normal. Tenderness - Non Tender.    Assessment & Plan (Jejuan Scala A. Betzabeth Derringer MD; 02/28/2015 12:41 PM)  BREAST CANCER, STAGE 2, LEFT (174.9  C50.912) Impression: Pt desires bilateral nipple sparing mastectomy and left SLN mapping. She  is not interested in the clinical trial after discussing for 30 minutes. She understands this is not standard of care and is ok with it. She is seeing Dr Harlow Mares soon. 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 The complication rate is doubled and she is aware. Discussed treatment options for breast cancer to include breast conservation vs mastectomy with reconstruction. Pt has decided on mastectomy. Risk include bleeding, infection, flap necrosis, pain, numbness, recurrence, hematoma, other surgery needs. Pt understands and agrees to proceed. Risk of sentinel lymph node mapping include bleeding, infection, lymphedema, shoulder pain. stiffness, dye allergy. cosmetic deformity , blood clots, death, need for more surgery. Pt agres to proceed.  Current Plans Pt Education - Patient information: Choosing treatment for early-stage breast cancer (The Basics): discussed with patient and provided information. Pt Education - Patient information: Sentinel lymph node biopsy for breast cancer (The Basics): discussed with patient and provided information. Pt Education - CCS Mastectomy HCI   The anatomy and the physiology was discussed. The pathophysiology and natural history of the disease was discussed. Options were discussed and recommendations were made. Technique, risks, benefits, & alternatives were discussed. Risks such as stroke, heart attack, bleeding, indection, death, and other risks discussed. Questions answered. The patient agrees to proceed. 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

## 2015-05-19 NOTE — H&P (View-Only) (Signed)
Dr Harlow Mares sick and cannot operate.  Will cancel and reschedule.  Pt and husband understand. Will reschedule. D/W Dr Harlow Mares.

## 2015-05-19 NOTE — Op Note (Signed)
NAMEDEKLYNN, CHARLET                ACCOUNT NO.:  [id number]  MEDICAL RECORD NO.:  [id number]  LOCATION:  [id number]                        FACILITY:  [facility]  PHYSICIAN:  Marcello Moores A. Torianne Laflam, M.D.DATE OF BIRTH:  1960/12/14  DATE OF PROCEDURE:  05/19/2015 DATE OF DISCHARGE:                              OPERATIVE REPORT   PREOPERATIVE DIAGNOSIS:  Stage II left breast cancer.  POSTOPERATIVE DIAGNOSIS:  Stage II left breast cancer.  PROCEDURES: 1. Left nipple-sparing mastectomy with left axillary sentinel lymph     node mapping. 2. Right prophylactic nipple-sparing mastectomy.  SURGEON:  Marcello Moores A. Vyla Pint, M.D.  ANESTHESIA:  General endotracheal anesthesia with left pectoral block.  EBL:  About 100 mL.  SPECIMENS: 1. Right breast. 2. Additional right anterior margin. 3. Left breast. 4. Left axillary sentinel nodes x2. 5. Posterior aspect of right nipple. 6. Posterior aspect of left nipple.  IV FLUIDS:  1.5 L crystalloid.  INDICATIONS FOR PROCEDURE:  The patient is a 54 year old female with stage II left breast cancer.  She underwent neoadjuvant chemotherapy. She had significant reduction in size and had good response overall.  We discussed the options of lumpectomy versus mastectomy.  She was very small breasted and a lumpectomy with least severe cosmetic defects in the left side especially since she required postop radiation therapy. She had very dense, small right breast as well.  We had a long discussion about options that she met with Dr. Harlow Mares of Plastic Surgery as well.  After lengthy discussion of OR treatment options in this setting and the understood postoperative treatment course, she elected to undergo bilateral nipple-sparing mastectomy with the right being prophylactic since she had a very dense, hard breast.  She understood she would need postop radiation therapy that could compromise her cosmetic appearance, but she was understood all this and agreed to proceed.   She also understood the need for sentinel lymph node.  She did have a positive node preop and we discussed the Alliance clinical trial with her, but she had no interest in it.  She also understood she may require an eventual left axillary node dissection depending on what her sentinel node showed.  She could also give postop radiation therapy of the left axilla and enrolled new Alliance clinical trial postop and she understood that option as well.  We discussed the risk of surgery with her at great length to include, but not exclusive of, bleeding, infection, nerve injury, shoulder stiffness, lymphedema, loss of nipple, the need for more surgery, loss of implants, pain and cosmetic expectations were sent with her preoperatively with the expectation of possible flap necrosis as well.  She agreed to proceed.  DESCRIPTION OF PROCEDURE:  The patient was met in the holding area.  She underwent injection of the left nipple-areolar complex with technetium sulfur colloid for mapping.  She was seen and the anesthesia site was marked as sentinel node site.  Questions were answered.  She was taken back to the operating room and placed supine on the OR table.  After induction of general anesthesia, the upper chest regions were prepped and draped in a sterile fashion.  Time-out was done.  The 4 mL of methylene blue  dye were injected in the left subareolar position and a massage was done.  The right side was done first.  Curvilinear incision was made along the inframammary crease.  The breast was dissected off the chest wall with cautery up to almost the clavicle.  She had a Port-A- Cath in that side, that was preserved.  The breast was then taken off undersurface of the skin with cautery.  There was one small area where it was densely adherent to the skin.  Incisions were used to dissect this away.  A small opening in the skin was made and was closed with 4-0 Monocryl.  We were able to dissect it away  from the nipple in its entirety.  The entire specimen was removed.  This was oriented and passed off the field.  An additional anterior margin was taken as well, this was oriented.  This nipple was biopsied and was negative by frozen section.  The left breast was done.  A separate incision was made in the left axilla for sentinel lymph node mapping.  Dissection was carried down and two hot sentinel nodes were identified, one blue and one just hot.  This was removed.  The curvilinear incision was made on the left inferior mammary crease and dissection was carried down to the breast itself. This was extended down to the chest wall.  The breast was dissected away from the pectoralis major fascia and occluded.  We then excised the breast tissue from the undersurface of the skin.  Around the nipple, we used face-lift scissors to prevent injury.  All the tissue was removed in its entirety.  It was then oriented with ink and left breast was sent for pathology.  Biopsy of the posterior aspect of the left nipple was negative by frozen section.  At this point in time, Dr. Harlow Mares was scrubbed into the case.  Hemostasis was achieved on both sides.  Please see the remainder of his note for dictation of the reconstruction, any drain placement and closure.     Vasil Juhasz A. Caiden Arteaga, M.D.     TAC/MEDQ  D:  05/19/2015  T:  05/19/2015  Job:  [id number]

## 2015-05-19 NOTE — Op Note (Signed)
[name], [name]                ACCOUNT NO.:  [id number]  MEDICAL RECORD NO.:  [id number]  LOCATION:  [id number]                        FACILITY:  [facility]  PHYSICIAN:  Crissie Reese, M.D.     DATE OF BIRTH:  03-23-1961  DATE OF PROCEDURE:  05/19/2015 DATE OF DISCHARGE:                              OPERATIVE REPORT   PREOPERATIVE DIAGNOSIS:  Left breast cancer.  POSTOPERATIVE DIAGNOSIS:  Left breast cancer.  PROCEDURES PERFORMED: 1. Left immediate breast reconstruction with silicone gel implant. 2. Distinct procedure, reconstruction of chest wall and reconstitution     of inframammary fold with acellular dermal matrix, left chest for     inadequate muscle and to reestablish inframammary crease, total     surface area is greater than 100 cm2. 3. Right immediate breast reconstruction with silicone gel implant. 4. Distinct procedure, reconstruction of right chest wall for     inadequate muscle and to reestablish inframammary crease, total of     80 cm2.  SURGEON:  Crissie Reese, M.D.  ASSISTANT:  Judyann Munson, RNFA.  ANESTHESIA:  General.  CLINICAL NOTE:  A 54 year old woman, has left breast cancer and has already had neoadjuvant chemotherapy.  She presents today for bilateral mastectomy.  She elected to have nipple-sparing mastectomy.  She discussed this with her general surgeon, who felt that she was a good candidate.  She desired immediate reconstruction with silicone gel implants.  She understood that it may be necessary to place tissue expander initially, but then every effort be made to go direct to the implant.  She understood that this procedure would require the use of acellular dermal matrix and she understood the nature of that material and the rationale for its use.  Significantly, she will need to have postoperative radiation treatment on the left side.  This was discussed with her at length on several separate occasions.  She understood that an option would be to  hold off on any reconstruction for the present time and do a delayed breast reconstruction after radiation.  After extensive discussion, she elected to go ahead with her reconstruction at this time with the understand that radiation would cause changes on the left side including, but not limited to radiation fibrosis, capsular contracture, and the possibility of loss of her reconstruction as well as full-thickness tissue damage.  The risks and possible complications were discussed with her regarding the surgery, but not limited to bleeding, infection, healing problems, scarring, loss of sensation, fluid accumulations, anesthesia-related complications, pneumothorax, DVT, PE, failure of devices, capsular contracture, displacement of devices, wrinkles and ripples, asymmetry, chronic pain, and loss of sensation in nipples, loss of tissue, loss of nipples and overall disappointment.  She understood all this and wished to proceed.  DESCRIPTION OF PROCEDURE:  The patient was in the operating room and bilateral nipple-sparing mastectomy completed.  The flaps and nipples were inspected and found to have excellent color and all appeared to be viable.  Thorough irrigation with saline as well as antibiotic solution. The pectoralis muscles were lifted.  Great care was taken throughout the procedure to avoid damage to underlying chest cavity.  After creating submuscular space, the Sizer placed on the left side,  325 mL and found to be too large.  It was decided that 275 mL implant would be appropriate.  The acellular dermal matrix was prepared on both sides. It was soaked in first saline for greater than 10 minutes in antibiotic solution.  It was stripped of all fluid and then rinsed as well with antibiotic solution.  After thoroughly cleaning gloves, the acellular dermal matrix was then positioned.  It was secured with 3-0 PDS in a simple interrupted sutures.  The implants were soaked in  antibiotic solution again greater than 5 minutes.  These were [employer] Smooth round high-profile silicone gel implants.  These were positioned, redundant acellular dermal matrix was removed and closure was then completed with 3-0 PDS simple interrupted sutures.  Great care was taken to avoid damage to underlying implants, which were kept under direct vision at all times.  A small additional patch was needed at the inferomedial aspect on the left side.  Again, great care was taken to avoid damage to underlying implant.  Irrigation with saline.  Again, hemostasis of the mastectomy flaps with electrocautery.  The mastectomy flaps continued to have good color.  Two 19-French drains were positioned on each side. One was brought out medial aspect on each side and another was brought out lateral on each side.  Total of four drains.  These were secured with 3-0 Prolene sutures.  The superior border of the incisions was then trimmed with bright red bleeding consistent with viability.  The closures with 3-0 Monocryl interrupted inverted deep dermal sutures followed by Dermabond.  SorbaView with BioPatches for the drains and then dry sterile dressings and the chest vest positioned and she tolerated the procedure well.  DISPOSITION:  She will be admitted to the hospital.     Crissie Reese, M.D.     DB/MEDQ  D:  05/19/2015  T:  05/19/2015  Job:  [id number]

## 2015-05-19 NOTE — Transfer of Care (Signed)
Immediate Anesthesia Transfer of Care Note  Patient: [name] [name]  Procedure(s) Performed: Procedure(s): BILATERAL NIPPLE SPARING MASTECTOMY WITH LEFT AXILLARY SENTINAL LYMPH NODE BIOPSY (Bilateral) IMMEDIATE BILATERAL BREAST RECONSTRUCTION WITH SILICONE GEL IMPLANTS  AND ADM (ACELLULAR DERMA MATRIX) (Bilateral)  Patient Location: PACU  Anesthesia Type:General  Level of Consciousness: awake, alert , patient cooperative and responds to stimulation  Airway & Oxygen Therapy: Patient Spontanous Breathing and Patient connected to nasal cannula oxygen  Post-op Assessment: Report given to RN, Post -op Vital signs reviewed and stable and Patient moving all extremities X 4  Post vital signs: Reviewed and stable  Last Vitals:  Filed Vitals:   05/19/15 0639 05/19/15 1241  BP: 97/70 116/61  Pulse: 65 77  Temp: 36.6 C 36.6 C  Resp: 18     Complications: No apparent anesthesia complications

## 2015-05-19 NOTE — Interval H&P Note (Signed)
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 After consideration of risks, benefits and other options for treatment, the patient has consented to  Procedure(s): BILATERAL NIPPLE SPARING MASTECTOMY WITH LEFT AXILLARY SENTINAL LYMPH NODE BIOPSY (Bilateral) IMMEDIATE BILATERAL BREAST RECONSTRUCTION WITH IMPLANTS  AND ADM (ACELLULAR DERMA MATRIX) (Bilateral) as a surgical intervention .  The patient's history has been reviewed, patient examined, no change in status, stable for surgery.  I have reviewed the patient's chart and labs.  Questions were answered to the patient's satisfaction.     Keean Wilmeth A.

## 2015-05-19 NOTE — OR Nursing (Signed)
Pathology weighed bilateral breasts and reported that the right breast weighed 186 grams and the left breast weighed 243 grams

## 2015-05-19 NOTE — Brief Op Note (Signed)
05/14/2015 - 05/19/2015  12:15 PM  PATIENT:  [name] [name]  54 y.o. female  PRE-OPERATIVE DIAGNOSIS:  left breast cancer right atypical ductal hyperplasia  POST-OPERATIVE DIAGNOSIS:  left breast cancer, right atypical ductal hyperplasia  PROCEDURE:  Procedure(s): BILATERAL NIPPLE SPARING MASTECTOMY WITH LEFT AXILLARY SENTINAL LYMPH NODE BIOPSY (Bilateral) IMMEDIATE BILATERAL BREAST RECONSTRUCTION WITH SILICONE GEL IMPLANTS  AND ADM (ACELLULAR DERMA MATRIX) (Bilateral)  SURGEON:  Surgeon(s) and Role: Panel 1:    * Erroll Luna, MD - Primary  Panel 2:    * Crissie Reese, MD - Primary  PHYSICIAN ASSISTANT:   ASSISTANTS: Judyann Munson, RNFA   ANESTHESIA:   general  EBL:  Total I/O In: 1000 [I.V.:1000] Out: 500 [Urine:400; Blood:100]  BLOOD ADMINISTERED:none  DRAINS: (4) Jackson-Pratt drain(s) with closed bulb suction in the left mastectomy space (2) and right space (2)   LOCAL MEDICATIONS USED:  NONE  SPECIMEN:  No Specimen  DISPOSITION OF SPECIMEN:  N/A  COUNTS:  YES  TOURNIQUET:  * No tourniquets in log *  DICTATION: .Other Dictation: Dictation Number [id number]  PLAN OF CARE: Admit to inpatient   PATIENT DISPOSITION:  PACU - hemodynamically stable.   Delay start of Pharmacological VTE agent (>24hrs) due to surgical blood loss or risk of bleeding: no

## 2015-05-20 ENCOUNTER — Encounter (HOSPITAL_COMMUNITY): Payer: Self-pay | Admitting: Surgery

## 2015-05-20 DIAGNOSIS — C50912 Malignant neoplasm of unspecified site of left female breast: Secondary | ICD-10-CM | POA: Diagnosis not present

## 2015-05-20 LAB — CBC
HCT: 26.9 % — ABNORMAL LOW (ref 36.0–46.0)
HEMOGLOBIN: 8.5 g/dL — AB (ref 12.0–15.0)
MCH: 28.3 pg (ref 26.0–34.0)
MCHC: 31.6 g/dL (ref 30.0–36.0)
MCV: 89.7 fL (ref 78.0–100.0)
Platelets: 138 10*3/uL — ABNORMAL LOW (ref 150–400)
RBC: 3 MIL/uL — AB (ref 3.87–5.11)
RDW: 13.5 % (ref 11.5–15.5)
WBC: 9.3 10*3/uL (ref 4.0–10.5)

## 2015-05-20 LAB — COMPREHENSIVE METABOLIC PANEL
ALBUMIN: 2.8 g/dL — AB (ref 3.5–5.0)
ALK PHOS: 58 U/L (ref 38–126)
ALT: 19 U/L (ref 14–54)
ANION GAP: 7 (ref 5–15)
AST: 37 U/L (ref 15–41)
BUN: 9 mg/dL (ref 6–20)
CALCIUM: 7.8 mg/dL — AB (ref 8.9–10.3)
CO2: 25 mmol/L (ref 22–32)
Chloride: 98 mmol/L — ABNORMAL LOW (ref 101–111)
Creatinine, Ser: 0.8 mg/dL (ref 0.44–1.00)
GFR calc Af Amer: >60 mL/min (ref >60)
GFR calc non Af Amer: >60 mL/min (ref >60)
GLUCOSE: 471 mg/dL — AB (ref 65–99)
Potassium: 3.3 mmol/L — ABNORMAL LOW (ref 3.5–5.1)
SODIUM: 130 mmol/L — AB (ref 135–145)
Total Bilirubin: 0.5 mg/dL (ref 0.3–1.2)
Total Protein: 4.6 g/dL — ABNORMAL LOW (ref 6.5–8.1)

## 2015-05-20 MED ORDER — HYDROMORPHONE HCL 2 MG PO TABS: 2.0000 mg | ORAL_TABLET | ORAL | Status: DC | PRN

## 2015-05-20 MED ORDER — DOXYCYCLINE HYCLATE 100 MG PO TABS: 100.0000 mg | ORAL_TABLET | Freq: Two times a day (BID) | ORAL | Status: DC

## 2015-05-20 MED ORDER — DOXYCYCLINE HYCLATE 100 MG PO TABS
100.0000 mg | ORAL_TABLET | Freq: Two times a day (BID) | ORAL | Status: DC
  Administered 2015-05-20: 100 mg via ORAL
  Filled 2015-05-20: qty 1

## 2015-05-20 MED ORDER — METHOCARBAMOL 500 MG PO TABS: 500.0000 mg | ORAL_TABLET | Freq: Four times a day (QID) | ORAL | Status: DC | PRN

## 2015-05-20 MED ORDER — ENOXAPARIN SODIUM 40 MG/0.4ML ~~LOC~~ SOLN: 40.0000 mg | SUBCUTANEOUS | Status: DC

## 2015-05-20 NOTE — Progress Notes (Signed)
1 Day Post-Op  Subjective: DOING OK  Objective: Vital signs in last 24 hours: Temp:  [98.2 F (36.8 C)-99.6 F (37.6 C)] 98.4 F (36.9 C) (11/22 1030) Pulse Rate:  [54-77] 72 (11/22 1030) Resp:  [14-16] 16 (11/22 1030) BP: (85-113)/(46-56) 85/54 mmHg (11/22 1030) SpO2:  [95 %-100 %] 100 % (11/22 1030) Last BM Date: 05/20/15  Intake/Output from previous day: 11/21 0701 - 11/22 0700 In: 3227.3 [I.V.:3227.3] Out: 2675 [Urine:2150; Drains:425; Blood:100] Intake/Output this shift: Total I/O In: 480 [P.O.:480] Out: 1230 [Urine:1150; Drains:80]  Incision/Wound:incisions clean   Lab Results:   Recent Labs  05/20/15 0300  WBC 9.3  HGB 8.5*  HCT 26.9*  PLT 138*   BMET  Recent Labs  05/20/15 0300  NA 130*  K 3.3*  CL 98*  CO2 25  GLUCOSE 471*  BUN 9  CREATININE 0.80  CALCIUM 7.8*   PT/INR No results for input(s): LABPROT, INR in the last 72 hours. ABG No results for input(s): PHART, HCO3 in the last 72 hours.  Invalid input(s): PCO2, PO2  Studies/Results: Nm Sentinel Node Inj-no Rpt (breast)  05/19/2015  CLINICAL DATA: LEFT BREAST CANCER Sulfur colloid was injected intradermally by the nuclear medicine technologist for breast cancer sentinel node localization.    Anti-infectives: Anti-infectives    Start     Dose/Rate Route Frequency Ordered Stop   05/20/15 1030  doxycycline (VIBRA-TABS) tablet 100 mg     100 mg Oral Every 12 hours 05/20/15 1019     05/20/15 0000  doxycycline (VIBRA-TABS) 100 MG tablet     100 mg Oral Every 12 hours 05/20/15 1025     05/19/15 1800  ceFAZolin (ANCEF) IVPB 1 g/50 mL premix  Status:  Discontinued     1 g 100 mL/hr over 30 Minutes Intravenous 4 times per day 05/19/15 1442 05/20/15 1019   05/19/15 0831  bacitracin 50,000 Units, gentamicin (GARAMYCIN) 80 mg, ceFAZolin (ANCEF) 1 g in sodium chloride 0.9 % 1,000 mL  Status:  Discontinued       As needed 05/19/15 [id number] 05/19/15 1240   05/19/15 0715  bacitracin 50,000 Units,  gentamicin (GARAMYCIN) 80 mg, ceFAZolin (ANCEF) 1 g in sodium chloride 0.9 % 1,000 mL  Status:  Discontinued      Irrigation Once 05/19/15 0713 05/19/15 1429   05/19/15 0604  ceFAZolin (ANCEF) 2-3 GM-% IVPB SOLR    Comments:  Ardine Eng   : cabinet override      05/19/15 0604 05/19/15 1814   05/19/15 0600  ceFAZolin (ANCEF) 3 g in dextrose 5 % 50 mL IVPB  Status:  Discontinued     3 g 160 mL/hr over 30 Minutes Intravenous On call to O.R. 05/19/15 0600 05/19/15 0602   05/19/15 0600  ceFAZolin (ANCEF) IVPB 2 g/50 mL premix     2 g 100 mL/hr over 30 Minutes Intravenous On call to O.R. 05/18/15 1425 05/19/15 1148      Assessment/Plan: s/p Procedure(s): BILATERAL NIPPLE SPARING MASTECTOMY WITH LEFT AXILLARY SENTINAL LYMPH NODE BIOPSY (Bilateral) IMMEDIATE BILATERAL BREAST RECONSTRUCTION WITH SILICONE GEL IMPLANTS  AND ADM (ACELLULAR DERMA MATRIX) (Bilateral) Doing well Home later today if pain controlled Labs probably erroneous No history of DM   LOS: 1 day    Ravonda Brecheen A. 05/20/2015

## 2015-05-20 NOTE — Discharge Instructions (Addendum)
No lifting for 6 weeks No vigorous activity for 6 weeks (including outdoor walks) No driving for 4 weeks OK to walk up stairs slowly No lifting, no exercising, no raising arms overhead Stay propped up Use incentive spirometer at home every hour while awake No shower while drains are in place Empty drains at least three times a day and record the amounts separately Change drain dressings every third day if instructed to do so by Dr. Harlow Mares (No need to do this yet)  Apply Bacitracin antibiotic ointment to the drain sites  Place gauze dressing over drains  Secure the gauze with tape Take an over-the-counter Probiotic while on antibiotics Take an over-the-counter stool softener (such as Colace) while on pain medication See Dr. Harlow Mares in office next week For questions call [phone] or [phone]

## 2015-05-20 NOTE — Discharge Summary (Signed)
Physician Discharge Summary  Patient ID: [name] [name] MRN: [id number] DOB/AGE: 1960-11-15 54 y.o.  Admit date: 05/19/2015 Discharge date: 05/20/2015  Admission Diagnoses: Left breast cancer  Discharge Diagnoses: Same Active Problems:   Breast cancer, female, left   Breast cancer in female [facility])   Discharged Condition: good  Hospital Course: On the day of admission the patient was taken to surgery and had bilateral mastectomy with left sentinel node and bilateral reconstruction with silicone gel implants and acellular dermal matrix. The patient tolerated the procedures well. Postoperatively, the mastectomy flaps maintained excellent color and capillary refill. The patient was ambulatory and tolerating diet on the first postoperative day.  Treatments: antibiotics: Ancef, anticoagulation: LMW heparin and surgery: bilateral mastectomy with left sentinel node and reconstruction with implants and ADM  Discharge Exam: Blood pressure 98/50, pulse 54, temperature 98.2 F (36.8 C), temperature source Oral, resp. rate 15, height 5' 0.5" (1.537 m), weight 109 lb (49.442 kg), SpO2 96 %.  Operative sites: Mastectomy flaps viable. Nipple complexes appear viable. Implants appear to be in good position. Drains functioning. Drainage thin. No evidence of bleeding or infection either side..  Disposition: 01-Home or Self Care     Medication List    TAKE these medications        acetaminophen 325 MG tablet  Commonly known as:  TYLENOL  Take 650 mg by mouth every 6 (six) hours as needed for mild pain.     ACIDOPHILUS PO  Take 1 capsule by mouth daily.     atenolol 25 MG tablet  Commonly known as:  TENORMIN  Take 75 mg by mouth daily. For migraines not blood pressure     CALCIUM 600 PO  Take 1 tablet by mouth daily.     cholecalciferol 1000 UNITS tablet  Commonly known as:  VITAMIN D  Take 1,000 Units by mouth daily.     docusate sodium 100 MG capsule  Commonly known as:  COLACE   Take 100 mg by mouth 2 (two) times daily as needed for mild constipation.     doxycycline 100 MG tablet  Commonly known as:  VIBRA-TABS  Take 100 mg by mouth 2 (two) times daily.     doxycycline 100 MG tablet  Commonly known as:  VIBRA-TABS  Take 1 tablet (100 mg total) by mouth every 12 (twelve) hours.     eletriptan 40 MG tablet  Commonly known as:  RELPAX  Take 1 tablet (40 mg total) by mouth every 2 (two) hours as needed for migraine or headache (Max 2 per day).     enoxaparin 40 MG/0.4ML injection  Commonly known as:  LOVENOX  Inject 0.4 mLs (40 mg total) into the skin daily.     hydrocortisone 2.5 % ointment  Apply topically 2 (two) times daily.     HYDROmorphone 2 MG tablet  Commonly known as:  DILAUDID  Take 1-2 tablets (2-4 mg total) by mouth every 4 (four) hours as needed for moderate pain.     lidocaine-prilocaine cream  Commonly known as:  EMLA  Apply to affected area once     methocarbamol 500 MG tablet  Commonly known as:  ROBAXIN  Take 1 tablet (500 mg total) by mouth every 6 (six) hours as needed for muscle spasms.     PARoxetine 20 MG tablet  Commonly known as:  PAXIL  Take 20 mg by mouth.     promethazine 12.5 MG tablet  Commonly known as:  PHENERGAN  Take 1 tablet (12.5  mg total) by mouth every 6 (six) hours as needed for nausea or vomiting.         SignedHarlow Mares, Maresha Anastos M 05/20/2015, 10:25 AM

## 2015-05-20 NOTE — Anesthesia Postprocedure Evaluation (Signed)
Anesthesia Post Note  Patient: Althea Grimmer  Procedure(s) Performed: Procedure(s) (LRB): BILATERAL NIPPLE SPARING MASTECTOMY WITH LEFT AXILLARY SENTINAL LYMPH NODE BIOPSY (Bilateral) IMMEDIATE BILATERAL BREAST RECONSTRUCTION WITH SILICONE GEL IMPLANTS  AND ADM (ACELLULAR DERMA MATRIX) (Bilateral)  Patient location during evaluation: PACU Anesthesia Type: General and Regional Level of consciousness: sedated, patient cooperative and responds to stimulation Pain management: pain level controlled Vital Signs Assessment: post-procedure vital signs reviewed and stable Respiratory status: spontaneous breathing, nonlabored ventilation, respiratory function stable and patient connected to nasal cannula oxygen Cardiovascular status: blood pressure returned to baseline and stable Postop Assessment: No signs of nausea or vomiting Anesthetic complications: no    Last Vitals:  Filed Vitals:   05/20/15 0623 05/20/15 1030  BP: 98/50 85/54  Pulse: 54 72  Temp: 36.8 C 36.9 C  Resp: 15 16    Last Pain:  Filed Vitals:   05/20/15 1059  PainSc: 4                  Kaidyn Hernandes,E. Thelonious Kauffmann

## 2015-05-29 ENCOUNTER — Telehealth: Payer: Self-pay | Admitting: Hematology and Oncology

## 2015-05-29 ENCOUNTER — Encounter: Payer: Self-pay | Admitting: General Practice

## 2015-05-29 ENCOUNTER — Encounter: Payer: Self-pay | Admitting: Hematology and Oncology

## 2015-05-29 ENCOUNTER — Ambulatory Visit (HOSPITAL_BASED_OUTPATIENT_CLINIC_OR_DEPARTMENT_OTHER): Payer: BLUE CROSS/BLUE SHIELD

## 2015-05-29 ENCOUNTER — Ambulatory Visit (HOSPITAL_BASED_OUTPATIENT_CLINIC_OR_DEPARTMENT_OTHER): Payer: BLUE CROSS/BLUE SHIELD | Admitting: Hematology and Oncology

## 2015-05-29 ENCOUNTER — Other Ambulatory Visit (HOSPITAL_BASED_OUTPATIENT_CLINIC_OR_DEPARTMENT_OTHER): Payer: BLUE CROSS/BLUE SHIELD

## 2015-05-29 VITALS — BP 95/55 | HR 71 | Temp 98.0°F | Resp 18 | Ht 60.5 in | Wt 107.0 lb

## 2015-05-29 DIAGNOSIS — C50412 Malignant neoplasm of upper-outer quadrant of left female breast: Secondary | ICD-10-CM | POA: Diagnosis not present

## 2015-05-29 DIAGNOSIS — Z171 Estrogen receptor negative status [ER-]: Secondary | ICD-10-CM | POA: Diagnosis not present

## 2015-05-29 DIAGNOSIS — T451X5A Adverse effect of antineoplastic and immunosuppressive drugs, initial encounter: Principal | ICD-10-CM

## 2015-05-29 DIAGNOSIS — D6481 Anemia due to antineoplastic chemotherapy: Secondary | ICD-10-CM

## 2015-05-29 DIAGNOSIS — Z5112 Encounter for antineoplastic immunotherapy: Secondary | ICD-10-CM | POA: Diagnosis not present

## 2015-05-29 LAB — CBC WITH DIFFERENTIAL/PLATELET
BASO%: 0.8 % (ref 0.0–2.0)
BASOS ABS: 0 10*3/uL (ref 0.0–0.1)
EOS ABS: 0.1 10*3/uL (ref 0.0–0.5)
EOS%: 2.2 % (ref 0.0–7.0)
HEMATOCRIT: 33.3 % — AB (ref 34.8–46.6)
HEMOGLOBIN: 11 g/dL — AB (ref 11.6–15.9)
LYMPH#: 1.7 10*3/uL (ref 0.9–3.3)
LYMPH%: 27.4 % (ref 14.0–49.7)
MCH: 28.4 pg (ref 25.1–34.0)
MCHC: 33.2 g/dL (ref 31.5–36.0)
MCV: 85.7 fL (ref 79.5–101.0)
MONO#: 0.4 10*3/uL (ref 0.1–0.9)
MONO%: 7.1 % (ref 0.0–14.0)
NEUT%: 62.5 % (ref 38.4–76.8)
NEUTROS ABS: 4 10*3/uL (ref 1.5–6.5)
Platelets: 370 10*3/uL (ref 145–400)
RBC: 3.88 10*6/uL (ref 3.70–5.45)
RDW: 13.4 % (ref 11.2–14.5)
WBC: 6.3 10*3/uL (ref 3.9–10.3)

## 2015-05-29 LAB — COMPREHENSIVE METABOLIC PANEL (CC13)
ALBUMIN: 3.4 g/dL — AB (ref 3.5–5.0)
ALK PHOS: 157 U/L — AB (ref 40–150)
ALT: 20 U/L (ref 0–55)
AST: 33 U/L (ref 5–34)
Anion Gap: 10 mEq/L (ref 3–11)
BUN: 14.3 mg/dL (ref 7.0–26.0)
CALCIUM: 10.2 mg/dL (ref 8.4–10.4)
CO2: 29 mEq/L (ref 22–29)
Chloride: 102 mEq/L (ref 98–109)
Creatinine: 0.8 mg/dL (ref 0.6–1.1)
EGFR: 88 mL/min/{1.73_m2} — ABNORMAL LOW (ref >90)
GLUCOSE: 95 mg/dL (ref 70–140)
POTASSIUM: 4.1 meq/L (ref 3.5–5.1)
SODIUM: 140 meq/L (ref 136–145)
TOTAL PROTEIN: 7.2 g/dL (ref 6.4–8.3)

## 2015-05-29 MED ORDER — TRASTUZUMAB CHEMO INJECTION 440 MG
6.0000 mg/kg | Freq: Once | INTRAVENOUS | Status: AC
  Administered 2015-05-29: 294 mg via INTRAVENOUS
  Filled 2015-05-29: qty 14

## 2015-05-29 MED ORDER — DIPHENHYDRAMINE HCL 25 MG PO CAPS: 50.0000 mg | ORAL_CAPSULE | Freq: Once | ORAL | Status: DC

## 2015-05-29 MED ORDER — SODIUM CHLORIDE 0.9 % IJ SOLN
10.0000 mL | INTRAMUSCULAR | Status: DC | PRN
  Administered 2015-05-29: 10 mL
  Filled 2015-05-29: qty 10

## 2015-05-29 MED ORDER — ACETAMINOPHEN 325 MG PO TABS: 650.0000 mg | ORAL_TABLET | Freq: Once | ORAL | Status: DC

## 2015-05-29 MED ORDER — SODIUM CHLORIDE 0.9 % IV SOLN
Freq: Once | INTRAVENOUS | Status: AC
  Administered 2015-05-29: 10:00:00 via INTRAVENOUS

## 2015-05-29 MED ORDER — HEPARIN SOD (PORK) LOCK FLUSH 100 UNIT/ML IV SOLN
500.0000 [IU] | Freq: Once | INTRAVENOUS | Status: AC | PRN
  Administered 2015-05-29: 500 [IU]
  Filled 2015-05-29: qty 5

## 2015-05-29 NOTE — Progress Notes (Signed)
Patient Care Team: Laray Anger, MD as PCP - General (Obstetrics and Gynecology) Erroll Luna, MD as Consulting Physician (General Surgery) Nicholas Lose, MD as Consulting Physician (Hematology and Oncology) Gery Pray, MD as Consulting Physician (Radiation Oncology) Mauro Kaufmann, RN as Registered Nurse Rockwell Germany, RN as Registered Nurse  DIAGNOSIS: Breast cancer of upper-outer quadrant of left female breast [facility])   Staging form: Breast, AJCC 7th Edition     Clinical stage from 10/16/2014: Stage IIA (T1c, N1, M0) - Unsigned   SUMMARY OF ONCOLOGIC HISTORY:   Breast cancer of upper-outer quadrant of left female breast ([place])   10/01/2014 Mammogram Left breast mammogram and ultrasound for palpable left breast mass which was a maxillary mass with an ill-defined abnormality by ultrasound 1.7 cm at 2:00   10/11/2014 Initial Diagnosis Left breast invasive ductal carcinoma, grade 3, ER/PR negative, 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 5.32, axillary lymph node also positive for cancer   10/30/2014 Imaging CT chest abdomen pelvis and bone scan revealed no evidence of distant metastatic disease but enlarged left axillary and left subpectoral lymph nodes   11/01/2014 -  Neo-Adjuvant Chemotherapy Neoadjuvant TCH [place] to 3 weeks 6 cycles followed by Herceptin maintenance   02/17/2015 Breast MRI No residual mass or abnormal enhancement, no enlarged lymph nodes   05/19/2015 Surgery Bilateral mastectomies with immediate reconstruction Left: Radial scar, FA, UDH no malignancy 0/1 lymph node negative; right: FA, radial scar, UV H no malignancy [id number]    CHIEF COMPLIANT: Follow-up after bilateral mastectomies and reconstruction  INTERVAL HISTORY: [name] [name] is a 54 year old with above-mentioned history of left breast cancer treated with neoadjuvant chemotherapy followed by bilateral mastectomies and immediate reconstruction. She had a complete pathologic response. She is here today to discuss the results. She  complains of the left breast had a mild area of redness. Other than that she has been taking Dilaudid as needed.  REVIEW OF SYSTEMS:   Constitutional: Denies fevers, chills or abnormal weight loss Eyes: Denies blurriness of vision Ears, nose, mouth, throat, and face: Denies mucositis or sore throat Respiratory: Denies cough, dyspnea or wheezes Cardiovascular: Denies palpitation, chest discomfort or lower extremity swelling Gastrointestinal:  Denies nausea, heartburn or change in bowel habits Skin: Denies abnormal skin rashes Lymphatics: Denies new lymphadenopathy or easy bruising Neurological:Denies numbness, tingling or new weaknesses Behavioral/Psych: Mood is stable, no new changes  Breast: Recent breast reconstruction after mastectomies All other systems were reviewed with the patient and are negative.  I have reviewed the past medical history, past surgical history, social history and family history with the patient and they are unchanged from previous note.  ALLERGIES:  is allergic to meperidine; stadol; zofran; and sulfa antibiotics.  MEDICATIONS:  Current Outpatient Prescriptions  Medication Sig Dispense Refill  . acetaminophen (TYLENOL) 325 MG tablet Take 650 mg by mouth every 6 (six) hours as needed for mild pain.     Marland Kitchen atenolol (TENORMIN) 25 MG tablet Take 75 mg by mouth daily. For migraines not blood pressure    . Calcium Carbonate (CALCIUM 600 PO) Take 1 tablet by mouth daily.     . cholecalciferol (VITAMIN D) 1000 UNITS tablet Take 1,000 Units by mouth daily.    Marland Kitchen docusate sodium (COLACE) 100 MG capsule Take 100 mg by mouth 2 (two) times daily as needed for mild constipation.    Marland Kitchen doxycycline (VIBRA-TABS) 100 MG tablet Take 100 mg by mouth 2 (two) times daily.    Marland Kitchen doxycycline (VIBRA-TABS) 100 MG tablet Take 1 tablet (  100 mg total) by mouth every 12 (twelve) hours. 30 tablet 0  . eletriptan (RELPAX) 40 MG tablet Take 1 tablet (40 mg total) by mouth every 2 (two) hours as  needed for migraine or headache (Max 2 per day). 10 tablet 3  . enoxaparin (LOVENOX) 40 MG/0.4ML injection Inject 0.4 mLs (40 mg total) into the skin daily. 12 Syringe 0  . hydrocortisone 2.5 % ointment Apply topically 2 (two) times daily. (Patient taking differently: Apply 1 application topically 2 (two) times daily as needed. ) 30 g 0  . HYDROmorphone (DILAUDID) 2 MG tablet Take 1-2 tablets (2-4 mg total) by mouth every 4 (four) hours as needed for moderate pain. 40 tablet 0  . Lactobacillus (ACIDOPHILUS PO) Take 1 capsule by mouth daily.     Marland Kitchen lidocaine-prilocaine (EMLA) cream Apply to affected area once (Patient taking differently: Apply 1 application topically as needed. Apply to affected area once) 30 g 3  . methocarbamol (ROBAXIN) 500 MG tablet Take 1 tablet (500 mg total) by mouth every 6 (six) hours as needed for muscle spasms. 40 tablet 1  . PARoxetine (PAXIL) 20 MG tablet Take 20 mg by mouth.    . promethazine (PHENERGAN) 12.5 MG tablet Take 1 tablet (12.5 mg total) by mouth every 6 (six) hours as needed for nausea or vomiting. 90 tablet 3   No current facility-administered medications for this visit.    PHYSICAL EXAMINATION: ECOG PERFORMANCE STATUS: 1 - Symptomatic but completely ambulatory  Filed Vitals:   05/29/15 0858  BP: 95/55  Pulse: 71  Temp: 98 F (36.7 C)  Resp: 18   Filed Weights   05/29/15 0858  Weight: 107 lb (48.535 kg)    GENERAL:alert, no distress and comfortable SKIN: skin color, texture, turgor are normal, no rashes or significant lesions EYES: normal, Conjunctiva are pink and non-injected, sclera clear OROPHARYNX:no exudate, no erythema and lips, buccal mucosa, and tongue normal  NECK: supple, thyroid normal size, non-tender, without nodularity LYMPH:  no palpable lymphadenopathy in the cervical, axillary or inguinal LUNGS: clear to auscultation and percussion with normal breathing effort HEART: regular rate & rhythm and no murmurs and no lower  extremity edema ABDOMEN:abdomen soft, non-tender and normal bowel sounds Musculoskeletal:no cyanosis of digits and no clubbing  NEURO: alert & oriented x 3 with fluent speech, no focal motor/sensory deficits  LABORATORY DATA:  I have reviewed the data as listed   Chemistry      Component Value Date/Time   NA 130* 05/20/2015 0300   NA 143 04/17/2015 0825   K 3.3* 05/20/2015 0300   K 4.0 04/17/2015 0825   CL 98* 05/20/2015 0300   CO2 25 05/20/2015 0300   CO2 28 04/17/2015 0825   BUN 9 05/20/2015 0300   BUN 16.3 04/17/2015 0825   CREATININE 0.80 05/20/2015 0300   CREATININE 0.7 04/17/2015 0825      Component Value Date/Time   CALCIUM 7.8* 05/20/2015 0300   CALCIUM 9.5 04/17/2015 0825   ALKPHOS 58 05/20/2015 0300   ALKPHOS 104 04/17/2015 0825   AST 37 05/20/2015 0300   AST 30 04/17/2015 0825   ALT 19 05/20/2015 0300   ALT 19 04/17/2015 0825   BILITOT 0.5 05/20/2015 0300   BILITOT 0.30 04/17/2015 0825       Lab Results  Component Value Date   WBC 6.3 05/29/2015   HGB 11.0* 05/29/2015   HCT 33.3* 05/29/2015   MCV 85.7 05/29/2015   PLT 370 05/29/2015   NEUTROABS 4.0 05/29/2015  ASSESSMENT & PLAN:  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:00 position, left axillary lymph node palpable, ER/PR negative, 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 5.32, T1 cN1 M0 stage II a clinical stage Treatment summary: Neo adjuvant chemotherapy with [place] for general every 3 weeks 6 cycles started 11/01/2014 completed 02/14/2015 Post-neo-adjuvant breast MRI 02/17/2015: No residual mass or abnormal enhancement, no enlarged lymph nodes Post-neo-adjuvant CT chest 02/18/2015: Interval resolution of the left breast mass and multiple enlarged left axillary and subpectoral lymph nodes  Bilateral mastectomies 05/19/2015 Left: Radial scar, FA, UDH no malignancy 0/1 lymph node negative; right: FA, radial scar, UDH no malignancy  (pathologic complete response) [id number] I discussed with the patient that she had a pathologic complete response and we are extremely thrilled to see this result. She will continue with maintenance Herceptin for 1 year of total therapy.  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 Because of the subpectoral lymph nodes, there is some concern for recurrence without radiation. Patient is leaning on not undergoing radiation therapy. We will present her in the tumor board and make a final decision.  Return to clinic in 6 weeks for follow-up and every 3 weeks for Herceptin treatments.   No orders of the defined types were placed in this encounter.   The patient has a good understanding of the overall plan. she agrees with it. she will call with any problems that may develop before the next visit here.   Rulon Eisenmenger, MD 05/29/2015

## 2015-05-29 NOTE — Addendum Note (Signed)
Addended by: Prentiss Bells on: 05/29/2015 03:47 PM   Modules accepted: Orders, Medications

## 2015-05-29 NOTE — Patient Instructions (Signed)
[facility] Discharge Instructions for Patients Receiving Chemotherapy  Today you received the following chemotherapy agents: Herceptin   To help prevent nausea and vomiting after your treatment, we encourage you to take your nausea medication as directed.    If you develop nausea and vomiting that is not controlled by your nausea medication, call the clinic.   BELOW ARE SYMPTOMS THAT SHOULD BE REPORTED IMMEDIATELY:  *FEVER GREATER THAN 100.5 F  *CHILLS WITH OR WITHOUT FEVER  NAUSEA AND VOMITING THAT IS NOT CONTROLLED WITH YOUR NAUSEA MEDICATION  *UNUSUAL SHORTNESS OF BREATH  *UNUSUAL BRUISING OR BLEEDING  TENDERNESS IN MOUTH AND THROAT WITH OR WITHOUT PRESENCE OF ULCERS  *URINARY PROBLEMS  *BOWEL PROBLEMS  UNUSUAL RASH Items with * indicate a potential emergency and should be followed up as soon as possible.  Feel free to call the clinic you have any questions or concerns. The clinic phone number is [phone].  Please show the CHEMO ALERT CARD at check-in to the Emergency Department and triage nurse.   

## 2015-05-29 NOTE — Progress Notes (Signed)
Spiritual Care Note  [name] [name] had me paged to infusion in order to share joyful news: her sister-in-law has improved so much that the ICU physician called it "a miracle."  [name] [name] was overjoyed, deeply relieved, and grateful for all the support she and her family have received. She almost forgot to share that her own pathology report came back clear!  Gratitude is tremendously helpful in coping with her recovery from surgery.  Per pt, she still hopes not to need radiation but is open to what MDs will recommend after conference.    Served as witness to her story, joining her in praise and celebration, which pt found very meaningful.  She verbalized gratitude for emotional/prayer support and is aware of ongoing chaplain support as desired.  [place], [place], [facility] Pager [phone] Voicemail  [phone]

## 2015-05-29 NOTE — Assessment & Plan Note (Signed)
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:00 position, left axillary lymph node palpable, ER/PR negative, 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 5.32, T1 cN1 M0 stage II a clinical stage Treatment summary: Neo adjuvant chemotherapy with [facility] for general every 3 weeks 6 cycles started 11/01/2014 completed 02/14/2015 Post-neo-adjuvant breast MRI 02/17/2015: No residual mass or abnormal enhancement, no enlarged lymph nodes Post-neo-adjuvant CT chest 02/18/2015: Interval resolution of the left breast mass and multiple enlarged left axillary and subpectoral lymph nodes  Bilateral mastectomies 05/19/2015 Left: Radial scar, FA, UDH no malignancy 0/1 lymph node negative; right: FA, radial scar, UDH no malignancy (pathologic complete response) [id number] I discussed with the patient that she had a pathologic complete response and we are extremely thrilled to see this result. She will continue with maintenance Herceptin for 1 year of total therapy.  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

## 2015-05-29 NOTE — Telephone Encounter (Signed)
Gave patient avs report and appointments for December thru February.  °

## 2015-06-12 ENCOUNTER — Ambulatory Visit: Payer: BLUE CROSS/BLUE SHIELD | Attending: Plastic Surgery | Admitting: Physical Therapy

## 2015-06-12 DIAGNOSIS — M25612 Stiffness of left shoulder, not elsewhere classified: Secondary | ICD-10-CM | POA: Diagnosis not present

## 2015-06-12 DIAGNOSIS — Z9189 Other specified personal risk factors, not elsewhere classified: Secondary | ICD-10-CM | POA: Diagnosis present

## 2015-06-12 DIAGNOSIS — C50412 Malignant neoplasm of upper-outer quadrant of left female breast: Secondary | ICD-10-CM | POA: Diagnosis present

## 2015-06-12 DIAGNOSIS — M25611 Stiffness of right shoulder, not elsewhere classified: Secondary | ICD-10-CM

## 2015-06-12 NOTE — Patient Instructions (Signed)
  Cane Exercise: Flexion   Lie on back, holding cane above chest. Keeping arms as straight as possible, lower cane toward floor beyond head. Hold ____ seconds. Repeat ____ times. Do ____ sessions per day.  http://gt2.exer.us/91   Copyright  VHI. All rights reserved.    Hand behind head. Elbow supported on pillows as comfortable to allow a stretch

## 2015-06-13 NOTE — Therapy (Signed)
[place], [place], [address] Phone: [phone]   Fax:  [phone]  Physical Therapy Evaluation  Patient Details  Name: [name] [name] MRN: [id number] Date of Birth: 07-16-1960 Referring Provider: Harlow Mares   Encounter Date: 06/12/2015      PT End of Session - 06/13/15 1257    Visit Number 1   Number of Visits 9   Date for PT Re-Evaluation 07/14/15   PT Start Time 1350   PT Stop Time 1430   PT Time Calculation (min) 40 min   Activity Tolerance Patient tolerated treatment well   Behavior During Therapy [facility] for tasks assessed/performed      Past Medical History  Diagnosis Date  . Osteopenia   . Neurogenic bladder   . Wears contact lenses   . Family history of breast cancer   . Family history of colon cancer   . Breast cancer of upper-outer quadrant of right female breast ([place]) 10/11/2014  . Breast cancer ([place]) 2016    ER-/PR-/Her2+  . Migraines     takes metoprolol  for migraine headaches    Past Surgical History  Procedure Laterality Date  . Vein ligation      both legs  . Colonoscopy    . Upper gi endoscopy    . Portacath placement Right 10/29/2014    Procedure: INSERTION PORT-A-CATH WITH ULTRASOUND;  Surgeon: Erroll Luna, MD;  Location: [place];  Service: General;  Laterality: Right;  . Breast surgery      rt, breast biopsy  . Nipple sparing mastectomy/sentinal lymph node biopsy/reconstruction/placement of tissue expander Bilateral 05/19/2015    Procedure: BILATERAL NIPPLE SPARING MASTECTOMY WITH LEFT AXILLARY SENTINAL LYMPH NODE BIOPSY;  Surgeon: Erroll Luna, MD;  Location: [place];  Service: General;  Laterality: Bilateral;  . Breast reconstruction with placement of tissue expander and flex hd (acellular hydrated dermis) Bilateral 05/19/2015    Procedure: IMMEDIATE BILATERAL BREAST RECONSTRUCTION WITH SILICONE GEL IMPLANTS  AND ADM (ACELLULAR DERMA MATRIX);   Surgeon: Crissie Reese, MD;  Location: [place];  Service: Plastics;  Laterality: Bilateral;    There were no vitals filed for this visit.  Visit Diagnosis:  Shoulder stiffness, left  Shoulder stiffness, right      Subjective Assessment - 06/12/15 1402    Subjective new pain in chest right at the top of the implants. She still has activity restrictions    Patient is accompained by: --  friend   Pertinent History Diagnosed 10/03/14 with left ER/PR negative, HER2 positive breast cancer.  She also has a positive left axillary node. Bilateral mastectomy with 2 nodes removed from the left side, none on the right.on Nov. 21 with immedicate silicone implants. Has had chemotherapy and will continue with target therapy till June and will see about radition therapy in  January.     Currently in Pain? Yes   Pain Score 5    Pain Location Chest   Pain Orientation Right;Left   Pain Descriptors / Indicators Burning   Pain Type Acute pain   Pain Onset In the past 7 days   Pain Frequency Constant   Aggravating Factors  actvitiy makes it worse    Pain Relieving Factors lie down and rest             [facility] PT Assessment - 06/13/15 0001    Assessment   Medical Diagnosis Left breast cancer   Onset Date/Surgical Date 10/03/14   Precautions   Precautions Other (comment)  No massage   Restrictions   Weight Bearing Restrictions No   Balance Screen   Has the patient fallen in the past 6 months No   Has the patient had a decrease in activity level because of a fear of falling?  No   Is the patient reluctant to leave their home because of a fear of falling?  No   Home Environment   Living Environment Private residence   Living Arrangements Spouse/significant other;Children  38 y.o. daughter   Available Help at Discharge Family   Prior Function   Level of Independence Independent with basic ADLs   Vocation --  on leave from work    [employer] various activities including lifting and  carrying   Leisure She plays tennis, golf, runs, Pilates - some kind of cardio 3-4x/wk for 30-60 minutes  pt is on activity restrictions    Cognition   Overall Cognitive Status Within Functional Limits for tasks assessed   Observation/Other Assessments   Observations thin female with rounded shoulder poture. healing incisions under breasts,   Skin Integrity intact per patient report    Coordination   Gross Motor Movements are Fluid and Coordinated Yes   Posture/Postural Control   Posture/Postural Control Postural limitations   Postural Limitations Rounded Shoulders;Forward head;Increased thoracic kyphosis   ROM / Strength   AROM / PROM / Strength AROM;Strength   AROM   Right Shoulder Flexion 104 Degrees   Right Shoulder ABduction 98 Degrees   Right Shoulder External Rotation 85 Degrees   Left Shoulder Flexion 109 Degrees   Left Shoulder ABduction 84 Degrees  pull into medial forearm   Left Shoulder External Rotation 82 Degrees   Strength   Overall Strength Within functional limits for tasks performed;Deficits   Overall Strength Comments pt has limitations in raising both arms did not isometrically test, strength ~ 3-/5 She appears to have symmetrical scapular excursion           LYMPHEDEMA/ONCOLOGY QUESTIONNAIRE - 06/12/15 1420    Right Upper Extremity Lymphedema   10 cm Proximal to Olecranon Process 22 cm   Olecranon Process 21 cm   10 cm Proximal to Ulnar Styloid Process 16.6 cm   Just Proximal to Ulnar Styloid Process 13.5 cm   Across Hand at [employer] 16.5 cm   At [place] of 2nd Digit 5.6 cm   Left Upper Extremity Lymphedema   10 cm Proximal to Olecranon Process 22.5 cm   Olecranon Process 21 cm   10 cm Proximal to Ulnar Styloid Process 17 cm   Just Proximal to Ulnar Styloid Process 13 cm   Across Hand at [employer] 16 cm   At [place] of 2nd Digit 5.7 cm           Quick Dash - 06/13/15 0001    Open a tight or new jar Unable   Do heavy household chores  (wash walls, wash floors) Unable   Carry a shopping bag or briefcase Unable   Wash your back Moderate difficulty   Use a knife to cut food No difficulty   Recreational activities in which you take some force or impact through your arm, shoulder, or hand (golf, hammering, tennis) Unable   During the past week, to what extent has your arm, shoulder or hand problem interfered with your normal social activities with family, friends, neighbors, or groups? Modererately   During the past week, to what extent has your arm, shoulder or hand problem limited your work or  other regular daily activities Extremely   Arm, shoulder, or hand pain. Mild   Tingling (pins and needles) in your arm, shoulder, or hand None   Difficulty Sleeping Mild difficulty   DASH Score 59.09 %             [facility] Adult PT Treatment/Exercise - 06/13/15 0001    Shoulder Exercises: Supine   Other Supine Exercises supine dowel rod flexion exercise                PT Education - 06/13/15 1304    Education provided Yes   Education Details supine dowel rod flexion    Person(s) Educated Patient   Methods Explanation;Demonstration;Handout   Comprehension Verbalized understanding;Returned demonstration           Short Term Clinic Goals - 06/13/15 1307    CC Short Term Goal  #1   Title Short term goals = long term goals             Long Term Clinic Goals - 06/13/15 1307    CC Long Term Goal  #1   Title Patient will improve left shoulder flexion range of motion to 125 degrees so reach up in her kitchen cabinets (07/14/2015)   Baseline 109   Time 4   Period Weeks   Status New   CC Long Term Goal  #2   Title Patient will improve right shoulder flexion range of motion to 125 degrees to perform activities of daily living and household chores with greater ease(07/14/2015)   Baseline 104   Time 4   Period Weeks   CC Long Term Goal  #3   Title Patient will improve left shoulder abduction to 120 degrees so  that she can wash her hair with greater ease  (07/14/2015)   Baseline 84   Time 4   Period Weeks   Status New   CC Long Term Goal  #4   Title Patient will improve right shoulder abduction to 120 degrees so that she can wash her hair with greater ease (07/14/2015)   Baseline 98   Time 4   Period Weeks   Status New   CC Long Term Goal  #5   Title Patient will be independent in a basic advanced home exercise program (07/14/2015)   Time 4   Period Weeks   Status New            Plan - 06/13/15 1258    Clinical Impression Statement Patient is 3.5 weeks post bilateral mastectomy with immediate implant placement. She is under activity restrictions and has "massage precaution" and is referredt to [place] to improve shoulder range of motion.  She presents with forward head, rounded sholder posture and decreased  shoulder range of motion.    Pt will benefit from skilled therapeutic intervention in order to improve on the following deficits Decreased range of motion;Postural dysfunction   Rehab Potential Excellent   Clinical Impairments Affecting Rehab Potential continues with target therapy through IV every 3 weeks  Activity restriction with no massage.    PT Frequency 2x / week   PT Duration 4 weeks   PT Treatment/Interventions Therapeutic exercise;Patient/family education   PT Next Visit Plan meeks spinal realignement exercise in supine, progress scapular and shoulder range of motion HEP    Consulted and Agree with Plan of Care Patient         Problem List Patient Active Problem List   Diagnosis Date Noted  . Breast cancer, female, left 05/19/2015  .  Breast cancer in female [facility]) 05/19/2015  . Nausea without vomiting 12/30/2014  . Diarrhea 12/30/2014  . Dehydration 12/30/2014  . Hypotension 12/30/2014  . 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 External hemorrhoid 12/13/2014  . UTI (urinary tract infection) 11/16/2014  . Genetic testing 11/07/2014  . Migraine  headache 11/06/2014  . Family history of breast cancer   . Family history of colon cancer   . Breast cancer of upper-outer quadrant of left female breast ([place]) 10/11/2014   Donato Heinz. Owens Shark, PT    06/13/2015, 1:14 PM  [place], [place], [address] Phone: [phone]   Fax:  [phone]  Name: [name] [name] MRN: [id number] Date of Birth: 1961-06-17

## 2015-06-16 ENCOUNTER — Encounter: Payer: Self-pay | Admitting: Radiation Oncology

## 2015-06-16 NOTE — Progress Notes (Signed)
Location of Breast Cancer: Stage IIA (T1c, N1, M0) Left breast invasive ductal carcinoma  Histology per Pathology Report:   05/19/15 Diagnosis 1. Breast, nipple and areola, right - BENIGN BREAST TISSUE. - THERE IS NO EVIDENCE OF MALIGNANCY. 2. Breast, nipple and areola, left - BENIGN BREAST TISSUE. - THERE IS NO EVIDENCE OF MALIGNANCY. 3. Breast, simple mastectomy, right - FIBROADENOMA(S). - RADIAL SCARS - FIBROCYSTIC CHANGE WITH ADENOSIS AND CALCIFICATIONS. - USUAL DUCTAL HYPERPLASIA. - COLLAGENOUS SPHERULOSIS. - THERE IS NO EVIDENCE OF MALIGNANCY. 4. Breast, excision, right additional anterior margin - RADIAL SCAR(S). - FIBROADENOMA. - FIBROCYSTIC CHANGES WITH ADENOSIS AND CALCIFICATIONS. - THERE IS NO EVIDENCE OF MALIGNANCY. 5. Lymph node, sentinel, biopsy, left axillary - THERE IS NO EVIDENCE OF CARCINOMA IN 1 OF 1 LYMPH NODE (0/1). 6. Breast, simple mastectomy, left - RADIAL SCAR(S) - FIBROADENOMA. - FIBROCYSTIC CHANGES WITH ADENOSIS AND CALCIFICATIONS - USUAL DUCTAL HYPERPLASIA - HEALING BIOPSY SITE(S). - THERE IS NO EVIDENCE OF MALIGNANCY - SEE COMMENT. 7. Lymph node, sentinel, biopsy, possible left axillary - THERE IS NO EVIDENCE OF CARCINOMA IN 1 OF 1 LYMPH NODE (0/1).  10/08/14  Receptor Status: ER(0%), PR (0%), Her2-neu (positive)  Did patient present with symptoms (if so, please note symptoms) or was this found on screening mammography?: Patient palpated her lymph node  Past/Anticipated interventions by surgeon, if any: 05/19/15 - Procedure: BILATERAL NIPPLE SPARING MASTECTOMY WITH LEFT AXILLARY SENTINAL LYMPH NODE BIOPSY;  Surgeon: Thomas Cornett, MD;  Location: [facility];  Service: General;  Laterality: Bilateral; and Procedure: BILATERAL NIPPLE SPARING MASTECTOMY WITH LEFT AXILLARY SENTINAL LYMPH NODE BIOPSY;  Surgeon: Thomas Cornett, MD;  Location: [facility];  Service: General;  Laterality: Bilateral;  Past/Anticipated interventions by medical oncology, if any: Neo  adjuvant chemotherapy with [facility] for general every 3 weeks 6 cycles started 11/01/2014 completed 02/14/2015.  She is getting herceptin q 3 weeks for a year.  Lymphedema issues, if any:  no   Pain issues, if any:  yes has some tightness in her chest from surgery.  Rating it at a 2/10 and takes tylenol and advil PRN.  SAFETY ISSUES:  Prior radiation? no  Pacemaker/ICD? no  Possible current pregnancy?no  Is the patient on methotrexate? no  Current Complaints / other details:  Patient is here with her husband.  BP 102/76 mmHg  Pulse 80  Temp(Src) 97.9 F (36.6 C) (Oral)  Resp 16  Ht 5' 0.5" (1.537 m)  Wt 110 lb 3.2 oz (49.986 kg)  BMI 21.16 kg/m2   

## 2015-06-16 NOTE — Progress Notes (Unsigned)
This encounter was created in error - please disregard.

## 2015-06-17 ENCOUNTER — Ambulatory Visit: Payer: BLUE CROSS/BLUE SHIELD

## 2015-06-17 DIAGNOSIS — Z9189 Other specified personal risk factors, not elsewhere classified: Secondary | ICD-10-CM

## 2015-06-17 DIAGNOSIS — M25611 Stiffness of right shoulder, not elsewhere classified: Secondary | ICD-10-CM

## 2015-06-17 DIAGNOSIS — M25612 Stiffness of left shoulder, not elsewhere classified: Secondary | ICD-10-CM

## 2015-06-17 DIAGNOSIS — C50412 Malignant neoplasm of upper-outer quadrant of left female breast: Secondary | ICD-10-CM

## 2015-06-17 NOTE — Therapy (Signed)
[place], [place], [address] Phone: [phone]   Fax:  [phone]  Physical Therapy Treatment  Patient Details  Name: [name] [name] MRN: [id number] Date of Birth: [birth year] Referring Provider: Harlow Mares   Encounter Date: 06/17/2015      PT End of Session - 06/17/15 1022    Visit Number 2   Number of Visits 9   Date for PT Re-Evaluation 07/14/15   PT Start Time 0936   PT Stop Time 1021   PT Time Calculation (min) 45 min   Activity Tolerance Patient tolerated treatment well   Behavior During Therapy [facility] for tasks assessed/performed      Past Medical History  Diagnosis Date  . Osteopenia   . Neurogenic bladder   . Wears contact lenses   . Family history of breast cancer   . Family history of colon cancer   . Breast cancer of upper-outer quadrant of right female breast ([place]) 10/11/2014  . Breast cancer ([place]) 2016    ER-/PR-/Her2+  . Migraines     takes metoprolol  for migraine headaches    Past Surgical History  Procedure Laterality Date  . Vein ligation      both legs  . Colonoscopy    . Upper gi endoscopy    . Portacath placement Right 10/29/2014    Procedure: INSERTION PORT-A-CATH WITH ULTRASOUND;  Surgeon: Erroll Luna, MD;  Location: [place];  Service: General;  Laterality: Right;  . Breast surgery      rt, breast biopsy  . Nipple sparing mastectomy/sentinal lymph node biopsy/reconstruction/placement of tissue expander Bilateral 05/19/2015    Procedure: BILATERAL NIPPLE SPARING MASTECTOMY WITH LEFT AXILLARY SENTINAL LYMPH NODE BIOPSY;  Surgeon: Erroll Luna, MD;  Location: [place];  Service: General;  Laterality: Bilateral;  . Breast reconstruction with placement of tissue expander and flex hd (acellular hydrated dermis) Bilateral 05/19/2015    Procedure: BILATERAL NIPPLE SPARING MASTECTOMY WITH LEFT AXILLARY SENTINAL LYMPH NODE BIOPSY;  Surgeon: Erroll Luna, MD;   Location: [place];  Service: General;  Laterality: Bilateral;    There were no vitals filed for this visit.  Visit Diagnosis:  Shoulder stiffness, left  Shoulder stiffness, right  Carcinoma of upper-outer quadrant of left female breast ([place])  At risk for lymphedema      Subjective Assessment - 06/17/15 0944    Subjective Have a little bit of redness at bil trunk areas where my bra is, think it's just irritated from the bra. Having alot of tightness in the middle of my chest today from the surgery.   Currently in Pain? Yes   Pain Score 5    Pain Location Chest   Pain Descriptors / Indicators Burning;Tightness   Pain Type Surgical pain   Pain Onset 1 to 4 weeks ago   Pain Frequency Occasional   Aggravating Factors  end range of motion stretching   Pain Relieving Factors rest                         [facility] Adult PT Treatment/Exercise - 06/17/15 0001    Shoulder Exercises: Supine   Other Supine Exercises Meeks Decompression 5 reps 5 seconds each   Manual Therapy   Passive ROM In Supine to bil shoulders into flexion and abduction to pts tolerance.                 PT Education - 06/17/15 1022    Education provided  Yes   Education Details Meeks Decompression and to continue dowel rod exercises   Person(s) Educated Patient   Methods Explanation;Demonstration;Handout   Comprehension Verbalized understanding;Returned demonstration           Short Term Clinic Goals - 06/13/15 1307    CC Short Term Goal  #1   Title Short term goals = long term goals             Long Term Clinic Goals - 06/13/15 1307    CC Long Term Goal  #1   Title Patient will improve left shoulder flexion range of motion to 125 degrees so reach up in her kitchen cabinets (07/14/2015)   Baseline 109   Time 4   Period Weeks   Status New   CC Long Term Goal  #2   Title Patient will improve right shoulder flexion range of motion to 125 degrees to perform activities of daily  living and household chores with greater ease(07/14/2015)   Baseline 104   Time 4   Period Weeks   CC Long Term Goal  #3   Title Patient will improve left shoulder abduction to 120 degrees so that she can wash her hair with greater ease  (07/14/2015)   Baseline 84   Time 4   Period Weeks   Status New   CC Long Term Goal  #4   Title Patient will improve right shoulder abduction to 120 degrees so that she can wash her hair with greater ease (07/14/2015)   Baseline 98   Time 4   Period Weeks   Status New   CC Long Term Goal  #5   Title Patient will be independent in a basic advanced home exercise program (07/14/2015)   Time 4   Period Weeks   Status New            Plan - 06/17/15 1027    Clinical Impression Statement Pt accompanied by her friend today. She did very well with the Meeks exercises today reporting this helped alleviate some of the tightness she was feeling in her chest. Tolerated PROM of bil shoulders gently and she had good visible increases of end range by end of session bilaterally and pt reported feeling good after session.   Pt will benefit from skilled therapeutic intervention in order to improve on the following deficits Decreased range of motion;Postural dysfunction   Rehab Potential Excellent   Clinical Impairments Affecting Rehab Potential continues with target therapy through IV every 3 weeks  Activity restriction with no massage.    PT Frequency 2x / week   PT Duration 4 weeks   PT Treatment/Interventions Therapeutic exercise;Patient/family education   PT Next Visit Plan Review Meeks decompression prn; continue shoulder PROM and progress scapular and shoulder ROM HEP.    PT Home Exercise Plan see education section   Consulted and Agree with Plan of Care Patient        Problem List Patient Active Problem List   Diagnosis Date Noted  . Breast cancer, female, left 05/19/2015  . Breast cancer in female [facility]) 05/19/2015  . Nausea without vomiting  12/30/2014  . Diarrhea 12/30/2014  . Dehydration 12/30/2014  . Hypotension 12/30/2014  . 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 External hemorrhoid 12/13/2014  . UTI (urinary tract infection) 11/16/2014  . Genetic testing 11/07/2014  . Migraine headache 11/06/2014  . Family history of breast cancer   . Family history of colon cancer   . Breast cancer of upper-outer quadrant  of left female breast ([place]) 10/11/2014    Otelia Limes, PTA 06/17/2015, 10:36 AM  [place], [place], [address] Phone: [phone]   Fax:  [phone]  Name: [name] [name] MRN: [id number] Date of Birth: 1960-07-04

## 2015-06-19 ENCOUNTER — Encounter: Payer: Self-pay | Admitting: Physical Therapy

## 2015-06-19 ENCOUNTER — Ambulatory Visit (HOSPITAL_BASED_OUTPATIENT_CLINIC_OR_DEPARTMENT_OTHER): Payer: BLUE CROSS/BLUE SHIELD

## 2015-06-19 ENCOUNTER — Other Ambulatory Visit: Payer: Self-pay | Admitting: *Deleted

## 2015-06-19 ENCOUNTER — Ambulatory Visit: Payer: BLUE CROSS/BLUE SHIELD | Admitting: Physical Therapy

## 2015-06-19 ENCOUNTER — Ambulatory Visit (HOSPITAL_BASED_OUTPATIENT_CLINIC_OR_DEPARTMENT_OTHER): Payer: BLUE CROSS/BLUE SHIELD | Admitting: Hematology and Oncology

## 2015-06-19 ENCOUNTER — Encounter: Payer: Self-pay | Admitting: Hematology and Oncology

## 2015-06-19 VITALS — BP 81/65 | HR 70 | Temp 97.5°F | Resp 18 | Wt 107.0 lb

## 2015-06-19 VITALS — BP 103/60

## 2015-06-19 DIAGNOSIS — C50412 Malignant neoplasm of upper-outer quadrant of left female breast: Secondary | ICD-10-CM

## 2015-06-19 DIAGNOSIS — Z171 Estrogen receptor negative status [ER-]: Secondary | ICD-10-CM

## 2015-06-19 DIAGNOSIS — M25612 Stiffness of left shoulder, not elsewhere classified: Secondary | ICD-10-CM | POA: Diagnosis not present

## 2015-06-19 DIAGNOSIS — L259 Unspecified contact dermatitis, unspecified cause: Secondary | ICD-10-CM

## 2015-06-19 DIAGNOSIS — Z5112 Encounter for antineoplastic immunotherapy: Secondary | ICD-10-CM

## 2015-06-19 DIAGNOSIS — C773 Secondary and unspecified malignant neoplasm of axilla and upper limb lymph nodes: Secondary | ICD-10-CM | POA: Diagnosis not present

## 2015-06-19 DIAGNOSIS — Z9189 Other specified personal risk factors, not elsewhere classified: Secondary | ICD-10-CM

## 2015-06-19 DIAGNOSIS — L239 Allergic contact dermatitis, unspecified cause: Secondary | ICD-10-CM | POA: Insufficient documentation

## 2015-06-19 DIAGNOSIS — M25611 Stiffness of right shoulder, not elsewhere classified: Secondary | ICD-10-CM

## 2015-06-19 DIAGNOSIS — D6481 Anemia due to antineoplastic chemotherapy: Secondary | ICD-10-CM

## 2015-06-19 DIAGNOSIS — T451X5A Adverse effect of antineoplastic and immunosuppressive drugs, initial encounter: Principal | ICD-10-CM

## 2015-06-19 MED ORDER — SODIUM CHLORIDE 0.9 % IV SOLN
Freq: Once | INTRAVENOUS | Status: AC
  Administered 2015-06-19: 10:00:00 via INTRAVENOUS

## 2015-06-19 MED ORDER — HEPARIN SOD (PORK) LOCK FLUSH 100 UNIT/ML IV SOLN
500.0000 [IU] | Freq: Once | INTRAVENOUS | Status: AC | PRN
  Administered 2015-06-19: 500 [IU]
  Filled 2015-06-19: qty 5

## 2015-06-19 MED ORDER — ACETAMINOPHEN 325 MG PO TABS: 650.0000 mg | ORAL_TABLET | Freq: Once | ORAL | Status: DC

## 2015-06-19 MED ORDER — SODIUM CHLORIDE 0.9 % IJ SOLN
10.0000 mL | INTRAMUSCULAR | Status: DC | PRN
  Administered 2015-06-19: 10 mL
  Filled 2015-06-19: qty 10

## 2015-06-19 MED ORDER — METHYLPREDNISOLONE 4 MG PO TBPK: ORAL_TABLET | ORAL | Status: DC

## 2015-06-19 MED ORDER — DIPHENHYDRAMINE HCL 25 MG PO CAPS: 50.0000 mg | ORAL_CAPSULE | Freq: Once | ORAL | Status: DC

## 2015-06-19 MED ORDER — TRASTUZUMAB CHEMO INJECTION 440 MG
6.0000 mg/kg | Freq: Once | INTRAVENOUS | Status: AC
  Administered 2015-06-19: 294 mg via INTRAVENOUS
  Filled 2015-06-19: qty 14

## 2015-06-19 NOTE — Assessment & Plan Note (Signed)
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:00 position, left axillary lymph node palpable, ER/PR negative, 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 5.32, T1 cN1 M0 stage II a clinical stage Treatment summary: Neo adjuvant chemotherapy with [facility] for general every 3 weeks 6 cycles started 11/01/2014 completed 02/14/2015 Post-neo-adjuvant breast MRI 02/17/2015: No residual mass or abnormal enhancement, no enlarged lymph nodes Post-neo-adjuvant CT chest 02/18/2015: Interval resolution of the left breast mass and multiple enlarged left axillary and subpectoral lymph nodes Bilateral mastectomies 05/19/2015 Left: Radial scar, FA, UDH no malignancy 0/1 lymph node negative; right: FA, radial scar, UDH no malignancy (pathologic complete response) [id number] ---------------------------------------------------------------------------------------------------------------------------------------------------- Current treatment: Herceptin maintenance Patient will start adjuvant radiation therapy  Chemotherapy-induced anemia: Rash on the chest:

## 2015-06-19 NOTE — Progress Notes (Signed)
Patient Care Team: Laray Anger, MD as PCP - General (Obstetrics and Gynecology) Erroll Luna, MD as Consulting Physician (General Surgery) Nicholas Lose, MD as Consulting Physician (Hematology and Oncology) Gery Pray, MD as Consulting Physician (Radiation Oncology) Mauro Kaufmann, RN as Registered Nurse Rockwell Germany, RN as Registered Nurse  DIAGNOSIS: Breast cancer of upper-outer quadrant of left female breast [facility])   Staging form: Breast, AJCC 7th Edition     Clinical stage from 10/16/2014: Stage IIA (T1c, N1, M0) - Unsigned   SUMMARY OF ONCOLOGIC HISTORY:   Breast cancer of upper-outer quadrant of left female breast ([place])   10/01/2014 Mammogram Left breast mammogram and ultrasound for palpable left breast mass which was a maxillary mass with an ill-defined abnormality by ultrasound 1.7 cm at 2:00   10/11/2014 Initial Diagnosis Left breast invasive ductal carcinoma, grade 3, ER/PR negative, 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 5.32, axillary lymph node also positive for cancer   10/30/2014 Imaging CT chest abdomen pelvis and bone scan revealed no evidence of distant metastatic disease but enlarged left axillary and left subpectoral lymph nodes   11/01/2014 -  Neo-Adjuvant Chemotherapy Neoadjuvant TCH [place] to 3 weeks 6 cycles followed by Herceptin maintenance   02/17/2015 Breast MRI No residual mass or abnormal enhancement, no enlarged lymph nodes   05/19/2015 Surgery Bilateral mastectomies with immediate reconstruction Left: Radial scar, FA, UDH no malignancy 0/1 lymph node negative; right: FA, radial scar, UV H no malignancy [id number]    CHIEF COMPLIANT: rash on the lateral aspect of trunk bilaterally  INTERVAL HISTORY: [name] [name] is a 54 year old with above-mentioned history of left breast cancer treated with neoadjuvant chemotherapy and had a complete pathologic response. She underwent bilateral mastectomies with immediate reconstruction. She is currently on Herceptin maintenance. She went  for physical therapy for range of motion. At that time she was noted to have a rash in the lateral aspect of the trunk bilaterally. There were some discussions with her surgeon as well as a Psychiatric nurse but they were not sure what the rash is from and deferred management to [place].  REVIEW OF SYSTEMS:   Constitutional: Denies fevers, chills or abnormal weight loss Eyes: Denies blurriness of vision Ears, nose, mouth, throat, and face: Denies mucositis or sore throat Respiratory: Denies cough, dyspnea or wheezes Cardiovascular: Denies palpitation, chest discomfort Gastrointestinal:  Denies nausea, heartburn or change in bowel habits Skin: skin rash beneath the reconstructed breast extending into the axilla on both sides Lymphatics: Denies new lymphadenopathy or easy bruising Neurological:Denies numbness, tingling or new weaknesses Behavioral/Psych: Mood is stable, no new changes  Extremities: No lower extremity edema Breast: breast reconstruction undergoing All other systems were reviewed with the patient and are negative.  I have reviewed the past medical history, past surgical history, social history and family history with the patient and they are unchanged from previous note.  ALLERGIES:  is allergic to meperidine; stadol; zofran; and sulfa antibiotics.  MEDICATIONS:  Current Outpatient Prescriptions  Medication Sig Dispense Refill  . acetaminophen (TYLENOL) 325 MG tablet Take 650 mg by mouth every 6 (six) hours as needed for mild pain.     Marland Kitchen alclomethasone (ACLOVATE) 0.05 % cream   0  . atenolol (TENORMIN) 25 MG tablet Take 75 mg by mouth daily. For migraines not blood pressure    . Calcium Carbonate (CALCIUM 600 PO) Take 1 tablet by mouth daily.     . cholecalciferol (VITAMIN D) 1000 UNITS tablet Take 1,000 Units by mouth daily.    Marland Kitchen  docusate sodium (COLACE) 100 MG capsule Take 100 mg by mouth 2 (two) times daily as needed for mild constipation.    Marland Kitchen doxycycline (VIBRA-TABS) 100 MG  tablet Take 1 tablet (100 mg total) by mouth every 12 (twelve) hours. 30 tablet 0  . eletriptan (RELPAX) 40 MG tablet Take 1 tablet (40 mg total) by mouth every 2 (two) hours as needed for migraine or headache (Max 2 per day). 10 tablet 3  . enoxaparin (LOVENOX) 40 MG/0.4ML injection Inject 0.4 mLs (40 mg total) into the skin daily. 12 Syringe 0  . hydrocortisone 2.5 % ointment Apply topically 2 (two) times daily. (Patient taking differently: Apply 1 application topically 2 (two) times daily as needed. ) 30 g 0  . HYDROmorphone (DILAUDID) 2 MG tablet Take 1-2 tablets (2-4 mg total) by mouth every 4 (four) hours as needed for moderate pain. 40 tablet 0  . Lactobacillus (ACIDOPHILUS PO) Take 1 capsule by mouth daily.     Marland Kitchen lidocaine-prilocaine (EMLA) cream Apply to affected area once (Patient taking differently: Apply 1 application topically as needed. Apply to affected area once) 30 g 3  . methocarbamol (ROBAXIN) 500 MG tablet Take 1 tablet (500 mg total) by mouth every 6 (six) hours as needed for muscle spasms. 40 tablet 1  . PARoxetine (PAXIL) 20 MG tablet Take 20 mg by mouth.    . promethazine (PHENERGAN) 12.5 MG tablet Take 1 tablet (12.5 mg total) by mouth every 6 (six) hours as needed for nausea or vomiting. 90 tablet 3  . solifenacin (VESICARE) 5 MG tablet Take 5 mg by mouth.     No current facility-administered medications for this visit.    PHYSICAL EXAMINATION: ECOG PERFORMANCE STATUS: 1 - Symptomatic but completely ambulatory  There were no vitals filed for this visit. There were no vitals filed for this visit.  GENERAL:alert, no distress and comfortable SKIN: skin rash underneath bilateral reconstructed breast extending into the axilla it is maculopapular in nature it does not have the characteristic appearance of shingles. EYES: normal, Conjunctiva are pink and non-injected, sclera clear OROPHARYNX:no exudate, no erythema and lips, buccal mucosa, and tongue normal  NECK: supple,  thyroid normal size, non-tender, without nodularity LYMPH:  no palpable lymphadenopathy in the cervical, axillary or inguinal LUNGS: clear to auscultation and percussion with normal breathing effort HEART: regular rate & rhythm and no murmurs and no lower extremity edema ABDOMEN:abdomen soft, non-tender and normal bowel sounds MUSCULOSKELETAL:no cyanosis of digits and no clubbing  NEURO: alert & oriented x 3 with fluent speech, no focal motor/sensory deficits EXTREMITIES: No lower extremity edema  LABORATORY DATA:  I have reviewed the data as listed   Chemistry      Component Value Date/Time   NA 140 05/29/2015 0843   NA 130* 05/20/2015 0300   K 4.1 05/29/2015 0843   K 3.3* 05/20/2015 0300   CL 98* 05/20/2015 0300   CO2 29 05/29/2015 0843   CO2 25 05/20/2015 0300   BUN 14.3 05/29/2015 0843   BUN 9 05/20/2015 0300   CREATININE 0.8 05/29/2015 0843   CREATININE 0.80 05/20/2015 0300      Component Value Date/Time   CALCIUM 10.2 05/29/2015 0843   CALCIUM 7.8* 05/20/2015 0300   ALKPHOS 157* 05/29/2015 0843   ALKPHOS 58 05/20/2015 0300   AST 33 05/29/2015 0843   AST 37 05/20/2015 0300   ALT 20 05/29/2015 0843   ALT 19 05/20/2015 0300   BILITOT <0.30 05/29/2015 0843   BILITOT 0.5 05/20/2015  0300       Lab Results  Component Value Date   WBC 6.3 05/29/2015   HGB 11.0* 05/29/2015   HCT 33.3* 05/29/2015   MCV 85.7 05/29/2015   PLT 370 05/29/2015   NEUTROABS 4.0 05/29/2015     ASSESSMENT & PLAN:  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:00 position, left axillary lymph node palpable, ER/PR negative, 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 5.32, T1 cN1 M0 stage II a clinical stage Treatment summary: Neo adjuvant chemotherapy with [facility] for general every 3 weeks 6 cycles started 11/01/2014 completed 02/14/2015 Post-neo-adjuvant breast MRI 02/17/2015: No residual mass or abnormal enhancement, no enlarged lymph  nodes Post-neo-adjuvant CT chest 02/18/2015: Interval resolution of the left breast mass and multiple enlarged left axillary and subpectoral lymph nodes Bilateral mastectomies 05/19/2015 Left: Radial scar, FA, UDH no malignancy 0/1 lymph node negative; right: FA, radial scar, UDH no malignancy (pathologic complete response) [id number] ---------------------------------------------------------------------------------------------------------------------------------------------------- Current treatment: Herceptin maintenance Patient will start adjuvant radiation therapy  Chemotherapy-induced anemia: monitoring Rash underneath the breasts bilaterally extending into the axilla: his most certainly contact dermatitis. I will prescribe her Medrol Dosepak. I instructed her to call us next week. If it does not get better then we might have to treat her with acyclovir.  Return to clinic in 3 weeks for Herceptin. No orders of the defined types were placed in this encounter.   The patient has a good understanding of the overall plan. she agrees with it. she will call with any problems that may develop before the next visit here.   Rulon Eisenmenger, MD 06/19/2015

## 2015-06-19 NOTE — Therapy (Signed)
[facility] [place], [place], [address] Phone: [phone]   Fax:  [phone]  Physical Therapy Treatment  Patient Details  Name: [name] [name] MRN: [id number] Date of Birth: March 15, 1961 Referring Provider: Harlow Mares   Encounter Date: 06/19/2015      PT End of Session - 06/19/15 0849    Visit Number 3   Number of Visits 9   Date for PT Re-Evaluation 07/14/15   PT Start Time 0758   PT Stop Time 0850   PT Time Calculation (min) 52 min   Activity Tolerance Patient tolerated treatment well   Behavior During Therapy [facility] for tasks assessed/performed      Past Medical History  Diagnosis Date  . Osteopenia   . Neurogenic bladder   . Wears contact lenses   . Family history of breast cancer   . Family history of colon cancer   . Breast cancer of upper-outer quadrant of right female breast ([place]) 10/11/2014  . Breast cancer ([place]) 2016    ER-/PR-/Her2+  . Migraines     takes metoprolol  for migraine headaches    Past Surgical History  Procedure Laterality Date  . Vein ligation      both legs  . Colonoscopy    . Upper gi endoscopy    . Portacath placement Right 10/29/2014    Procedure: INSERTION PORT-A-CATH WITH ULTRASOUND;  Surgeon: Erroll Luna, MD;  Location: [place];  Service: General;  Laterality: Right;  . Breast surgery      rt, breast biopsy  . Nipple sparing mastectomy/sentinal lymph node biopsy/reconstruction/placement of tissue expander Bilateral 05/19/2015    Procedure: BILATERAL NIPPLE SPARING MASTECTOMY WITH LEFT AXILLARY SENTINAL LYMPH NODE BIOPSY;  Surgeon: Erroll Luna, MD;  Location: [place];  Service: General;  Laterality: Bilateral;  . Breast reconstruction with placement of tissue expander and flex hd (acellular hydrated dermis) Bilateral 05/19/2015    Procedure: BILATERAL NIPPLE SPARING MASTECTOMY WITH LEFT AXILLARY SENTINAL LYMPH NODE BIOPSY;  Surgeon: Erroll Luna, MD;   Location: [place];  Service: General;  Laterality: Bilateral;    There were no vitals filed for this visit.  Visit Diagnosis:  Shoulder stiffness, left  Shoulder stiffness, right  Carcinoma of upper-outer quadrant of left female breast ([place])  At risk for lymphedema      Subjective Assessment - 06/19/15 0807    Subjective I have a rash on my chest but I saw a PA yesterday and he said he thinks it's contact dermatitis.  I don't have any pain.   Pertinent History Diagnosed 10/03/14 with left ER/PR negative, HER2 positive breast cancer.  She also has a positive left axillary node. Bilateral mastectomy with 2 nodes removed from the left side, none on the right.on Nov. 21 with immedicate silicone implants. Has had chemotherapy and will continue with target therapy till June and will see about radition therapy in  January.     Patient Stated Goals Improve shoulder ROM   Currently in Pain? No/denies            [facility] PT Assessment - 06/19/15 0001    AROM   Right Shoulder Flexion 112 Degrees   Right Shoulder ABduction 127 Degrees   Left Shoulder Flexion 109 Degrees   Left Shoulder ABduction 124 Degrees                     [facility] Adult PT Treatment/Exercise - 06/19/15 0001    Shoulder Exercises: Standing  Retraction 10 reps  Verbal cues for posture   Other Standing Exercises Standing with back against wall trying to get shoulders against wall and achieve scapular retraction x30 seconds x3   Shoulder Exercises: Pulleys   Flexion 2 minutes   ABduction 2 minutes   Shoulder Exercises: Therapy Ball   Flexion 10 reps   ABduction 10 reps   Manual Therapy   Manual therapy comments Passive BUE neural stretching to tolerance   Passive ROM In Supine to bil shoulders into flexion and abduction to pts tolerance.                 PT Education - 06/19/15 [phone]    Education provided Yes   Education Details Standing with back against wall trying to achieve upright posture;  laying diagonal on bed with bil shoulder abduction to stretch anterior chest and nerves.   Person(s) Educated Patient   Methods Explanation;Demonstration;Verbal cues   Comprehension Returned demonstration;Verbalized understanding           Short Term Clinic Goals - 06/13/15 1307    CC Short Term Goal  #1   Title Short term goals = long term goals             Long Term Clinic Goals - 06/13/15 1307    CC Long Term Goal  #1   Title Patient will improve left shoulder flexion range of motion to 125 degrees so reach up in her kitchen cabinets (07/14/2015)   Baseline 109   Time 4   Period Weeks   Status New   CC Long Term Goal  #2   Title Patient will improve right shoulder flexion range of motion to 125 degrees to perform activities of daily living and household chores with greater ease(07/14/2015)   Baseline 104   Time 4   Period Weeks   CC Long Term Goal  #3   Title Patient will improve left shoulder abduction to 120 degrees so that she can wash her hair with greater ease  (07/14/2015)   Baseline 84   Time 4   Period Weeks   Status New   CC Long Term Goal  #4   Title Patient will improve right shoulder abduction to 120 degrees so that she can wash her hair with greater ease (07/14/2015)   Baseline 98   Time 4   Period Weeks   Status New   CC Long Term Goal  #5   Title Patient will be independent in a basic advanced home exercise program (07/14/2015)   Time 4   Period Weeks   Status New            Plan - 06/19/15 9323    Clinical Impression Statement Patient is doing very well with shoulder ROM.  All motions are significantly improved except left shoulder flexion which she will work on.  Her rash is mostly on the lateral aspect of her trunk bilaterally.  It does not appear to be an infection but she was encouraged to discuss with her MD at her Herceptin infusion today.  She will benefit from continued PT to increase shoulder ROM. and improve posture.   Pt will benefit  from skilled therapeutic intervention in order to improve on the following deficits Decreased range of motion;Postural dysfunction   Rehab Potential Excellent   Clinical Impairments Affecting Rehab Potential continues with target therapy through IV every 3 weeks  Activity restriction with no massage.    PT Frequency 2x / week   PT  Duration 4 weeks   PT Treatment/Interventions Therapeutic exercise;Patient/family education   PT Next Visit Plan Continue with focus on postural exercises and shoulder ROM.  Ask about rash and if she has improved.   Consulted and Agree with Plan of Care Patient        Problem List Patient Active Problem List   Diagnosis Date Noted  . Breast cancer, female, left 05/19/2015  . Breast cancer in female [facility]) 05/19/2015  . Nausea without vomiting 12/30/2014  . Diarrhea 12/30/2014  . Dehydration 12/30/2014  . Hypotension 12/30/2014  . 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 External hemorrhoid 12/13/2014  . UTI (urinary tract infection) 11/16/2014  . Genetic testing 11/07/2014  . Migraine headache 11/06/2014  . Family history of breast cancer   . Family history of colon cancer   . Breast cancer of upper-outer quadrant of left female breast ([place]) 10/11/2014   Annia Friendly, PT 06/19/2015 8:55 AM  [place], [place], [address] Phone: [phone]   Fax:  [phone]  Name: MUSLIMA TOPPINS MRN: [id number] Date of Birth: September 21, 1960

## 2015-06-19 NOTE — Patient Instructions (Signed)
[facility] Discharge Instructions for Patients Receiving Chemotherapy  Today you received the following chemotherapy agents Herceptin  To help prevent nausea and vomiting after your treatment, we encourage you to take your nausea medication    If you develop nausea and vomiting that is not controlled by your nausea medication, call the clinic.   BELOW ARE SYMPTOMS THAT SHOULD BE REPORTED IMMEDIATELY:  *FEVER GREATER THAN 100.5 F  *CHILLS WITH OR WITHOUT FEVER  NAUSEA AND VOMITING THAT IS NOT CONTROLLED WITH YOUR NAUSEA MEDICATION  *UNUSUAL SHORTNESS OF BREATH  *UNUSUAL BRUISING OR BLEEDING  TENDERNESS IN MOUTH AND THROAT WITH OR WITHOUT PRESENCE OF ULCERS  *URINARY PROBLEMS  *BOWEL PROBLEMS  UNUSUAL RASH Items with * indicate a potential emergency and should be followed up as soon as possible.  Feel free to call the clinic you have any questions or concerns. The clinic phone number is [phone].  Please show the CHEMO ALERT CARD at check-in to the Emergency Department and triage nurse.   

## 2015-06-25 ENCOUNTER — Ambulatory Visit: Payer: BLUE CROSS/BLUE SHIELD | Admitting: Physical Therapy

## 2015-06-25 DIAGNOSIS — M25612 Stiffness of left shoulder, not elsewhere classified: Secondary | ICD-10-CM

## 2015-06-25 DIAGNOSIS — M25611 Stiffness of right shoulder, not elsewhere classified: Secondary | ICD-10-CM

## 2015-06-25 DIAGNOSIS — Z9189 Other specified personal risk factors, not elsewhere classified: Secondary | ICD-10-CM

## 2015-06-25 NOTE — Therapy (Signed)
[facility] [address] [place], [place], [address] Phone: [phone]   Fax:  [phone]  Physical Therapy Treatment  Patient Details  Name: [name] [name] MRN: [id number] Date of Birth: 05/20/1961 Referring Provider: Bowers   Encounter Date: 06/25/2015      PT End of Session - 06/25/15 1332    Visit Number 4   Number of Visits 9   Date for PT Re-Evaluation 07/14/15   PT Start Time 0930   PT Stop Time 1015   PT Time Calculation (min) 45 min   Activity Tolerance Patient tolerated treatment well   Behavior During Therapy [facility] for tasks assessed/performed      Past Medical History  Diagnosis Date  . Osteopenia   . Neurogenic bladder   . Wears contact lenses   . Family history of breast cancer   . Family history of colon cancer   . 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 Breast cancer ([facility]) 2016    ER-/PR-/Her2+  . Migraines     takes metoprolol  for migraine headaches    Past Surgical History  Procedure Laterality Date  . Vein ligation      both legs  . Colonoscopy    . Upper gi endoscopy    . Portacath placement Right 10/29/2014    Procedure: INSERTION PORT-A-CATH WITH ULTRASOUND;  Surgeon: Thomas Cornett, MD;  Location: [facility];  Service: General;  Laterality: Right;  . Breast surgery      rt, breast biopsy  . Nipple sparing mastectomy/sentinal lymph node biopsy/reconstruction/placement of tissue expander Bilateral 05/19/2015    Procedure: BILATERAL NIPPLE SPARING MASTECTOMY WITH LEFT AXILLARY SENTINAL LYMPH NODE BIOPSY;  Surgeon: Thomas Cornett, MD;  [facility];  Service: General;  Laterality: Bilateral;  . Breast reconstruction with placement of tissue expander and flex hd (acellular hydrated dermis) Bilateral 05/19/2015    Procedure: BILATERAL NIPPLE SPARING MASTECTOMY WITH LEFT AXILLARY SENTINAL LYMPH NODE BIOPSY;  Surgeon: Thomas Cornett, MD;   Location[facility];  Service: General;  Laterality: Bilateral;    There were no vitals filed for this visit.  Visit Diagnosis:  Shoulder stiffness, left  Shoulder stiffness, right  At risk for lymphedema      Subjective Assessment - 06/25/15 0940    Subjective the rash is better. Pt states she was not doing well dec 25 and 26, started feeling better yesterday    Pertinent History Diagnosed 10/03/14 with left ER/PR negative, HER2 positive breast cancer.  She also has a positive left axillary node. Bilateral mastectomy with 2 nodes removed from the left side, none on the right.on Nov. 21 with immedicate silicone implants. Has had chemotherapy and will continue with target therapy till June and will see about radition therapy in  January.     Currently in Pain? Yes   Pain Score 3    Pain Location Breast   Pain Orientation Left   Pain Descriptors / Indicators Burning                         [facility] Adult PT Treatment/Exercise - 06/25/15 0001    Shoulder Exercises: Supine   Protraction AROM;Right;Left;5 reps   Horizontal ABduction AROM;5 reps;Theraband   Theraband Level (Shoulder Horizontal ABduction) Level 1 (Yellow)   External Rotation AROM;Both;Theraband   Theraband Level (Shoulder External Rotation) Level 1 (Yellow)   Flexion AROM;Both;5 reps   Theraband Level (Shoulder Flexion) Level 1 (  Yellow)   Shoulder Flexion Weight (lbs) wide and narrow grip    Manual Therapy   Manual therapy comments Passive BUE neural stretching to tolerance   Passive ROM In Supine to bil shoulders into flexion and abduction to pts tolerance.                    Short Term Clinic Goals - 06/13/15 1307    CC Short Term Goal  #1   Title Short term goals = long term goals             Long Term Clinic Goals - 06/13/15 1307    CC Long Term Goal  #1   Title Patient will improve left shoulder flexion range of motion to 125 degrees so reach up in her kitchen cabinets (07/14/2015)    Baseline 109   Time 4   Period Weeks   Status New   CC Long Term Goal  #2   Title Patient will improve right shoulder flexion range of motion to 125 degrees to perform activities of daily living and household chores with greater ease(07/14/2015)   Baseline 104   Time 4   Period Weeks   CC Long Term Goal  #3   Title Patient will improve left shoulder abduction to 120 degrees so that she can wash her hair with greater ease  (07/14/2015)   Baseline 84   Time 4   Period Weeks   Status New   CC Long Term Goal  #4   Title Patient will improve right shoulder abduction to 120 degrees so that she can wash her hair with greater ease (07/14/2015)   Baseline 98   Time 4   Period Weeks   Status New   CC Long Term Goal  #5   Title Patient will be independent in a basic advanced home exercise program (07/14/2015)   Time 4   Period Weeks   Status New            Plan - 06/25/15 1333    Clinical Impression Statement pt reports improvement in rash, but still has some small red dots around left lateral chest. She continues to improve in posture and shoulder range of motion with tightness and pain  with passisve stretch in extremes    Pt will benefit from skilled therapeutic intervention in order to improve on the following deficits Decreased range of motion;Postural dysfunction   Clinical Impairments Affecting Rehab Potential continues with target therapy through IV every 3 weeks  Activity restriction with no massage.    PT Next Visit Plan Continue with focus on postural exercises and shoulder ROM.  Remeasure for goal update         Problem List Patient Active Problem List   Diagnosis Date Noted  . Allergic dermatitis 06/19/2015  . Nausea without vomiting 12/30/2014  . Diarrhea 12/30/2014  . Dehydration 12/30/2014  . Hypotension 12/30/2014  . 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 External hemorrhoid 12/13/2014  . UTI (urinary tract infection) 11/16/2014  . Genetic  testing 11/07/2014  . Migraine headache 11/06/2014  . Family history of breast cancer   . Family history of colon cancer   . Breast cancer of upper-outer quadrant of left female breast ([place]) 10/11/2014   Donato Heinz. Owens Shark, PT  06/25/2015, 1:36 PM  [place], [place], [address] Phone: [phone]   Fax:  [phone]  Name: [name] [name] MRN: [id number] Date of Birth: 07/04/1960

## 2015-06-26 ENCOUNTER — Ambulatory Visit: Payer: BLUE CROSS/BLUE SHIELD

## 2015-06-26 DIAGNOSIS — M25612 Stiffness of left shoulder, not elsewhere classified: Secondary | ICD-10-CM

## 2015-06-26 DIAGNOSIS — M25611 Stiffness of right shoulder, not elsewhere classified: Secondary | ICD-10-CM

## 2015-06-26 NOTE — Therapy (Signed)
[place], [place], [address] Phone: [phone]   Fax:  [phone]  Physical Therapy Treatment  Patient Details  Name: [name] [name] MRN: [id number] Date of Birth: May 08, 1961 Referring Provider: Harlow Mares   Encounter Date: 06/26/2015      PT End of Session - 06/26/15 0850    Visit Number 5   Number of Visits 9   Date for PT Re-Evaluation 07/14/15   PT Start Time 0804   PT Stop Time 5885   PT Time Calculation (min) 43 min   Activity Tolerance Patient tolerated treatment well   Behavior During Therapy [facility] for tasks assessed/performed      Past Medical History  Diagnosis Date  . Osteopenia   . Neurogenic bladder   . Wears contact lenses   . Family history of breast cancer   . Family history of colon cancer   . Breast cancer of upper-outer quadrant of right female breast ([place]) 10/11/2014  . Breast cancer ([place]) 2016    ER-/PR-/Her2+  . Migraines     takes metoprolol  for migraine headaches    Past Surgical History  Procedure Laterality Date  . Vein ligation      both legs  . Colonoscopy    . Upper gi endoscopy    . Portacath placement Right 10/29/2014    Procedure: INSERTION PORT-A-CATH WITH ULTRASOUND;  Surgeon: Erroll Luna, MD;  Location: [place];  Service: General;  Laterality: Right;  . Breast surgery      rt, breast biopsy  . Nipple sparing mastectomy/sentinal lymph node biopsy/reconstruction/placement of tissue expander Bilateral 05/19/2015    Procedure: BILATERAL NIPPLE SPARING MASTECTOMY WITH LEFT AXILLARY SENTINAL LYMPH NODE BIOPSY;  Surgeon: Erroll Luna, MD;  Location: [place];  Service: General;  Laterality: Bilateral;  . Breast reconstruction with placement of tissue expander and flex hd (acellular hydrated dermis) Bilateral 05/19/2015    Procedure: BILATERAL NIPPLE SPARING MASTECTOMY WITH LEFT AXILLARY SENTINAL LYMPH NODE BIOPSY;  Surgeon: Erroll Luna, MD;   Location: [place];  Service: General;  Laterality: Bilateral;    There were no vitals filed for this visit.  Visit Diagnosis:  Shoulder stiffness, left  Shoulder stiffness, right      Subjective Assessment - 06/26/15 0806    Subjective I saw Dr. Lindi Adie last week and he put me on a steroid pack that I started Thursday an dI'll be finished with it this week. The rash is significantly better since then. Just have my usual chest tightness this morning and the burning is still intermittent but maybe slightly improved.    Currently in Pain? No/denies                         [facility] Adult PT Treatment/Exercise - 06/26/15 0001    Shoulder Exercises: Supine   Horizontal ABduction AROM;Strengthening;Both;10 reps;Theraband   Theraband Level (Shoulder Horizontal ABduction) Level 1 (Yellow)   Horizontal ABduction Weight (lbs) Cuing for correct technique to keep elbows straight and to go to end ROM.    External Rotation AROM;Strengthening;Both;10 reps;Theraband   Theraband Level (Shoulder External Rotation) Level 1 (Yellow)   Flexion AROM;Strengthening;Both;10 reps;Theraband  Narrow and wide grip   Theraband Level (Shoulder Flexion) Level 1 (Yellow)   Other Supine Exercises Bil D2 with yellow theraband 10 reps each   Manual Therapy   Manual therapy comments Passive BUE neural stretching to tolerance   Passive ROM In Supine to bil shoulders into  flexion and abduction to pts tolerance.                    Short Term Clinic Goals - 06/13/15 1307    CC Short Term Goal  #1   Title Short term goals = long term goals             Long Term Clinic Goals - 06/26/15 0857    CC Long Term Goal  #1   Title Patient will improve left shoulder flexion range of motion to 125 degrees so reach up in her kitchen cabinets (07/14/2015)   Status On-going   CC Long Term Goal  #2   Title Patient will improve right shoulder flexion range of motion to 125 degrees to perform activities  of daily living and household chores with greater ease(07/14/2015)   Status On-going   CC Long Term Goal  #3   Title Patient will improve left shoulder abduction to 120 degrees so that she can wash her hair with greater ease  (07/14/2015)   Status On-going   CC Long Term Goal  #4   Title Patient will improve right shoulder abduction to 120 degrees so that she can wash her hair with greater ease (07/14/2015)   Status On-going   CC Long Term Goal  #5   Title Patient will be independent in a basic advanced home exercise program (07/14/2015)  Advanced this this week.   Status On-going            Plan - 06/26/15 7556    Clinical Impression Statement Pt tolerated stretching well with some minimal pain at end ranges of Lt shoulder. Did well with review of HEP issued yesterday and pts tightness though still present is improving. Progressing well.   Pt will benefit from skilled therapeutic intervention in order to improve on the following deficits Decreased range of motion;Postural dysfunction   Rehab Potential Excellent   Clinical Impairments Affecting Rehab Potential continues with target therapy through IV every 3 weeks  Activity restriction with no massage.    PT Frequency 2x / week   PT Duration 4 weeks   PT Treatment/Interventions Therapeutic exercise;Patient/family education   PT Next Visit Plan Continue with focus on postural exercises and shoulder ROM.  Remeasure for goal update    Consulted and Agree with Plan of Care Patient        Problem List Patient Active Problem List   Diagnosis Date Noted  . Allergic dermatitis 06/19/2015  . Nausea without vomiting 12/30/2014  . Diarrhea 12/30/2014  . Dehydration 12/30/2014  . Hypotension 12/30/2014  . 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 External hemorrhoid 12/13/2014  . UTI (urinary tract infection) 11/16/2014  . Genetic testing 11/07/2014  . Migraine headache 11/06/2014  . Family history of breast cancer   .  Family history of colon cancer   . Breast cancer of upper-outer quadrant of left female breast ([place]) 10/11/2014    [name] [name], PTA 06/26/2015, 9:00 AM  [place], [place], [address] Phone: [phone]   Fax:  [phone]  Name: [name] [name] MRN: [id number] Date of Birth: 30-Nov-1960

## 2015-07-01 ENCOUNTER — Ambulatory Visit: Payer: BLUE CROSS/BLUE SHIELD | Attending: Plastic Surgery

## 2015-07-01 DIAGNOSIS — M25612 Stiffness of left shoulder, not elsewhere classified: Secondary | ICD-10-CM | POA: Diagnosis present

## 2015-07-01 DIAGNOSIS — Z9189 Other specified personal risk factors, not elsewhere classified: Secondary | ICD-10-CM

## 2015-07-01 DIAGNOSIS — C50412 Malignant neoplasm of upper-outer quadrant of left female breast: Secondary | ICD-10-CM | POA: Insufficient documentation

## 2015-07-01 DIAGNOSIS — M25611 Stiffness of right shoulder, not elsewhere classified: Secondary | ICD-10-CM

## 2015-07-01 NOTE — Patient Instructions (Signed)
Ball for roll up wall stretch in front and to the side keeping shoulders down. 10 reps, hold 3-5 seconds  Stand with back and shoulder blades against wall and slowly slide arms up wall until stretch at chest felt and arms can't stay on wall. Hold here 5 seconds and do 5- 10 reps. Don't push into pain!  CHEST: Doorway, Bilateral - Standing    Standing in doorway with 1 foot in front of the other, place hands on wall with elbows bent at shoulder height. Lean forward. Hold _10-20__ seconds. _3-5__ reps per set, 1-2___ sets per day.  Copyright  VHI. All rights reserved.   Cancer Rehab [phone]

## 2015-07-01 NOTE — Therapy (Signed)
[facility] [address] [place], [place], [address] Phone: [phone]   Fax:  [phone]  Physical Therapy Treatment  Patient Details  Name: [name] [name] MRN: [id number] Date of Birth: 05/18/1961 Referring Provider: Bowers   Encounter Date: 07/01/2015      PT End of Session - 07/01/15 1107    Visit Number 6   Number of Visits 9   Date for PT Re-Evaluation 07/14/15   PT Start Time 1022   PT Stop Time 1106   PT Time Calculation (min) 44 min   Activity Tolerance Patient tolerated treatment well   Behavior During Therapy WFL for tasks assessed/performed      Past Medical History  Diagnosis Date  . Osteopenia   . Neurogenic bladder   . Wears contact lenses   . Family history of breast cancer   . Family history of colon cancer   . 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 Breast cancer (HCC) 2016    ER-/PR-/Her2+  . Migraines     takes metoprolol  for migraine headaches    Past Surgical History  Procedure Laterality Date  . Vein ligation      both legs  . Colonoscopy    . Upper gi endoscopy    . Portacath placement Right 10/29/2014    Procedure: INSERTION PORT-A-CATH WITH ULTRASOUND;  Surgeon: Thomas Cornett, MD;  Location: [facility];  Service: General;  Laterality: Right;  . Breast surgery      rt, breast biopsy  . Nipple sparing mastectomy/sentinal lymph node biopsy/reconstruction/placement of tissue expander Bilateral 05/19/2015    Procedure: BILATERAL NIPPLE SPARING MASTECTOMY WITH LEFT AXILLARY SENTINAL LYMPH NODE BIOPSY;  Surgeon: Thomas Cornett, MD;  Location: [facility];  Service: General;  Laterality: Bilateral;  . Breast reconstruction with placement of tissue expander and flex hd (acellular hydrated dermis) Bilateral 05/19/2015    Procedure: BILATERAL NIPPLE SPARING MASTECTOMY WITH LEFT AXILLARY SENTINAL LYMPH NODE BIOPSY;  Surgeon: Thomas Cornett, MD;   Location: [facility];  Service: General;  Laterality: Bilateral;    There were no vitals filed for this visit.  Visit Diagnosis:  Shoulder stiffness, left  Shoulder stiffness, right  At risk for lymphedema      Subjective Assessment - 07/01/15 1032    Subjective My HEP is going well, doing that every day. My morning tightness though is still pretty intense to the point that I have trouble stretching first thing in the morning sometimes. But afternoon HEP is going better.    Currently in Pain? No/denies                         [facility] Adult PT Treatment/Exercise - 07/01/15 0001    Shoulder Exercises: Standing   Other Standing Exercises Standing with back against wall for bil UE abduction with as much of back and arms on wall as able 5 reps for 5 seconds   Shoulder Exercises: Pulleys   Flexion 2 minutes   ABduction 2 minutes   Shoulder Exercises: Therapy Ball   Flexion 10 reps  With forward lean for increased stretch at top   Shoulder Exercises: Stretch   Corner Stretch 5 reps;10 seconds  In doorway   Manual Therapy   Manual therapy comments Passive BUE neural stretching to tolerance   Passive ROM In Supine to bil shoulders into flexion and abduction to pts tolerance.                   PT Education - 07/01/15 1220    Education provided Yes   Education Details Standing with back against wall for bil UE abduction, doorway stretch and rolling ball up wall for flexion and abd.    Person(s) Educated Patient   Methods Explanation;Handout   Comprehension Verbalized understanding;Returned demonstration           Short Term Clinic Goals - 06/13/15 1307    CC Short Term Goal  #1   Title Short term goals = long term goals             Long Term Clinic Goals - 06/26/15 0857    CC Long Term Goal  #1   Title Patient will improve left shoulder flexion range of motion to 125 degrees so reach up in her kitchen cabinets (07/14/2015)   Status On-going   CC Long  Term Goal  #2   Title Patient will improve right shoulder flexion range of motion to 125 degrees to perform activities of daily living and household chores with greater ease(07/14/2015)   Status On-going   CC Long Term Goal  #3   Title Patient will improve left shoulder abduction to 120 degrees so that she can wash her hair with greater ease  (07/14/2015)   Status On-going   CC Long Term Goal  #4   Title Patient will improve right shoulder abduction to 120 degrees so that she can wash her hair with greater ease (07/14/2015)   Status On-going   CC Long Term Goal  #5   Title Patient will be independent in a basic advanced home exercise program (07/14/2015)  Advanced this this week.   Status On-going            Plan - 07/01/15 1110    Clinical Impression Statement Pt did well with exercises today and tolerated light resistive exercises well. Her functional ROM continues to improve per her report and she is progressing well towards her goals.    Pt will benefit from skilled therapeutic intervention in order to improve on the following deficits Decreased range of motion;Postural dysfunction   Rehab Potential Excellent   Clinical Impairments Affecting Rehab Potential continues with target therapy through IV every 3 weeks  Activity restriction with no massage.    PT Frequency 2x / week   PT Duration 4 weeks   PT Treatment/Interventions Therapeutic exercise;Patient/family education   PT Next Visit Plan Continue with focus on postural exercises and shoulder ROM.  Remeasure for goal update    Consulted and Agree with Plan of Care Patient        Problem List Patient Active Problem List   Diagnosis Date Noted  . Allergic dermatitis 06/19/2015  . Nausea without vomiting 12/30/2014  . Diarrhea 12/30/2014  . Dehydration 12/30/2014  . Hypotension 12/30/2014  . 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 External hemorrhoid 12/13/2014  . UTI (urinary tract infection) 11/16/2014   . Genetic testing 11/07/2014  . Migraine headache 11/06/2014  . Family history of breast cancer   . Family history of colon cancer   . Breast cancer of upper-outer quadrant of left female breast ([facility]) 10/11/2014    Otelia Limes, PTA 07/01/2015, 12:23 PM  [place], [place], [address] Phone: [phone]   Fax:  [phone]  Name: DORIA FERN MRN: [id number] Date of Birth: [birth year]

## 2015-07-02 ENCOUNTER — Encounter: Payer: Self-pay | Admitting: Radiation Oncology

## 2015-07-02 ENCOUNTER — Ambulatory Visit
Admission: RE | Admit: 2015-07-02 | Discharge: 2015-07-02 | Disposition: A | Discharge status: Home / Self Care | Payer: BLUE CROSS/BLUE SHIELD | Source: Ambulatory Visit | Attending: Radiation Oncology | Admitting: Radiation Oncology

## 2015-07-02 ENCOUNTER — Ambulatory Visit: Payer: BLUE CROSS/BLUE SHIELD | Admitting: Physical Therapy

## 2015-07-02 VITALS — BP 102/76 | HR 80 | Temp 97.9°F | Resp 16 | Ht 60.5 in | Wt 110.2 lb

## 2015-07-02 DIAGNOSIS — Z510 Encounter for antineoplastic radiation therapy: Secondary | ICD-10-CM | POA: Insufficient documentation

## 2015-07-02 DIAGNOSIS — Z171 Estrogen receptor negative status [ER-]: Secondary | ICD-10-CM | POA: Diagnosis not present

## 2015-07-02 DIAGNOSIS — M25612 Stiffness of left shoulder, not elsewhere classified: Secondary | ICD-10-CM

## 2015-07-02 DIAGNOSIS — Z9189 Other specified personal risk factors, not elsewhere classified: Secondary | ICD-10-CM

## 2015-07-02 DIAGNOSIS — C50412 Malignant neoplasm of upper-outer quadrant of left female breast: Secondary | ICD-10-CM | POA: Insufficient documentation

## 2015-07-02 DIAGNOSIS — M25611 Stiffness of right shoulder, not elsewhere classified: Secondary | ICD-10-CM

## 2015-07-02 NOTE — Progress Notes (Signed)
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  She was initially seen in the multidisciplinary breast clinic on 10/16/2014. Patient was found to have a high-grade HER-2/neu positive tumor. She proceeded with neoadjuvant chemotherapy as documented above with an excellent clinical and radiographic response.  [name] [name] presents to the clinic today for a re-evaluation. The patient decided to have bilateral mastectomies and immediate reconstruction. She had on 05/19/2015.BILATERAL NIPPLE SPARING MASTECTOMY WITH LEFT AXILLARY SENTINAL LYMPH NODE BIOPSY,  (Bilateral) IMMEDIATE BILATERAL BREAST RECONSTRUCTION WITH SILICONE GEL IMPLANTS AND ADM (ACELLULAR DERMA MATRIX) (Bilateral). Pathology revealed no residual cancer within the breast or axillary region . She has an appointment with Dr. Harlow Mares 07/04/2015. She mentions that she had a rash "like poison ivy" around her incisions which she was given steroids to help with this. The rash has improved, but is still present. She mentions that she has some tightness and burning in her chest. She mentions that medication does not help with these symptoms. She denies swelling in her left arm.  ALLERGIES:  is allergic to meperidine; stadol; zofran; and sulfa antibiotics.  Meds: Current Outpatient Prescriptions  Medication Sig Dispense Refill  . acetaminophen (TYLENOL) 325 MG tablet Take 650 mg by mouth every 6 (six) hours as needed for mild pain.     Marland Kitchen atenolol  (TENORMIN) 25 MG tablet Take 75 mg by mouth daily. For migraines not blood pressure    . betamethasone valerate (VALISONE) 0.1 % cream Apply topically.    . Calcium Carbonate (CALCIUM 600 PO) Take 1 tablet by mouth daily.     . cholecalciferol (VITAMIN D) 1000 UNITS tablet Take 1,000 Units by mouth daily.    Marland Kitchen docusate sodium (COLACE) 100 MG capsule Take 100 mg by mouth 2 (two) times daily as needed for mild constipation.    Marland Kitchen eletriptan (RELPAX) 40 MG tablet Take 1 tablet (40 mg total) by mouth every 2 (two) hours as needed for migraine or headache (Max 2 per day). 10 tablet 3  . hydrocortisone 2.5 % ointment Apply topically 2 (two) times daily. (Patient taking differently: Apply 1 application topically 2 (two) times daily as needed. ) 30 g 0  . Lactobacillus (ACIDOPHILUS PO) Take 1 capsule by mouth daily.     Marland Kitchen lidocaine-prilocaine (EMLA) cream Apply to affected area once (Patient taking differently: Apply 1 application topically as needed. Apply to affected area once) 30 g 3  . PARoxetine (PAXIL) 20 MG tablet Take 20 mg by mouth.    . promethazine (PHENERGAN) 12.5 MG tablet Take 1 tablet (12.5 mg total) by mouth every 6 (six) hours as needed for nausea or vomiting. 90 tablet 3  . enoxaparin (LOVENOX) 40 MG/0.4ML injection Inject 0.4 mLs (40 mg total) into the skin daily. (Patient not taking: Reported on 07/02/2015) 12 Syringe 0  . HYDROmorphone (DILAUDID) 2 MG tablet Take 1-2 tablets (2-4 mg total) by mouth every 4 (four) hours as needed for moderate pain. (Patient not taking: Reported on 07/02/2015)  40 tablet 0   No current facility-administered medications for this encounter.    Physical Findings: The patient is in no acute distress. Patient is alert and oriented.  Accompanied by husband on evaluation today  height is 5' 0.5" (1.537 m) and weight is 110 lb 3.2 oz (49.986 kg). Her oral temperature is 97.9 F (36.6 C). Her blood pressure is 102/76 and her pulse is 80. Her respiration is 16. Marland Kitchen  No  palpable supraclavicular or axillary adenopathy. The lungs are clear to auscultation.  The heart has a regular rhythm and rate. Her incisions are healing well with no signs of infection on either side. Lab Findings: Lab Results  Component Value Date   WBC 6.3 05/29/2015   HGB 11.0* 05/29/2015   HCT 33.3* 05/29/2015   MCV 85.7 05/29/2015   PLT 370 05/29/2015    Radiographic Findings: No results found.  Impression:  Clinical stage IIA high-grade invasive ductal carcinoma of the left breast. As above the patient underwent neo-adjuvant treatment with excellent clinical and radiographic response as well as a complete pathologic response I discussed with the patient and her husband that prior to her neo-adjuvant treatment she had several radiographically involved axillary lymph nodes as well as subpectoral lymph nodes. I also discussed with the patient that the subpectoral lymph node areas would not be dissected at the time of her axillary dissection. Without postmastectomy irradiation we would have to rely on chemotherapy alone to sterilize the involved subpectoral nodes. Given these issues I would recommend postmastectomy irradiation.  I have discussed this with my radiation oncology colleagues who also agree with recommendations for postmastectomy irradiation in this situation. If the patient had not proceeded with neoadjuvant treatment she likely would've had 4 or more positive lymph nodes thus warranting postmastectomy irradiation therapy.  Plan:  She will continue the Herceptin and will finish in May. Patient will meet with Dr. Harlow Mares next week. I would recommend proceeding with postmastectomy irradiation along the left side once she has had adequate healing from her nipple sparing mastectomy and reconstruction. I will contact Dr. Harlow Mares next week for discussion of this issue.  ____________________________________ -----------------------------------  Blair Promise, PhD, [name] [name]     This  document serves as a record of services personally performed by Gery Pray, [name] [name]. It was created on his behalf by Lendon Collar, a trained medical scribe. The creation of this record is based on the scribe's personal observations and the provider's statements to them. This document has been checked and approved by the attending provider.

## 2015-07-02 NOTE — Progress Notes (Signed)
Please see the Nurse Progress Note in the MD Initial Consult Encounter for this patient. 

## 2015-07-02 NOTE — Patient Instructions (Signed)
2.  Strengthening: Resisted Internal Rotation   Hold tubing in left hand, elbow at side and forearm out. Rotate forearm in across body. Repeat 5-10____ times per set.  http://orth.exer.us/830   Copyright  VHI. All rights reserved.   4  Strengthening: Resisted External Rotation   Hold tubing in right hand, elbow at side and forearm across body. Rotate forearm out. Repeat _5___ times per set.   http://orth.exer.us/828    1. Copyright  VHI. All rights reserved.  Strengthening: Resisted Flexion   Hold tubing with left arm at side. Pull forward and up. Move shoulder through pain-free range of motion. Repeat _5-10___ times per set.   http://orth.exer.us/824   Copyright  VHI. All rights reserved.   3.  Strengthening: Resisted Extension   Hold tubing in right hand, arm forward. Pull arm back, elbow straight. Repeat _5-10___ times per set.   http://orth.exer.us/832   Copyright  VHI. All rights reserved.

## 2015-07-02 NOTE — Therapy (Signed)
[name] [name], [place], [address] Phone: [phone]   Fax:  [phone]  Physical Therapy Treatment  Patient Details  Name: [name] [name] MRN: [id number] Date of Birth: 1961-05-24 Referring Provider: Harlow Mares   Encounter Date: 07/02/2015      PT End of Session - 07/02/15 1152    Visit Number 7   Number of Visits 9   Date for PT Re-Evaluation 07/14/15   PT Start Time 1105   PT Stop Time 1145   PT Time Calculation (min) 40 min   Activity Tolerance Patient tolerated treatment well   Behavior During Therapy [facility] for tasks assessed/performed      Past Medical History  Diagnosis Date  . Osteopenia   . Neurogenic bladder   . Wears contact lenses   . Family history of breast cancer   . Family history of colon cancer   . Breast cancer of upper-outer quadrant of right female breast ([facility]) 10/11/2014  . Breast cancer ([facility]) 2016    ER-/PR-/Her2+  . Migraines     takes metoprolol  for migraine headaches    Past Surgical History  Procedure Laterality Date  . Vein ligation      both legs  . Colonoscopy    . Upper gi endoscopy    . Portacath placement Right 10/29/2014    Procedure: INSERTION PORT-A-CATH WITH ULTRASOUND;  Surgeon: Erroll Luna, MD;  Location: [facility];  Service: General;  Laterality: Right;  . Breast surgery      rt, breast biopsy  . Nipple sparing mastectomy/sentinal lymph node biopsy/reconstruction/placement of tissue expander Bilateral 05/19/2015    Procedure: BILATERAL NIPPLE SPARING MASTECTOMY WITH LEFT AXILLARY SENTINAL LYMPH NODE BIOPSY;  Surgeon: Erroll Luna, MD;  Location: [facility];  Service: General;  Laterality: Bilateral;  . Breast reconstruction with placement of tissue expander and flex hd (acellular hydrated dermis) Bilateral 05/19/2015    Procedure: BILATERAL NIPPLE SPARING MASTECTOMY WITH LEFT AXILLARY SENTINAL LYMPH NODE BIOPSY;  Surgeon: Erroll Luna, MD;   Location: [facility];  Service: General;  Laterality: Bilateral;    There were no vitals filed for this visit.  Visit Diagnosis:  Shoulder stiffness, left  Shoulder stiffness, right  At risk for lymphedema      Subjective Assessment - 07/02/15 1112    Subjective She said the rash is not as itchy, but it is spreading down to to abdomen, she is seeing Dr. Harlow Mares tomorrow.  She went see Dr. Sondra Come this morning who said that she does not have enough range of motion for radition yet.  Pt feels she is not ready to start it.  She is disappointed that she had to have radiation    Patient is accompained by: --  friend Debbie   Pertinent History Diagnosed 10/03/14 with left ER/PR negative, HER2 positive breast cancer.  She also has a positive left axillary node. Bilateral mastectomy with 2 nodes removed from the left side, none on the right.on Nov. 21 with immedicate silicone implants. Has had chemotherapy and will continue with target therapy till June and will see about radition therapy in  January.     Currently in Pain? Yes   Pain Score 1    Pain Location Chest                         [facility] Adult PT Treatment/Exercise - 07/02/15 0001    Shoulder Exercises: Standing   External Rotation Strengthening;Both;5 reps;Theraband  Theraband Level (Shoulder External Rotation) Level 2 (Red)   Internal Rotation Strengthening;Both;5 reps   Theraband Level (Shoulder Internal Rotation) Level 2 (Red)   Flexion Right;Both;5 reps   Theraband Level (Shoulder Flexion) Level 2 (Red)   Extension Strengthening;Both;5 reps   Theraband Level (Shoulder Extension) Level 2 (Red)   Shoulder Exercises: ROM/Strengthening   Wall Wash in felxion and circles, 5 reps    Ball on Wall yellow ball for shoulder flexion stretch x 5 reps    Other ROM/Strengthening Exercises modified downward dog on wall for trunk and back stretch    Other ROM/Strengthening Exercises finger ladder in flexion and abductoin x 5 reps                  PT Education - 07/02/15 1152    Education provided Yes   Education Details [place]wood series of exercise with red theraband with instruction to progress reps as able    Person(s) Educated Patient   Methods Explanation;Demonstration;Handout   Comprehension Verbalized understanding;Returned demonstration           Short Term Clinic Goals - 06/13/15 1307    CC Short Term Goal  #1   Title Short term goals = long term goals             Long Term Clinic Goals - 06/26/15 0857    CC Long Term Goal  #1   Title Patient will improve left shoulder flexion range of motion to 125 degrees so reach up in her kitchen cabinets (07/14/2015)   Status On-going   CC Long Term Goal  #2   Title Patient will improve right shoulder flexion range of motion to 125 degrees to perform activities of daily living and household chores with greater ease(07/14/2015)   Status On-going   CC Long Term Goal  #3   Title Patient will improve left shoulder abduction to 120 degrees so that she can wash her hair with greater ease  (07/14/2015)   Status On-going   CC Long Term Goal  #4   Title Patient will improve right shoulder abduction to 120 degrees so that she can wash her hair with greater ease (07/14/2015)   Status On-going   CC Long Term Goal  #5   Title Patient will be independent in a basic advanced home exercise program (07/14/2015)  Advanced this this week.   Status On-going            Plan - 07/02/15 1154    Clinical Impression Statement Upgraded exercise to Rockwood exercise with red theraband.  Pt still feels that she had tightness in her shoulders and was told she needs more range of motion before she can have radiation therapy. Pt motivated to continue with exercise and is anxious to see Dr. Harlow Mares tomorrow   Clinical Impairments Affecting Rehab Potential continues with target therapy through IV every 3 weeks  Activity restriction with no massage.    PT Next Visit Plan    Remeasure for goal update  Assess effect of Rockwood and incrase # of reps. Progress to wall scaplar exercise with theraband when pt is ready.    Consulted and Agree with Plan of Care Patient        Problem List Patient Active Problem List   Diagnosis Date Noted  . Allergic dermatitis 06/19/2015  . Nausea without vomiting 12/30/2014  . Diarrhea 12/30/2014  . Dehydration 12/30/2014  . Hypotension 12/30/2014  . 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 External hemorrhoid 12/13/2014  .  UTI (urinary tract infection) 11/16/2014  . Genetic testing 11/07/2014  . Migraine headache 11/06/2014  . Family history of breast cancer   . Family history of colon cancer   . Breast cancer of upper-outer quadrant of left female breast ([facility]) 10/11/2014   Donato Heinz. Owens Shark, PT  07/02/2015, 11:58 AM  [place], [place], [address] Phone: [phone]   Fax:  [phone]  Name: [name] [name] MRN: [id number] Date of Birth: [birth year]

## 2015-07-03 ENCOUNTER — Encounter: Payer: BLUE CROSS/BLUE SHIELD | Admitting: Physical Therapy

## 2015-07-07 ENCOUNTER — Ambulatory Visit: Payer: BLUE CROSS/BLUE SHIELD

## 2015-07-08 ENCOUNTER — Ambulatory Visit: Payer: BLUE CROSS/BLUE SHIELD

## 2015-07-08 DIAGNOSIS — M25612 Stiffness of left shoulder, not elsewhere classified: Secondary | ICD-10-CM | POA: Diagnosis not present

## 2015-07-08 DIAGNOSIS — M25611 Stiffness of right shoulder, not elsewhere classified: Secondary | ICD-10-CM

## 2015-07-08 DIAGNOSIS — Z9189 Other specified personal risk factors, not elsewhere classified: Secondary | ICD-10-CM

## 2015-07-08 NOTE — Therapy (Signed)
[place], [place], [address] Phone: [phone]   Fax:  [phone]  Physical Therapy Treatment  Patient Details  Name: [name] [name] MRN: [id number] Date of Birth: 03-27-1961 Referring Provider: Harlow Mares   Encounter Date: 07/08/2015      PT End of Session - 07/08/15 0849    Visit Number 8   Number of Visits 9   Date for PT Re-Evaluation 07/14/15   PT Start Time 0812   PT Stop Time 0850   PT Time Calculation (min) 38 min   Activity Tolerance Patient tolerated treatment well   Behavior During Therapy [facility] for tasks assessed/performed      Past Medical History  Diagnosis Date  . Osteopenia   . Neurogenic bladder   . Wears contact lenses   . Family history of breast cancer   . Family history of colon cancer   . Breast cancer of upper-outer quadrant of right female breast ([place]) 10/11/2014  . Breast cancer ([place]) 2016    ER-/PR-/Her2+  . Migraines     takes metoprolol  for migraine headaches    Past Surgical History  Procedure Laterality Date  . Vein ligation      both legs  . Colonoscopy    . Upper gi endoscopy    . Portacath placement Right 10/29/2014    Procedure: INSERTION PORT-A-CATH WITH ULTRASOUND;  Surgeon: Erroll Luna, MD;  Location: [place];  Service: General;  Laterality: Right;  . Breast surgery      rt, breast biopsy  . Nipple sparing mastectomy/sentinal lymph node biopsy/reconstruction/placement of tissue expander Bilateral 05/19/2015    Procedure: BILATERAL NIPPLE SPARING MASTECTOMY WITH LEFT AXILLARY SENTINAL LYMPH NODE BIOPSY;  Surgeon: Erroll Luna, MD;  Location: [place];  Service: General;  Laterality: Bilateral;  . Breast reconstruction with placement of tissue expander and flex hd (acellular hydrated dermis) Bilateral 05/19/2015    Procedure: BILATERAL NIPPLE SPARING MASTECTOMY WITH LEFT AXILLARY SENTINAL LYMPH NODE BIOPSY;  Surgeon: Erroll Luna, MD;   Location: [place];  Service: General;  Laterality: Bilateral;    There were no vitals filed for this visit.  Visit Diagnosis:  Shoulder stiffness, left  Shoulder stiffness, right  At risk for lymphedema      Subjective Assessment - 07/08/15 0852    Subjective Doing well, I think I might be ready to be done with therapy soon. Dr. Harlow Mares said I need to start moving my scar around when I saw him last week.    Currently in Pain? No/denies                         [facility] Adult PT Treatment/Exercise - 07/08/15 0001    Shoulder Exercises: Pulleys   Flexion 2 minutes   ABduction 2 minutes   Shoulder Exercises: Therapy Ball   Flexion 10 reps  With forward lean at end of stretch   Shoulder Exercises: ROM/Strengthening   Other ROM/Strengthening Exercises Reviewed technique of bil abduction leaning against wall and correct techniques of 4 way [place]   Manual Therapy   Manual therapy comments Passive BUE neural stretching to tolerance   Myofascial Release Instructed pt in scar tissue mobilization as she reports Dr. Harlow Mares wants her to begin doing this.   Passive ROM In Supine to bil shoulders into flexion, abduction, and D2 to pts tolerance.                 PT Education -  07/08/15 0850    Education provided Yes   Education Details Scar tissue massage to bil incisions   Person(s) Educated Patient   Methods Explanation;Demonstration;Tactile cues   Comprehension Verbalized understanding;Returned demonstration;Need further instruction           Short Term Clinic Goals - 06/13/15 1307    CC Short Term Goal  #1   Title Short term goals = long term goals             Long Term Clinic Goals - 06/26/15 0857    CC Long Term Goal  #1   Title Patient will improve left shoulder flexion range of motion to 125 degrees so reach up in her kitchen cabinets (07/14/2015)   Status On-going   CC Long Term Goal  #2   Title Patient will improve right shoulder flexion  range of motion to 125 degrees to perform activities of daily living and household chores with greater ease(07/14/2015)   Status On-going   CC Long Term Goal  #3   Title Patient will improve left shoulder abduction to 120 degrees so that she can wash her hair with greater ease  (07/14/2015)   Status On-going   CC Long Term Goal  #4   Title Patient will improve right shoulder abduction to 120 degrees so that she can wash her hair with greater ease (07/14/2015)   Status On-going   CC Long Term Goal  #5   Title Patient will be independent in a basic advanced home exercise program (07/14/2015)  Advanced this this week.   Status On-going            Plan - 07/08/15 0854    Clinical Impression Statement Pt doing well with her HEP, issued green theraband so pt can progress when ready. Pt did well with instruction of scar tissue mobilization today to bil incisions. She is progressing very well overall with therapy and towards goals.    Pt will benefit from skilled therapeutic intervention in order to improve on the following deficits Decreased range of motion;Postural dysfunction   Rehab Potential Excellent   Clinical Impairments Affecting Rehab Potential continues with target therapy through IV every 3 weeks  Activity restriction with no massage.    PT Frequency 2x / week   PT Duration 4 weeks   PT Treatment/Interventions Therapeutic exercise;Patient/family education   PT Next Visit Plan Assess for renewal or discharge next vist, though pt seems to be leaning towards discharge. Progress to wall scaplar exercise with theraband when pt is ready.    PT Home Exercise Plan see education section   Consulted and Agree with Plan of Care Patient        Problem List Patient Active Problem List   Diagnosis Date Noted  . Allergic dermatitis 06/19/2015  . Nausea without vomiting 12/30/2014  . Diarrhea 12/30/2014  . Dehydration 12/30/2014  . Hypotension 12/30/2014  . Antineoplastic chemotherapy  induced anemia 12/13/2014  . External hemorrhoid 12/13/2014  . UTI (urinary tract infection) 11/16/2014  . Genetic testing 11/07/2014  . Migraine headache 11/06/2014  . Family history of breast cancer   . Family history of colon cancer   . Breast cancer of upper-outer quadrant of left female breast ([place]) 10/11/2014    Otelia Limes, PTA 07/08/2015, 8:58 AM  [place], [place], [address] Phone: [phone]   Fax:  [phone]  Name: [name] [name] MRN: [id number] Date of Birth: [birth year]

## 2015-07-09 NOTE — Assessment & Plan Note (Signed)
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:00 position, left axillary lymph node palpable, ER/PR negative, 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 5.32, T1 cN1 M0 stage II a clinical stage Treatment summary: Neo adjuvant chemotherapy with TCH for general every 3 weeks 6 cycles started 11/01/2014 completed 02/14/2015 Post-neo-adjuvant breast MRI 02/17/2015: No residual mass or abnormal enhancement, no enlarged lymph nodes Post-neo-adjuvant CT chest 02/18/2015: Interval resolution of the left breast mass and multiple enlarged left axillary and subpectoral lymph nodes Bilateral mastectomies 05/19/2015 Left: Radial scar, FA, UDH no malignancy 0/1 lymph node negative; right: FA, radial scar, UDH no malignancy (pathologic complete response) [id number] ---------------------------------------------------------------------------------------------------------------------------------------------------- Current treatment: Herceptin maintenance Patient will start adjuvant radiation therapy  Chemotherapy-induced anemia: monitoring Rash underneath the breasts bilaterally extending into the axilla: his most certainly contact dermatitis. I will prescribe her Medrol Dosepak. I instructed her to call us next week. If it does not get better then we might have to treat her with acyclovir.  Return to clinic in 6 weeks for follow-up and in 3 weeks for Herceptin. 

## 2015-07-10 ENCOUNTER — Ambulatory Visit (HOSPITAL_BASED_OUTPATIENT_CLINIC_OR_DEPARTMENT_OTHER): Payer: BLUE CROSS/BLUE SHIELD | Admitting: Hematology and Oncology

## 2015-07-10 ENCOUNTER — Telehealth: Payer: Self-pay | Admitting: Hematology and Oncology

## 2015-07-10 ENCOUNTER — Encounter: Payer: Self-pay | Admitting: *Deleted

## 2015-07-10 ENCOUNTER — Other Ambulatory Visit (HOSPITAL_BASED_OUTPATIENT_CLINIC_OR_DEPARTMENT_OTHER): Payer: BLUE CROSS/BLUE SHIELD

## 2015-07-10 ENCOUNTER — Ambulatory Visit (HOSPITAL_BASED_OUTPATIENT_CLINIC_OR_DEPARTMENT_OTHER): Payer: BLUE CROSS/BLUE SHIELD

## 2015-07-10 ENCOUNTER — Encounter: Payer: Self-pay | Admitting: Hematology and Oncology

## 2015-07-10 VITALS — BP 113/75 | HR 68 | Temp 98.1°F | Resp 18 | Wt 107.1 lb

## 2015-07-10 DIAGNOSIS — C773 Secondary and unspecified malignant neoplasm of axilla and upper limb lymph nodes: Secondary | ICD-10-CM

## 2015-07-10 DIAGNOSIS — D6481 Anemia due to antineoplastic chemotherapy: Secondary | ICD-10-CM | POA: Diagnosis not present

## 2015-07-10 DIAGNOSIS — Z5112 Encounter for antineoplastic immunotherapy: Secondary | ICD-10-CM

## 2015-07-10 DIAGNOSIS — R21 Rash and other nonspecific skin eruption: Secondary | ICD-10-CM

## 2015-07-10 DIAGNOSIS — C50412 Malignant neoplasm of upper-outer quadrant of left female breast: Secondary | ICD-10-CM

## 2015-07-10 DIAGNOSIS — T451X5A Adverse effect of antineoplastic and immunosuppressive drugs, initial encounter: Principal | ICD-10-CM

## 2015-07-10 DIAGNOSIS — Z171 Estrogen receptor negative status [ER-]: Secondary | ICD-10-CM

## 2015-07-10 LAB — CBC WITH DIFFERENTIAL/PLATELET
BASO%: 0.8 % (ref 0.0–2.0)
Basophils Absolute: 0 10*3/uL (ref 0.0–0.1)
EOS%: 2.1 % (ref 0.0–7.0)
Eosinophils Absolute: 0.1 10*3/uL (ref 0.0–0.5)
HEMATOCRIT: 35 % (ref 34.8–46.6)
HEMOGLOBIN: 11.6 g/dL (ref 11.6–15.9)
LYMPH#: 1.5 10*3/uL (ref 0.9–3.3)
LYMPH%: 31 % (ref 14.0–49.7)
MCH: 27.7 pg (ref 25.1–34.0)
MCHC: 33.3 g/dL (ref 31.5–36.0)
MCV: 83.2 fL (ref 79.5–101.0)
MONO#: 0.4 10*3/uL (ref 0.1–0.9)
MONO%: 7.8 % (ref 0.0–14.0)
NEUT%: 58.3 % (ref 38.4–76.8)
NEUTROS ABS: 2.9 10*3/uL (ref 1.5–6.5)
Platelets: 228 10*3/uL (ref 145–400)
RBC: 4.21 10*6/uL (ref 3.70–5.45)
RDW: 14.9 % — AB (ref 11.2–14.5)
WBC: 4.9 10*3/uL (ref 3.9–10.3)

## 2015-07-10 LAB — COMPREHENSIVE METABOLIC PANEL
ALBUMIN: 4.2 g/dL (ref 3.5–5.0)
ALK PHOS: 80 U/L (ref 40–150)
ALT: 22 U/L (ref 0–55)
AST: 24 U/L (ref 5–34)
Anion Gap: 9 mEq/L (ref 3–11)
BUN: 14.9 mg/dL (ref 7.0–26.0)
CALCIUM: 9.8 mg/dL (ref 8.4–10.4)
CHLORIDE: 103 meq/L (ref 98–109)
CO2: 28 mEq/L (ref 22–29)
CREATININE: 0.8 mg/dL (ref 0.6–1.1)
EGFR: 87 mL/min/{1.73_m2} — ABNORMAL LOW (ref >90)
Glucose: 79 mg/dl (ref 70–140)
Potassium: 4.6 mEq/L (ref 3.5–5.1)
Sodium: 140 mEq/L (ref 136–145)
TOTAL PROTEIN: 7.3 g/dL (ref 6.4–8.3)
Total Bilirubin: 0.44 mg/dL (ref 0.20–1.20)

## 2015-07-10 MED ORDER — SODIUM CHLORIDE 0.9 % IV SOLN
Freq: Once | INTRAVENOUS | Status: AC
  Administered 2015-07-10: 11:00:00 via INTRAVENOUS

## 2015-07-10 MED ORDER — SODIUM CHLORIDE 0.9 % IV SOLN
6.0000 mg/kg | Freq: Once | INTRAVENOUS | Status: AC
  Administered 2015-07-10: 294 mg via INTRAVENOUS
  Filled 2015-07-10: qty 14

## 2015-07-10 MED ORDER — SODIUM CHLORIDE 0.9 % IJ SOLN
10.0000 mL | INTRAMUSCULAR | Status: DC | PRN
  Administered 2015-07-10: 10 mL
  Filled 2015-07-10: qty 10

## 2015-07-10 MED ORDER — ACETAMINOPHEN 325 MG PO TABS
ORAL_TABLET | ORAL | Status: AC
  Filled 2015-07-10: qty 2

## 2015-07-10 MED ORDER — HEPARIN SOD (PORK) LOCK FLUSH 100 UNIT/ML IV SOLN
500.0000 [IU] | Freq: Once | INTRAVENOUS | Status: AC | PRN
  Administered 2015-07-10: 500 [IU]
  Filled 2015-07-10: qty 5

## 2015-07-10 MED ORDER — DIPHENHYDRAMINE HCL 25 MG PO CAPS: 50.0000 mg | ORAL_CAPSULE | Freq: Once | ORAL | Status: DC

## 2015-07-10 MED ORDER — DIPHENHYDRAMINE HCL 25 MG PO CAPS
ORAL_CAPSULE | ORAL | Status: AC
  Filled 2015-07-10: qty 2

## 2015-07-10 MED ORDER — ACETAMINOPHEN 325 MG PO TABS: 650.0000 mg | ORAL_TABLET | Freq: Once | ORAL | Status: DC

## 2015-07-10 NOTE — Patient Instructions (Signed)
[facility] Discharge Instructions for Patients Receiving Chemotherapy  Today you received the following chemotherapy agents HERCEPTIN  To help prevent nausea and vomiting after your treatment, we encourage you to take your nausea medication as directed   If you develop nausea and vomiting that is not controlled by your nausea medication, call the clinic.   BELOW ARE SYMPTOMS THAT SHOULD BE REPORTED IMMEDIATELY:  *FEVER GREATER THAN 100.5 F  *CHILLS WITH OR WITHOUT FEVER  NAUSEA AND VOMITING THAT IS NOT CONTROLLED WITH YOUR NAUSEA MEDICATION  *UNUSUAL SHORTNESS OF BREATH  *UNUSUAL BRUISING OR BLEEDING  TENDERNESS IN MOUTH AND THROAT WITH OR WITHOUT PRESENCE OF ULCERS  *URINARY PROBLEMS  *BOWEL PROBLEMS  UNUSUAL RASH Items with * indicate a potential emergency and should be followed up as soon as possible.  Feel free to call the clinic you have any questions or concerns. The clinic phone number is [phone].

## 2015-07-10 NOTE — Progress Notes (Signed)
Patient Care Team: Laray Anger, MD as PCP - General (Obstetrics and Gynecology) Erroll Luna, MD as Consulting Physician (General Surgery) Nicholas Lose, MD as Consulting Physician (Hematology and Oncology) Gery Pray, MD as Consulting Physician (Radiation Oncology) Mauro Kaufmann, RN as Registered Nurse Rockwell Germany, RN as Registered Nurse  DIAGNOSIS: Breast cancer of upper-outer quadrant of left female breast [facility])   Staging form: Breast, AJCC 7th Edition     Clinical stage from 10/16/2014: Stage IIA (T1c, N1, M0) - Unsigned     Pathologic stage from 05/21/2015: yT0, N0, cM0 - Unsigned       Staging comments: Staged on surgical specimen by Dr. Lyndon Code  SUMMARY OF ONCOLOGIC HISTORY:   Breast cancer of upper-outer quadrant of left female breast ([place])   10/01/2014 Mammogram Left breast mammogram and ultrasound for palpable left breast mass which was a maxillary mass with an ill-defined abnormality by ultrasound 1.7 cm at 2:00   10/11/2014 Initial Diagnosis Left breast invasive ductal carcinoma, grade 3, ER/PR negative, 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 5.32, axillary lymph node also positive for cancer   10/30/2014 Imaging CT chest abdomen pelvis and bone scan revealed no evidence of distant metastatic disease but enlarged left axillary and left subpectoral lymph nodes   11/01/2014 -  Neo-Adjuvant Chemotherapy Neoadjuvant TCH [place] to 3 weeks 6 cycles followed by Herceptin maintenance   02/17/2015 Breast MRI No residual mass or abnormal enhancement, no enlarged lymph nodes   05/19/2015 Surgery Bilateral mastectomies with immediate reconstruction Left: Radial scar, FA, UDH no malignancy 0/1 lymph node negative; right: FA, radial scar, UV H no malignancy [id number]    CHIEF COMPLIANT: follow-up on Herceptin  INTERVAL HISTORY: [name] [name] is a 55 year old with above-mentioned history left breast cancer currently on maintenance Herceptin. She appears to be tolerating it fairly well. Does not have any  nausea or vomiting. Her major complaint is skin rash on the abdomen is petechial rash across the belly. Previously we have prescribed her Medrol Dosepak and it helped resolve the rash but it has now come back although not as severe as before. She was prescribed Diflucan and she has started taking it yesterday.  REVIEW OF SYSTEMS:   Constitutional: Denies fevers, chills or abnormal weight loss Eyes: Denies blurriness of vision Ears, nose, mouth, throat, and face: Denies mucositis or sore throat Respiratory: Denies cough, dyspnea or wheezes Cardiovascular: Denies palpitation, chest discomfort Gastrointestinal:  Denies nausea, heartburn or change in bowel habits Skin: rash on the belly Lymphatics: Denies new lymphadenopathy or easy bruising Neurological:Denies numbness, tingling or new weaknesses Behavioral/Psych: Mood is stable, no new changes  Extremities: No lower extremity edema  All other systems were reviewed with the patient and are negative.  I have reviewed the past medical history, past surgical history, social history and family history with the patient and they are unchanged from previous note.  ALLERGIES:  is allergic to meperidine; stadol; zofran; and sulfa antibiotics.  MEDICATIONS:  Current Outpatient Prescriptions  Medication Sig Dispense Refill  . acetaminophen (TYLENOL) 325 MG tablet Take 650 mg by mouth every 6 (six) hours as needed for mild pain.     Marland Kitchen atenolol (TENORMIN) 25 MG tablet Take 75 mg by mouth daily. For migraines not blood pressure    . betamethasone valerate (VALISONE) 0.1 % cream Apply topically.    . Calcium Carbonate (CALCIUM 600 PO) Take 1 tablet by mouth daily.     . cholecalciferol (VITAMIN D) 1000 UNITS tablet Take 1,000 Units by mouth daily.    Marland Kitchen  docusate sodium (COLACE) 100 MG capsule Take 100 mg by mouth 2 (two) times daily as needed for mild constipation.    Marland Kitchen eletriptan (RELPAX) 40 MG tablet Take 1 tablet (40 mg total) by mouth every 2 (two)  hours as needed for migraine or headache (Max 2 per day). 10 tablet 3  . enoxaparin (LOVENOX) 40 MG/0.4ML injection Inject 0.4 mLs (40 mg total) into the skin daily. (Patient not taking: Reported on 07/02/2015) 12 Syringe 0  . hydrocortisone 2.5 % ointment Apply topically 2 (two) times daily. (Patient taking differently: Apply 1 application topically 2 (two) times daily as needed. ) 30 g 0  . HYDROmorphone (DILAUDID) 2 MG tablet Take 1-2 tablets (2-4 mg total) by mouth every 4 (four) hours as needed for moderate pain. (Patient not taking: Reported on 07/02/2015) 40 tablet 0  . Lactobacillus (ACIDOPHILUS PO) Take 1 capsule by mouth daily.     Marland Kitchen lidocaine-prilocaine (EMLA) cream Apply to affected area once (Patient taking differently: Apply 1 application topically as needed. Apply to affected area once) 30 g 3  . PARoxetine (PAXIL) 20 MG tablet Take 20 mg by mouth.    . promethazine (PHENERGAN) 12.5 MG tablet Take 1 tablet (12.5 mg total) by mouth every 6 (six) hours as needed for nausea or vomiting. 90 tablet 3   No current facility-administered medications for this visit.    PHYSICAL EXAMINATION: ECOG PERFORMANCE STATUS: 1 - Symptomatic but completely ambulatory  Filed Vitals:   07/10/15 0911  BP: 113/75  Pulse: 68  Temp: 98.1 F (36.7 C)  Resp: 18   Filed Weights   07/10/15 0911  Weight: 107 lb 1.6 oz (48.58 kg)    GENERAL:alert, no distress and comfortable SKIN: skin color, texture, turgor are normal, no rashes or significant lesions EYES: normal, Conjunctiva are pink and non-injected, sclera clear OROPHARYNX:no exudate, no erythema and lips, buccal mucosa, and tongue normal  NECK: supple, thyroid normal size, non-tender, without nodularity LYMPH:  no palpable lymphadenopathy in the cervical, axillary or inguinal LUNGS: clear to auscultation and percussion with normal breathing effort HEART: regular rate & rhythm and no murmurs and no lower extremity edema ABDOMEN:abdomen soft,  non-tender and normal bowel sounds MUSCULOSKELETAL:no cyanosis of digits and no clubbing  NEURO: alert & oriented x 3 with fluent speech, no focal motor/sensory deficits EXTREMITIES: No lower extremity edema  LABORATORY DATA:  I have reviewed the data as listed   Chemistry      Component Value Date/Time   NA 140 05/29/2015 0843   NA 130* 05/20/2015 0300   K 4.1 05/29/2015 0843   K 3.3* 05/20/2015 0300   CL 98* 05/20/2015 0300   CO2 29 05/29/2015 0843   CO2 25 05/20/2015 0300   BUN 14.3 05/29/2015 0843   BUN 9 05/20/2015 0300   CREATININE 0.8 05/29/2015 0843   CREATININE 0.80 05/20/2015 0300      Component Value Date/Time   CALCIUM 10.2 05/29/2015 0843   CALCIUM 7.8* 05/20/2015 0300   ALKPHOS 157* 05/29/2015 0843   ALKPHOS 58 05/20/2015 0300   AST 33 05/29/2015 0843   AST 37 05/20/2015 0300   ALT 20 05/29/2015 0843   ALT 19 05/20/2015 0300   BILITOT <0.30 05/29/2015 0843   BILITOT 0.5 05/20/2015 0300       Lab Results  Component Value Date   WBC 4.9 07/10/2015   HGB 11.6 07/10/2015   HCT 35.0 07/10/2015   MCV 83.2 07/10/2015   PLT 228 07/10/2015   NEUTROABS  2.9 07/10/2015     ASSESSMENT & PLAN:  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:00 position, left axillary lymph node palpable, ER/PR negative, 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 5.32, T1 cN1 M0 stage II a clinical stage Treatment summary: Neo adjuvant chemotherapy with [place] for general every 3 weeks 6 cycles started 11/01/2014 completed 02/14/2015 Post-neo-adjuvant breast MRI 02/17/2015: No residual mass or abnormal enhancement, no enlarged lymph nodes Post-neo-adjuvant CT chest 02/18/2015: Interval resolution of the left breast mass and multiple enlarged left axillary and subpectoral lymph nodes Bilateral mastectomies 05/19/2015 Left: Radial scar, FA, UDH no malignancy 0/1 lymph node negative; right: FA, radial scar, UDH no malignancy  (pathologic complete response) [id number] ---------------------------------------------------------------------------------------------------------------------------------------------------- Current treatment: Herceptin maintenance Patient will start adjuvant radiation therapy  probably in the next 2 weeks  Chemotherapy-induced anemia: monitoring Rash underneath the breasts bilaterally extending into the axilla: his most certainly contact dermatitis. I will prescribe her Medrol Dosepak. I instructed her to call us next week. If it does not get better then we might have to treat her with acyclovir.  Return to clinic in 6 weeks for follow-up and in 3 weeks for Herceptin.   No orders of the defined types were placed in this encounter.   The patient has a good understanding of the overall plan. she agrees with it. she will call with any problems that may develop before the next visit here.   Rulon Eisenmenger, MD 07/10/2015

## 2015-07-10 NOTE — Telephone Encounter (Signed)
Patient is on recall with dr bensimhon and echo and she is aware that they will call her,other appointments made and avs printed for patient

## 2015-07-10 NOTE — Addendum Note (Signed)
Addended by: Prentiss Bells on: 07/10/2015 12:06 PM   Modules accepted: Medications

## 2015-07-11 ENCOUNTER — Ambulatory Visit: Payer: BLUE CROSS/BLUE SHIELD | Admitting: Physical Therapy

## 2015-07-11 DIAGNOSIS — M25611 Stiffness of right shoulder, not elsewhere classified: Secondary | ICD-10-CM

## 2015-07-11 DIAGNOSIS — M25612 Stiffness of left shoulder, not elsewhere classified: Secondary | ICD-10-CM | POA: Diagnosis not present

## 2015-07-11 DIAGNOSIS — Z9189 Other specified personal risk factors, not elsewhere classified: Secondary | ICD-10-CM

## 2015-07-11 NOTE — Therapy (Signed)
[facility] [address] [place], [place], [address] Phone: [phone]   Fax:  [phone]  Physical Therapy Treatment  Patient Details  Name: [name] [name] MRN: [id number] Date of Birth: [birth year] Referring Provider: Odis Luster   Encounter Date: 07/11/2015      PT End of Session - 07/11/15 1324    Visit Number 9   Number of Visits 17   Date for PT Re-Evaluation 08/14/15   PT Start Time 0900   PT Stop Time 0930   PT Time Calculation (min) 30 min   Activity Tolerance Patient tolerated treatment well   Behavior During Therapy [facility] for tasks assessed/performed      Past Medical History  Diagnosis Date  . Osteopenia   . Neurogenic bladder   . Wears contact lenses   . Family history of breast cancer   . Family history of colon cancer   . 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 Breast cancer (HCC) 2016    ER-/PR-/Her2+  . Migraines     takes metoprolol  for migraine headaches    Past Surgical History  Procedure Laterality Date  . Vein ligation      both legs  . Colonoscopy    . Upper gi endoscopy    . Portacath placement Right 10/29/2014    Procedure: INSERTION PORT-A-CATH WITH ULTRASOUND;  Surgeon: Harriette Bouillon, MD;  Location: [facility];  Service: General;  Laterality: Right;  . Breast surgery      rt, breast biopsy  . Nipple sparing mastectomy/sentinal lymph node biopsy/reconstruction/placement of tissue expander Bilateral 05/19/2015    Procedure: BILATERAL NIPPLE SPARING MASTECTOMY WITH LEFT AXILLARY SENTINAL LYMPH NODE BIOPSY;  Surgeon: Harriette Bouillon, MD;  Location: [facility];  Service: General;  Laterality: Bilateral;  . Breast reconstruction with placement of tissue expander and flex hd (acellular hydrated dermis) Bilateral 05/19/2015    Procedure: BILATERAL NIPPLE SPARING MASTECTOMY WITH LEFT AXILLARY SENTINAL LYMPH NODE BIOPSY;  Surgeon: Harriette Bouillon, MD;   Location: [facility];  Service: General;  Laterality: Bilateral;    There were no vitals filed for this visit.  Visit Diagnosis:  Shoulder stiffness, left - Plan: PT plan of care cert/re-cert  Shoulder stiffness, right - Plan: PT plan of care cert/re-cert  At risk for lymphedema - Plan: PT plan of care cert/re-cert      Subjective Assessment - 07/11/15 1315    Subjective Pt wants to have more PT to work on improving scar. She wants to be fully healed before she starts radiation Her rash is better, but she still has some on right side    Pertinent History Diagnosed 10/03/14 with left ER/PR negative, HER2 positive breast cancer.  She also has a positive left axillary node. Bilateral mastectomy with 2 nodes removed from the left side, none on the right.on Nov. 21 with immedicate silicone implants. Has had chemotherapy and will continue with target therapy till June and will see about radition therapy in  January.     Patient Stated Goals Improve shoulder ROM   Currently in Pain? No/denies                         [facility] Adult PT Treatment/Exercise - 07/11/15 0001    Manual Therapy   Manual Therapy Soft tissue mobilization;Myofascial release   Soft tissue mobilization Used mirror for visual feedback of scar tightness  performed soft tissue work to tightness in [employer]  and at lateral chest, scar massage to left breast scars    Myofascial Release continues with instructionion myofascial teachniques    Passive ROM PROM to left shoulder                    Short Term Clinic Goals - 06/13/15 1307    CC Short Term Goal  #1   Title Short term goals = long term goals             Long Term Clinic Goals - 07/11/15 1327    CC Long Term Goal  #1   Title Patient will improve left shoulder flexion range of motion to 125 degrees so reach up in her kitchen cabinets    Time 4   Status On-going   CC Long Term Goal  #2   Title Patient will improve right shoulder flexion range of  motion to 125 degrees to perform activities of daily living and household chores with greater ease   Time 4   Period Weeks   Status On-going   CC Long Term Goal  #3   Title Patient will improve left shoulder abduction to 120 degrees so that she can wash her hair with greater ease     Time 4   Status On-going   CC Long Term Goal  #4   Title Patient will improve right shoulder abduction to 120 degrees so that she can wash her hair with greater ease   Time 4   Period Weeks   Status On-going   CC Long Term Goal  #5   Title Patient will be independent in advanced home exercise program   Time 4   Status New   CC Long Term Goal  #6   Title pt will be independent in scar massage   Time 4   Period Weeks   Status New   Additional Goals   Additional Goals Yes            Plan - 07/11/15 1325    Clinical Impression Statement Pt says she needs more reinforcement for scar massage and wants to keep coming to PT for a few more weeks to make sure she is ready for radiation.  Renewal sent today    Pt will benefit from skilled therapeutic intervention in order to improve on the following deficits Decreased range of motion;Postural dysfunction   Rehab Potential Excellent   Clinical Impairments Affecting Rehab Potential continues with target therapy through IV every 3 weeks  Activity restriction with no massage.    PT Frequency 2x / week   PT Duration 4 weeks   PT Treatment/Interventions Therapeutic exercise;Patient/family education;Scar mobilization;Manual techniques;Passive range of motion   PT Next Visit Plan continue with scar massage and myofascial release to tightness at lateral chest . Progress to wall scaplar exercise with theraband when pt is ready.    Consulted and Agree with Plan of Care Patient        Problem List Patient Active Problem List   Diagnosis Date Noted  . Allergic dermatitis 06/19/2015  . Nausea without vomiting 12/30/2014  . Diarrhea 12/30/2014  . Dehydration  12/30/2014  . Hypotension 12/30/2014  . 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 External hemorrhoid 12/13/2014  . UTI (urinary tract infection) 11/16/2014  . Genetic testing 11/07/2014  . Migraine headache 11/06/2014  . Family history of breast cancer   . Family history of colon cancer   . Breast cancer of upper-outer quadrant of left female breast ([place])  10/11/2014   Donato Heinz. Owens Shark, PT  07/11/2015, 1:32 PM  [place], [place], [address] Phone: [phone]   Fax:  [phone]  Name: [name] [name] MRN: [id number] Date of Birth: Jan 31, 1961

## 2015-07-14 ENCOUNTER — Ambulatory Visit: Payer: BLUE CROSS/BLUE SHIELD | Admitting: Physical Therapy

## 2015-07-14 DIAGNOSIS — Z9189 Other specified personal risk factors, not elsewhere classified: Secondary | ICD-10-CM

## 2015-07-14 DIAGNOSIS — M25612 Stiffness of left shoulder, not elsewhere classified: Secondary | ICD-10-CM | POA: Diagnosis not present

## 2015-07-14 DIAGNOSIS — M25611 Stiffness of right shoulder, not elsewhere classified: Secondary | ICD-10-CM

## 2015-07-14 NOTE — Therapy (Signed)
[place], [place], [address] Phone: [phone]   Fax:  [phone]  Physical Therapy Treatment  Patient Details  Name: [name] [name] MRN: [id number] Date of Birth: 12-Dec-1960 Referring Provider: Harlow Mares   Encounter Date: 07/14/2015      PT End of Session - 07/14/15 0947    Visit Number 10   Number of Visits 17   Date for PT Re-Evaluation 08/14/15   PT Start Time 0800   PT Stop Time 0847   PT Time Calculation (min) 47 min   Activity Tolerance Patient tolerated treatment well   Behavior During Therapy [facility] for tasks assessed/performed      Past Medical History  Diagnosis Date  . Osteopenia   . Neurogenic bladder   . Wears contact lenses   . Family history of breast cancer   . Family history of colon cancer   . Breast cancer of upper-outer quadrant of right female breast ([place]) 10/11/2014  . Breast cancer ([place]) 2016    ER-/PR-/Her2+  . Migraines     takes metoprolol  for migraine headaches    Past Surgical History  Procedure Laterality Date  . Vein ligation      both legs  . Colonoscopy    . Upper gi endoscopy    . Portacath placement Right 10/29/2014    Procedure: INSERTION PORT-A-CATH WITH ULTRASOUND;  Surgeon: Erroll Luna, MD;  Location: [place];  Service: General;  Laterality: Right;  . Breast surgery      rt, breast biopsy  . Nipple sparing mastectomy/sentinal lymph node biopsy/reconstruction/placement of tissue expander Bilateral 05/19/2015    Procedure: BILATERAL NIPPLE SPARING MASTECTOMY WITH LEFT AXILLARY SENTINAL LYMPH NODE BIOPSY;  Surgeon: Erroll Luna, MD;  Location: [place];  Service: General;  Laterality: Bilateral;  . Breast reconstruction with placement of tissue expander and flex hd (acellular hydrated dermis) Bilateral 05/19/2015    Procedure: BILATERAL NIPPLE SPARING MASTECTOMY WITH LEFT AXILLARY SENTINAL LYMPH NODE BIOPSY;  Surgeon: Erroll Luna,  MD;  Location: [place];  Service: General;  Laterality: Bilateral;    There were no vitals filed for this visit.  Visit Diagnosis:  Shoulder stiffness, left  Shoulder stiffness, right  At risk for lymphedema      Subjective Assessment - 07/14/15 0803    Subjective I still need work on the scar mobilization and learning that.  Has much more painful pulling on left arm than right when moving that.  I'm starting to spasm more than I ever have, on the left chest.   Currently in Pain? No/denies            [facility] PT Assessment - 07/14/15 0001    AROM   Right Shoulder Flexion 118 Degrees  at beginnning of session   Right Shoulder ABduction 110 Degrees  avoiding scaption   Left Shoulder Flexion 118 Degrees   Left Shoulder ABduction 100 Degrees  avoiding scaption                     [facility] Adult PT Treatment/Exercise - 07/14/15 0001    Self-Care   Self-Care Other Self-Care Comments   Other Self-Care Comments  discussed that in this therapist's opinion, stretching should be her HEP priority right now to prepare for XRT, and that if she is not able to do everything daily, stretching should be what she chooses; also discussed how to get a compression sleeve (suggested [facility])   Shoulder Exercises: Supine  Other Supine Exercises with arms in approx. 90 degrees abduction, lower trunk rotation to feel stretch at chest   Other Supine Exercises supine over towel roll, active horizontal abduction x 10; then active "V" with arms x 10   Manual Therapy   Soft tissue mobilization at left axillary and mastectomy scar; also right mastectomy scar   Passive ROM in supine over towel roll, manual pect minor stretches; in supine, stretches of left and right shoulders passively into simulated position for radiation                   Short Term Clinic Goals - 06/13/15 1307    CC Short Term Goal  #1   Title Short term goals = long term goals             Long  Term Clinic Goals - 07/14/15 0951    CC Long Term Goal  #1   Title Patient will improve left shoulder flexion range of motion to 125 degrees so reach up in her kitchen cabinets    Status On-going   CC Long Term Goal  #2   Title Patient will improve right shoulder flexion range of motion to 125 degrees to perform activities of daily living and household chores with greater ease   Status On-going   CC Long Term Goal  #3   Title Patient will improve left shoulder abduction to 120 degrees so that she can wash her hair with greater ease     Status On-going   CC Long Term Goal  #4   Title Patient will improve right shoulder abduction to 120 degrees so that she can wash her hair with greater ease   Status On-going   CC Long Term Goal  #5   Title Patient will be independent in advanced home exercise program   Status On-going   CC Long Term Goal  #6   Title s   Status On-going            Plan - 07/14/15 0948    Clinical Impression Statement Patient may call radiation oncology to inquire about postponing that treatment; today it did appear that she could achieve positioning for this, and she was told this.  She does have limited ROM still, with some improvement bilaterally in flexion.  Abduction measurements look decreased, but perhaps patient was moving into scapular plane when measured last.  Currently she describes tight feeling at upper chest/shoulders with PROM, and not at incision areas.   Pt will benefit from skilled therapeutic intervention in order to improve on the following deficits Decreased range of motion;Postural dysfunction   Rehab Potential Excellent   PT Frequency 2x / week   PT Duration 4 weeks   PT Treatment/Interventions ADLs/Self Care Home Management;Therapeutic exercise;Patient/family education;Manual techniques;Passive range of motion   PT Next Visit Plan suggest focus on PROM to both shoulders   Recommended Other Services faxed compression sleeve prescription to Dr.  Lindi Adie for signature   Consulted and Agree with Plan of Care Patient        Problem List Patient Active Problem List   Diagnosis Date Noted  . Allergic dermatitis 06/19/2015  . Nausea without vomiting 12/30/2014  . Diarrhea 12/30/2014  . Dehydration 12/30/2014  . Hypotension 12/30/2014  . 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 External hemorrhoid 12/13/2014  . UTI (urinary tract infection) 11/16/2014  . Genetic testing 11/07/2014  . Migraine headache 11/06/2014  . Family history of breast cancer   . Family  history of colon cancer   . Breast cancer of upper-outer quadrant of left female breast ([place]) 10/11/2014    SALISBURY,DONNA 07/14/2015, 9:56 AM  [place], [place], [address] Phone: [phone]   Fax:  [phone]  Name: [name] [name] MRN: [id number] Date of Birth: [birth year]    [name], PT 07/14/2015 9:56 AM

## 2015-07-17 ENCOUNTER — Ambulatory Visit: Payer: BLUE CROSS/BLUE SHIELD | Admitting: Physical Therapy

## 2015-07-17 ENCOUNTER — Encounter: Payer: Self-pay | Admitting: Physical Therapy

## 2015-07-17 ENCOUNTER — Ambulatory Visit: Payer: BLUE CROSS/BLUE SHIELD | Admitting: Radiation Oncology

## 2015-07-17 DIAGNOSIS — M25612 Stiffness of left shoulder, not elsewhere classified: Secondary | ICD-10-CM

## 2015-07-17 DIAGNOSIS — M25611 Stiffness of right shoulder, not elsewhere classified: Secondary | ICD-10-CM

## 2015-07-17 DIAGNOSIS — C50412 Malignant neoplasm of upper-outer quadrant of left female breast: Secondary | ICD-10-CM

## 2015-07-17 DIAGNOSIS — Z9189 Other specified personal risk factors, not elsewhere classified: Secondary | ICD-10-CM

## 2015-07-17 NOTE — Therapy (Signed)
[place], [place], [address] Phone: [phone]   Fax:  [phone]  Physical Therapy Treatment  Patient Details  Name: [name] [name] MRN: [id number] Date of Birth: 1960/11/09 Referring Provider: Harlow Mares   Encounter Date: 07/17/2015      PT End of Session - 07/17/15 1201    Visit Number 1   Number of Visits 17   Date for PT Re-Evaluation 08/14/15   PT Start Time 1107   PT Stop Time 1202   PT Time Calculation (min) 55 min   Activity Tolerance Patient tolerated treatment well   Behavior During Therapy [facility] for tasks assessed/performed      Past Medical History  Diagnosis Date  . Osteopenia   . Neurogenic bladder   . Wears contact lenses   . Family history of breast cancer   . Family history of colon cancer   . Breast cancer of upper-outer quadrant of right female breast ([place]) 10/11/2014  . Breast cancer ([place]) 2016    ER-/PR-/Her2+  . Migraines     takes metoprolol  for migraine headaches    Past Surgical History  Procedure Laterality Date  . Vein ligation      both legs  . Colonoscopy    . Upper gi endoscopy    . Portacath placement Right 10/29/2014    Procedure: INSERTION PORT-A-CATH WITH ULTRASOUND;  Surgeon: Erroll Luna, MD;  Location: [place];  Service: General;  Laterality: Right;  . Breast surgery      rt, breast biopsy  . Nipple sparing mastectomy/sentinal lymph node biopsy/reconstruction/placement of tissue expander Bilateral 05/19/2015    Procedure: BILATERAL NIPPLE SPARING MASTECTOMY WITH LEFT AXILLARY SENTINAL LYMPH NODE BIOPSY;  Surgeon: Erroll Luna, MD;  Location: [place];  Service: General;  Laterality: Bilateral;  . Breast reconstruction with placement of tissue expander and flex hd (acellular hydrated dermis) Bilateral 05/19/2015    Procedure: BILATERAL NIPPLE SPARING MASTECTOMY WITH LEFT AXILLARY SENTINAL LYMPH NODE BIOPSY;  Surgeon: Erroll Luna, MD;   Location: [place];  Service: General;  Laterality: Bilateral;    There were no vitals filed for this visit.  Visit Diagnosis:  Shoulder stiffness, left  Shoulder stiffness, right  At risk for lymphedema  Carcinoma of upper-outer quadrant of left female breast [facility])      Subjective Assessment - 07/17/15 1109    Subjective We keep talking about the incision and maybe that's important but hte incision doesn't bother me so I want to spend our time focusing on stretching.  I went back to work this week and it's 15 hours a week right now and that felt good to get back.    Pertinent History Diagnosed 10/03/14 with left ER/PR negative, HER2 positive breast cancer.  She also has a positive left axillary node. Bilateral mastectomy with 2 nodes removed from the left side, none on the right.on Nov. 21 with immedicate silicone implants. Has had chemotherapy and will continue with target therapy till June and will see about radition therapy in  January.     Patient Stated Goals Improve shoulder ROM   Currently in Pain? No/denies                         [facility] Adult PT Treatment/Exercise - 07/17/15 0001    Shoulder Exercises: Pulleys   Flexion 2 minutes   Flexion Limitations verbal cues for posture   ABduction 2 minutes   ABduction Limitations verbal cues  for proper technique   Shoulder Exercises: Therapy Ball   Flexion 10 reps   ABduction 10 reps   Shoulder Exercises: ROM/Strengthening   "W" Arms Standing with back against wall x3 with 15 second holds   Shoulder Exercises: Stretch   External Rotation Stretch 3 reps;10 seconds  BUE one at a time with forearm on doorframe rotating body    Manual Therapy   Manual Therapy Passive ROM;Neural Stretch   Passive ROM in supine over towel roll, manual pect minor stretches; in supine, stretches of left and right shoulders passively into simulated position for radiation   Neural Stretch Passive neural stretch BUE in supine to pt tolerance                    Short Term Clinic Goals - 06/13/15 1307    CC Short Term Goal  #1   Title Short term goals = long term goals             Long Term Clinic Goals - 07/14/15 0951    CC Long Term Goal  #1   Title Patient will improve left shoulder flexion range of motion to 125 degrees so reach up in her kitchen cabinets    Status On-going   CC Long Term Goal  #2   Title Patient will improve right shoulder flexion range of motion to 125 degrees to perform activities of daily living and household chores with greater ease   Status On-going   CC Long Term Goal  #3   Title Patient will improve left shoulder abduction to 120 degrees so that she can wash her hair with greater ease     Status On-going   CC Long Term Goal  #4   Title Patient will improve right shoulder abduction to 120 degrees so that she can wash her hair with greater ease   Status On-going   CC Long Term Goal  #5   Title Patient will be independent in advanced home exercise program   Status On-going   CC Long Term Goal  #6   Title s   Status On-going            Plan - 07/17/15 1202    Clinical Impression Statement Patient is doing well with PROM but is still very tight at end ROM and it appears to be primarily due to pectoralis tightness and neural tension.  With PROM she reports symptoms down her arm indicating neural tension.  She tolerated neural stretching well today and will benefit from continued manual treatment to better tolerate radiation positioning.   Pt will benefit from skilled therapeutic intervention in order to improve on the following deficits Decreased range of motion;Postural dysfunction   Rehab Potential Excellent   Clinical Impairments Affecting Rehab Potential continues with target therapy through IV every 3 weeks  Activity restriction with no massage.    PT Frequency 2x / week   PT Duration 4 weeks   PT Treatment/Interventions ADLs/Self Care Home Management;Therapeutic  exercise;Patient/family education;Manual techniques;Passive range of motion   PT Next Visit Plan Continue PROM and neural stretching   Consulted and Agree with Plan of Care Patient        Problem List Patient Active Problem List   Diagnosis Date Noted  . Allergic dermatitis 06/19/2015  . Nausea without vomiting 12/30/2014  . Diarrhea 12/30/2014  . Dehydration 12/30/2014  . Hypotension 12/30/2014  . 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 External hemorrhoid 12/13/2014  . UTI (urinary tract  infection) 11/16/2014  . Genetic testing 11/07/2014  . Migraine headache 11/06/2014  . Family history of breast cancer   . Family history of colon cancer   . Breast cancer of upper-outer quadrant of left female breast ([place]) 10/11/2014    Annia Friendly, PT 07/17/2015 12:11 PM  [place], [place], [address] Phone: [phone]   Fax:  [phone]  Name: REONNA FINLAYSON MRN: [id number] Date of Birth: 11-Nov-1960

## 2015-07-21 ENCOUNTER — Ambulatory Visit: Payer: BLUE CROSS/BLUE SHIELD | Admitting: Physical Therapy

## 2015-07-24 ENCOUNTER — Ambulatory Visit: Payer: BLUE CROSS/BLUE SHIELD

## 2015-07-24 ENCOUNTER — Ambulatory Visit
Admission: RE | Admit: 2015-07-24 | Payer: BLUE CROSS/BLUE SHIELD | Source: Ambulatory Visit | Admitting: Radiation Oncology

## 2015-07-24 DIAGNOSIS — M25611 Stiffness of right shoulder, not elsewhere classified: Secondary | ICD-10-CM

## 2015-07-24 DIAGNOSIS — M25612 Stiffness of left shoulder, not elsewhere classified: Secondary | ICD-10-CM | POA: Diagnosis not present

## 2015-07-24 DIAGNOSIS — Z9189 Other specified personal risk factors, not elsewhere classified: Secondary | ICD-10-CM

## 2015-07-24 DIAGNOSIS — C50412 Malignant neoplasm of upper-outer quadrant of left female breast: Secondary | ICD-10-CM

## 2015-07-24 NOTE — Therapy (Signed)
[facility] [address] [place], [place], [address] Phone: [phone]   Fax:  [phone]  Physical Therapy Treatment  Patient Details  Name: [name] [name] MRN: [id number] Date of Birth: 01/05/1961 Referring Provider: Bowers   Encounter Date: 07/24/2015      PT End of Session - 07/24/15 0944    Visit Number 12   Number of Visits 17   Date for PT Re-Evaluation 08/14/15   PT Start Time 0852   PT Stop Time 0935   PT Time Calculation (min) 43 min   Activity Tolerance Patient tolerated treatment well   Behavior During Therapy WFL for tasks assessed/performed      Past Medical History  Diagnosis Date  . Osteopenia   . Neurogenic bladder   . Wears contact lenses   . Family history of breast cancer   . Family history of colon cancer   . 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 Breast cancer (HCC) 2016    ER-/PR-/Her2+  . Migraines     takes metoprolol  for migraine headaches    Past Surgical History  Procedure Laterality Date  . Vein ligation      both legs  . Colonoscopy    . Upper gi endoscopy    . Portacath placement Right 10/29/2014    Procedure: INSERTION PORT-A-CATH WITH ULTRASOUND;  Surgeon: Thomas Cornett, MD;  Location: [facility];  Service: General;  Laterality: Right;  . Breast surgery      rt, breast biopsy  . Nipple sparing mastectomy/sentinal lymph node biopsy/reconstruction/placement of tissue expander Bilateral 05/19/2015    Procedure: BILATERAL NIPPLE SPARING MASTECTOMY WITH LEFT AXILLARY SENTINAL LYMPH NODE BIOPSY;  Surgeon: Thomas Cornett, MD;  [facility];  Service: General;  Laterality: Bilateral;  . Breast reconstruction with placement of tissue expander and flex hd (acellular hydrated dermis) Bilateral 05/19/2015    Procedure: BILATERAL NIPPLE SPARING MASTECTOMY WITH LEFT AXILLARY SENTINAL LYMPH NODE BIOPSY;  Surgeon: Thomas Cornett,  MD;  [facility];  Service: General;  Laterality: Bilateral;    There were no vitals filed for this visit.  Visit Diagnosis:  Shoulder stiffness, left  Shoulder stiffness, right  At risk for lymphedema  Carcinoma of upper-outer quadrant of left female breast (HCC)      Subjective Assessment - 07/24/15 0854    Subjective I have a little cold, but I'm feeling better now. It was worse at the beginning of the week, that's why I cancelled my appointment Monday. My simulation was rescheduled from today to Tuesday next week.    Pertinent History Diagnosed 10/03/14 with left ER/PR negative, HER2 positive breast cancer.  She also has a positive left axillary node. Bilateral mastectomy with 2 nodes removed from the left side, none on the right.on Nov. 21 with immedicate silicone implants. Has had chemotherapy and will continue with target therapy till June and will see about radition therapy in  January.     Currently in Pain? No/denies                         [facility] Adult PT Treatment/Exercise - 07/24/15 0001    Manual Therapy   Passive ROM in supine over towel roll, manual pect minor stretches; in supine, stretches of left and right shoulders passively into simulated position for radiation   Neural Stretch Passive neural stretch BUE in supine to pt tolerance                     Short Term Clinic Goals - 06/13/15 1307    CC Short Term Goal  #1   Title Short term goals = long term goals             Long Term Clinic Goals - 07/24/15 [phone]    CC Long Term Goal  #1   Title Patient will improve left shoulder flexion range of motion to 125 degrees so reach up in her kitchen cabinets    Status On-going   CC Long Term Goal  #2   Title Patient will improve right shoulder flexion range of motion to 125 degrees to perform activities of daily living and household chores with greater ease   Status On-going   CC Long Term Goal  #3   Title Patient will improve left  shoulder abduction to 120 degrees so that she can wash her hair with greater ease     Status On-going   CC Long Term Goal  #4   Title Patient will improve right shoulder abduction to 120 degrees so that she can wash her hair with greater ease   Status On-going   CC Long Term Goal  #5   Title Patient will be independent in advanced home exercise program   Status On-going   CC Long Term Goal  #6   Title Pt will be independent with scar massage   Status On-going            Plan - 07/24/15 0949    Clinical Impression Statement Though still presenting with tightness at end ROM, pt did seem to have some improvement with PROM by end of session today. Pt did not c/o of symptopms as bad today during PROM as she reports she has been working on neural tension stretching at home since lsat session.    Pt will benefit from skilled therapeutic intervention in order to improve on the following deficits Decreased range of motion;Postural dysfunction   Rehab Potential Excellent   Clinical Impairments Affecting Rehab Potential continues with target therapy through IV every 3 weeks  Activity restriction with no massage.    PT Frequency 2x / week   PT Duration 4 weeks   PT Treatment/Interventions ADLs/Self Care Home Management;Therapeutic exercise;Patient/family education;Manual techniques;Passive range of motion   PT Next Visit Plan Continue PROM and neural stretching, measure ROM for assess next visit.    Consulted and Agree with Plan of Care Patient        Problem List Patient Active Problem List   Diagnosis Date Noted  . Allergic dermatitis 06/19/2015  . Nausea without vomiting 12/30/2014  . Diarrhea 12/30/2014  . Dehydration 12/30/2014  . Hypotension 12/30/2014  . 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 External hemorrhoid 12/13/2014  . UTI (urinary tract infection) 11/16/2014  . Genetic testing 11/07/2014  . Migraine headache 11/06/2014  . Family history of breast  cancer   . Family history of colon cancer   . Breast cancer of upper-outer quadrant of left female breast ([place]) 10/11/2014    Otelia Limes, PTA 07/24/2015, 9:53 AM  [place], [place], [address] Phone: [phone]   Fax:  [phone]  Name: [name] [name] MRN: [id number] Date of Birth: 12-04-1960

## 2015-07-28 ENCOUNTER — Ambulatory Visit: Payer: BLUE CROSS/BLUE SHIELD | Admitting: Physical Therapy

## 2015-07-28 DIAGNOSIS — M25612 Stiffness of left shoulder, not elsewhere classified: Secondary | ICD-10-CM

## 2015-07-28 DIAGNOSIS — M25611 Stiffness of right shoulder, not elsewhere classified: Secondary | ICD-10-CM

## 2015-07-28 NOTE — Therapy (Signed)
[facility] [address] [place], [place], [address] Phone: [phone]   Fax:  [phone]  Physical Therapy Treatment  Patient Details  Name: [name] [name] MRN: [id number] Date of Birth: 07/04/1960 Referring Provider: Bowers   Encounter Date: 07/28/2015      PT End of Session - 07/28/15 0938    Visit Number 13   Number of Visits 17   Date for PT Re-Evaluation 08/14/15   PT Start Time 0852   PT Stop Time 0933   PT Time Calculation (min) 41 min   Activity Tolerance Patient tolerated treatment well   Behavior During Therapy WFL for tasks assessed/performed      Past Medical History  Diagnosis Date  . Osteopenia   . Neurogenic bladder   . Wears contact lenses   . Family history of breast cancer   . Family history of colon cancer   . 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 Breast cancer (HCC) 2016    ER-/PR-/Her2+  . Migraines     takes metoprolol  for migraine headaches    Past Surgical History  Procedure Laterality Date  . Vein ligation      both legs  . Colonoscopy    . Upper gi endoscopy    . Portacath placement Right 10/29/2014    Procedure: INSERTION PORT-A-CATH WITH ULTRASOUND;  Surgeon: [name] Cornett, MD;  Location: [facility];  Service: General;  Laterality: Right;  . Breast surgery      rt, breast biopsy  . Nipple sparing mastectomy/sentinal lymph node biopsy/reconstruction/placement of tissue expander Bilateral 05/19/2015    Procedure: BILATERAL NIPPLE SPARING MASTECTOMY WITH LEFT AXILLARY SENTINAL LYMPH NODE BIOPSY;  Surgeon: [name] Cornett, MD;  [facility];  Service: General;  Laterality: Bilateral;  . Breast reconstruction with placement of tissue expander and flex hd (acellular hydrated dermis) Bilateral 05/19/2015    Procedure: BILATERAL NIPPLE SPARING MASTECTOMY WITH LEFT AXILLARY SENTINAL LYMPH NODE BIOPSY;  Surgeon: [name] Cornett,  MD;  [facility];  Service: General;  Laterality: Bilateral;    There were no vitals filed for this visit.  Visit Diagnosis:  Shoulder stiffness, left  Shoulder stiffness, right      Subjective Assessment - 07/28/15 0854    Subjective "I've had the flu.  I'm feeling better now."  "I get my simulation done tomorrow."  Did stretching some days while sick.   Currently in Pain? No/denies            [facility] PT Assessment - 07/28/15 0001    AROM   Right Shoulder Flexion 123 Degrees   Right Shoulder ABduction 118 Degrees   Left Shoulder Flexion 119 Degrees   Left Shoulder ABduction 107 Degrees                     [facility] Adult PT Treatment/Exercise - 07/28/15 0001    Manual Therapy   Myofascial Release right and left UE myofascial pulling   Passive ROM to each shoulder for IR, ER, abduction, flexion, and then arms up into radiation treatment positioning   Neural Stretch Passive neural stretch BUE in supine to pt tolerance                   Short Term Clinic Goals - 06/13/15 1307    CC Short Term Goal  #1   Title Short term goals = long term goals               [facility] Goals - 07/28/15 [phone]    CC Long Term Goal  #1   Title Patient will improve left shoulder flexion range of motion to 125 degrees so reach up in her kitchen cabinets    Baseline 119 on 07/28/15   Status Partially Met   CC Long Term Goal  #2   Title Patient will improve right shoulder flexion range of motion to 125 degrees to perform activities of daily living and household chores with greater ease   Baseline 123 on 07/28/15   Status Partially Met   CC Long Term Goal  #3   Title Patient will improve left shoulder abduction to 120 degrees so that she can wash her hair with greater ease     Status On-going   CC Long Term Goal  #4   Title Patient will improve right shoulder abduction to 120 degrees so that she can wash her hair with greater ease   Status On-going   CC Long  Term Goal  #5   Title Patient will be independent in advanced home exercise program   Status On-going            Plan - 07/28/15 9604    Clinical Impression Statement Patient did well to maintain and in some places increase shoulder ROM this past week while she has been slick with the flu.  She has radiation simulation tomorrow and it appears she will be able to get into position for that.   Pt will benefit from skilled therapeutic intervention in order to improve on the following deficits Decreased range of motion;Postural dysfunction   Rehab Potential Excellent   Clinical Impairments Affecting Rehab Potential continues with target therapy through IV every 3 weeks  Activity restriction with no massage.  Starts radiation with simulaiton the week of 07/28/15.   PT Frequency 2x / week   PT Duration 4 weeks   PT Treatment/Interventions Passive range of motion;Manual techniques   PT Next Visit Plan continue PROM and manual techniques; therapeutic exercise for ROM   Consulted and Agree with Plan of Care Patient        Problem List Patient Active Problem List   Diagnosis Date Noted  . Allergic dermatitis 06/19/2015  . Nausea without vomiting 12/30/2014  . Diarrhea 12/30/2014  . Dehydration 12/30/2014  . Hypotension 12/30/2014  . 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 External hemorrhoid 12/13/2014  . UTI (urinary tract infection) 11/16/2014  . Genetic testing 11/07/2014  . Migraine headache 11/06/2014  . Family history of breast cancer   . Family history of colon cancer   . Breast cancer of upper-outer quadrant of left female breast ([place]) 10/11/2014    [name] [name] 07/28/2015, 9:42 AM  [place], [place], [address] Phone: [phone]   Fax:  [phone]  Name: [name] [name] MRN: [id number] Date of Birth: Oct 31, 1960    [name] [name], PT 07/28/2015 9:42 AM

## 2015-07-29 ENCOUNTER — Ambulatory Visit
Admission: RE | Admit: 2015-07-29 | Discharge: 2015-07-29 | Disposition: A | Discharge status: Home / Self Care | Payer: BLUE CROSS/BLUE SHIELD | Source: Ambulatory Visit | Attending: Radiation Oncology | Admitting: Radiation Oncology

## 2015-07-29 DIAGNOSIS — Z510 Encounter for antineoplastic radiation therapy: Secondary | ICD-10-CM | POA: Diagnosis not present

## 2015-07-29 DIAGNOSIS — C50412 Malignant neoplasm of upper-outer quadrant of left female breast: Secondary | ICD-10-CM

## 2015-07-31 ENCOUNTER — Ambulatory Visit: Payer: BLUE CROSS/BLUE SHIELD | Attending: Plastic Surgery

## 2015-07-31 ENCOUNTER — Encounter: Payer: Self-pay | Admitting: General Practice

## 2015-07-31 ENCOUNTER — Ambulatory Visit (HOSPITAL_BASED_OUTPATIENT_CLINIC_OR_DEPARTMENT_OTHER): Payer: BLUE CROSS/BLUE SHIELD

## 2015-07-31 ENCOUNTER — Other Ambulatory Visit: Payer: Self-pay

## 2015-07-31 VITALS — BP 111/64 | HR 60 | Temp 98.0°F | Resp 20

## 2015-07-31 DIAGNOSIS — D6481 Anemia due to antineoplastic chemotherapy: Secondary | ICD-10-CM

## 2015-07-31 DIAGNOSIS — C50412 Malignant neoplasm of upper-outer quadrant of left female breast: Secondary | ICD-10-CM | POA: Diagnosis present

## 2015-07-31 DIAGNOSIS — Z5111 Encounter for antineoplastic chemotherapy: Secondary | ICD-10-CM

## 2015-07-31 DIAGNOSIS — M25611 Stiffness of right shoulder, not elsewhere classified: Secondary | ICD-10-CM

## 2015-07-31 DIAGNOSIS — Z9189 Other specified personal risk factors, not elsewhere classified: Secondary | ICD-10-CM

## 2015-07-31 DIAGNOSIS — C773 Secondary and unspecified malignant neoplasm of axilla and upper limb lymph nodes: Secondary | ICD-10-CM | POA: Diagnosis not present

## 2015-07-31 DIAGNOSIS — M25612 Stiffness of left shoulder, not elsewhere classified: Secondary | ICD-10-CM

## 2015-07-31 DIAGNOSIS — T451X5A Adverse effect of antineoplastic and immunosuppressive drugs, initial encounter: Principal | ICD-10-CM

## 2015-07-31 MED ORDER — TRASTUZUMAB CHEMO INJECTION 440 MG
6.0000 mg/kg | Freq: Once | INTRAVENOUS | Status: AC
  Administered 2015-07-31: 294 mg via INTRAVENOUS
  Filled 2015-07-31: qty 14

## 2015-07-31 MED ORDER — DIPHENHYDRAMINE HCL 25 MG PO CAPS: 50.0000 mg | ORAL_CAPSULE | Freq: Once | ORAL | Status: DC

## 2015-07-31 MED ORDER — HEPARIN SOD (PORK) LOCK FLUSH 100 UNIT/ML IV SOLN
500.0000 [IU] | Freq: Once | INTRAVENOUS | Status: AC | PRN
  Administered 2015-07-31: 500 [IU]
  Filled 2015-07-31: qty 5

## 2015-07-31 MED ORDER — SODIUM CHLORIDE 0.9 % IV SOLN
Freq: Once | INTRAVENOUS | Status: AC
  Administered 2015-07-31: 10:00:00 via INTRAVENOUS

## 2015-07-31 MED ORDER — ACETAMINOPHEN 325 MG PO TABS: 650.0000 mg | ORAL_TABLET | Freq: Once | ORAL | Status: DC

## 2015-07-31 MED ORDER — SODIUM CHLORIDE 0.9 % IJ SOLN
10.0000 mL | INTRAMUSCULAR | Status: DC | PRN
  Administered 2015-07-31: 10 mL
  Filled 2015-07-31: qty 10

## 2015-07-31 NOTE — Patient Instructions (Signed)
[facility] Discharge Instructions for Patients Receiving Chemotherapy  Today you received the following chemotherapy agents herceptin   If you develop nausea and vomiting that is not controlled by your nausea medication, call the clinic.   BELOW ARE SYMPTOMS THAT SHOULD BE REPORTED IMMEDIATELY:  *FEVER GREATER THAN 100.5 F  *CHILLS WITH OR WITHOUT FEVER  NAUSEA AND VOMITING THAT IS NOT CONTROLLED WITH YOUR NAUSEA MEDICATION  *UNUSUAL SHORTNESS OF BREATH  *UNUSUAL BRUISING OR BLEEDING  TENDERNESS IN MOUTH AND THROAT WITH OR WITHOUT PRESENCE OF ULCERS  *URINARY PROBLEMS  *BOWEL PROBLEMS  UNUSUAL RASH Items with * indicate a potential emergency and should be followed up as soon as possible.  Feel free to call the clinic you have any questions or concerns. The clinic phone number is [phone].  Please show the CHEMO ALERT CARD at check-in to the Emergency Department and triage nurse.   

## 2015-07-31 NOTE — Progress Notes (Signed)
Spiritual Care Note  Received page from pt's chemo RN [name] for chaplain visit per pt request.  Met with [name] [name] in infusion rm, providing reflective listening as she shared and processed updates about several recent stressors.  Assisted with pastoral reflection as she struggles with human suffering (in her life and others').  She is using humor, perspective, and prayer to cope; our encounter was rich with laughter and thanksgiving in the midst of new and ongoing hardships.  She deeply values prayer and divine guidance as part of meaning-making and healing.  [name] [name] is aware of ongoing chaplain availability and plans to f/u at her next appt, but lease also page as needs arise/circumstances change.  Thank you.  [place], [place], [facility] Pager [phone] Voicemail  [phone]

## 2015-07-31 NOTE — Addendum Note (Signed)
Addended by: Prentiss Bells on: 07/31/2015 03:29 PM   Modules accepted: Orders

## 2015-07-31 NOTE — Therapy (Signed)
[place], [place], [address] Phone: [phone]   Fax:  [phone]  Physical Therapy Treatment  Patient Details  Name: [name] [name] MRN: [id number] Date of Birth: 1960/07/24 Referring Provider: Harlow Mares   Encounter Date: 07/31/2015      PT End of Session - 07/31/15 0933    Visit Number 14   Number of Visits 17   Date for PT Re-Evaluation 08/14/15   PT Start Time 0851   PT Stop Time 0931   PT Time Calculation (min) 40 min   Activity Tolerance Patient tolerated treatment well   Behavior During Therapy [facility] for tasks assessed/performed      Past Medical History  Diagnosis Date  . Osteopenia   . Neurogenic bladder   . Wears contact lenses   . Family history of breast cancer   . Family history of colon cancer   . Breast cancer of upper-outer quadrant of right female breast ([place]) 10/11/2014  . Breast cancer ([place]) 2016    ER-/PR-/Her2+  . Migraines     takes metoprolol  for migraine headaches    Past Surgical History  Procedure Laterality Date  . Vein ligation      both legs  . Colonoscopy    . Upper gi endoscopy    . Portacath placement Right 10/29/2014    Procedure: INSERTION PORT-A-CATH WITH ULTRASOUND;  Surgeon: Erroll Luna, MD;  Location: [facility];  Service: General;  Laterality: Right;  . Breast surgery      rt, breast biopsy  . Nipple sparing mastectomy/sentinal lymph node biopsy/reconstruction/placement of tissue expander Bilateral 05/19/2015    Procedure: BILATERAL NIPPLE SPARING MASTECTOMY WITH LEFT AXILLARY SENTINAL LYMPH NODE BIOPSY;  Surgeon: Erroll Luna, MD;  Location: [facility];  Service: General;  Laterality: Bilateral;  . Breast reconstruction with placement of tissue expander and flex hd (acellular hydrated dermis) Bilateral 05/19/2015    Procedure: BILATERAL NIPPLE SPARING MASTECTOMY WITH LEFT AXILLARY SENTINAL LYMPH NODE BIOPSY;  Surgeon: Erroll Luna, MD;   Location: [facility];  Service: General;  Laterality: Bilateral;    There were no vitals filed for this visit.  Visit Diagnosis:  Shoulder stiffness, left  Shoulder stiffness, right  At risk for lymphedema      Subjective Assessment - 07/31/15 0901    Subjective My simulation was yesterday and it did not go well. It really hurt alot but they were able to get what they needed and I start next Wednesday 08/06/15. the pain didn't last though, it eased off after i wasn't in that position any longer.    Pertinent History Diagnosed 10/03/14 with left ER/PR negative, HER2 positive breast cancer.  She also has a positive left axillary node. Bilateral mastectomy with 2 nodes removed from the left side, none on the right.on Nov. 21 with immedicate silicone implants. Has had chemotherapy and will continue with target therapy till June and will see about radition therapy in  January.     Patient Stated Goals Improve shoulder ROM                         [facility] Adult PT Treatment/Exercise - 07/31/15 0001    Shoulder Exercises: Pulleys   Flexion 2 minutes   ABduction 2 minutes   Shoulder Exercises: Therapy Ball   Flexion 10 reps   Shoulder Exercises: ROM/Strengthening   Other ROM/Strengthening Exercises Finger ladderbil abduction 6-7 reps each UE   Manual Therapy  Myofascial Release left UE myofascial pulling   Passive ROM In supine to Lt shoulder for IR, ER, abduction, flexion, and then arms up into radiation treatment positioning   Neural Stretch Passive neural stretch Lt UE in supine to pt tolerance                   Short Term Clinic Goals - 06/13/15 1307    CC Short Term Goal  #1   Title Short term goals = long term goals             Long Term Clinic Goals - 07/28/15 [phone]    CC Long Term Goal  #1   Title Patient will improve left shoulder flexion range of motion to 125 degrees so reach up in her kitchen cabinets    Baseline 119 on 07/28/15   Status Partially  Met   CC Long Term Goal  #2   Title Patient will improve right shoulder flexion range of motion to 125 degrees to perform activities of daily living and household chores with greater ease   Baseline 123 on 07/28/15   Status Partially Met   CC Long Term Goal  #3   Title Patient will improve left shoulder abduction to 120 degrees so that she can wash her hair with greater ease     Status On-going   CC Long Term Goal  #4   Title Patient will improve right shoulder abduction to 120 degrees so that she can wash her hair with greater ease   Status On-going   CC Long Term Goal  #5   Title Patient will be independent in advanced home exercise program   Status On-going            Plan - 07/31/15 0933    Clinical Impression Statement Pt was able to get into position for radiation though it ended being painful due to being in the position for s long. For her daily appointments she should be fine as it won't be for a prolonged period. Her PROM today was improved and pt tolerated this well. Pt will benefit from continuing PT at this point to help her regain as much ROM as possible before beginnning radiation.    Pt will benefit from skilled therapeutic intervention in order to improve on the following deficits Decreased range of motion;Postural dysfunction   Rehab Potential Excellent   Clinical Impairments Affecting Rehab Potential continues with target therapy through IV every 3 weeks  Activity restriction with no massage.  Starts radiation with simulaiton the week of 07/28/15.   PT Frequency 2x / week   PT Duration 4 weeks   PT Treatment/Interventions Passive range of motion;Manual techniques   PT Next Visit Plan continue PROM and manual techniques; therapeutic exercise for ROM focusing a little more on Lt due to radiaiton.   Consulted and Agree with Plan of Care Patient        Problem List Patient Active Problem List   Diagnosis Date Noted  . Allergic dermatitis 06/19/2015  . Nausea  without vomiting 12/30/2014  . Diarrhea 12/30/2014  . Dehydration 12/30/2014  . Hypotension 12/30/2014  . 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 External hemorrhoid 12/13/2014  . UTI (urinary tract infection) 11/16/2014  . Genetic testing 11/07/2014  . Migraine headache 11/06/2014  . Family history of breast cancer   . Family history of colon cancer   . Breast cancer of upper-outer quadrant of left female breast ([place]) 10/11/2014    Collie Siad  Lelon Frohlich, PTA 07/31/2015, 9:37 AM  [place], [place], [address] Phone: [phone]   Fax:  [phone]  Name: SHAKIYA MCNEARY MRN: [id number] Date of Birth: 1960/09/15

## 2015-08-04 ENCOUNTER — Ambulatory Visit: Payer: BLUE CROSS/BLUE SHIELD

## 2015-08-04 DIAGNOSIS — M25612 Stiffness of left shoulder, not elsewhere classified: Secondary | ICD-10-CM

## 2015-08-04 DIAGNOSIS — C50412 Malignant neoplasm of upper-outer quadrant of left female breast: Secondary | ICD-10-CM

## 2015-08-04 DIAGNOSIS — M25611 Stiffness of right shoulder, not elsewhere classified: Secondary | ICD-10-CM

## 2015-08-04 DIAGNOSIS — Z510 Encounter for antineoplastic radiation therapy: Secondary | ICD-10-CM | POA: Diagnosis not present

## 2015-08-04 DIAGNOSIS — Z9189 Other specified personal risk factors, not elsewhere classified: Secondary | ICD-10-CM

## 2015-08-04 NOTE — Progress Notes (Signed)
  Radiation Oncology         [phone] ________________________________  Name: [name] [name] MRN: [id number]  Date: 07/29/2015  DOB: [birth year]  SIMULATION AND TREATMENT PLANNING NOTE    ICD-9-CM ICD-10-CM   1. 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  Identity was confirmed.  All relevant records and images related to the planned course of therapy were reviewed.  The patient freely provided informed written consent to proceed with treatment after reviewing the details related to the planned course of therapy. The consent form was witnessed and verified by the simulation staff.  Then, the patient was set-up in a stable reproducible  supine position for radiation therapy.  CT images were obtained.  Surface markings were placed.  The CT images were loaded into the planning software.  Then the target and avoidance structures were contoured.  Treatment planning then occurred.  The radiation prescription was entered and confirmed.  Then, I designed and supervised the construction of a total of 5 medically necessary complex treatment devices.  I have requested : 3D Simulation  I have requested a DVH of the following structures: heart lungs, esophagus, spinal cord.  I have ordered:dose calc.  PLAN:  The patient will receive 45 Gy in 25 fractions using a 4 field technique. Consideration for chest wall boost to approximately 50.4 gray will also be considered.  ________________________________  -----------------------------------  Blair Promise, PhD, MD

## 2015-08-04 NOTE — Therapy (Signed)
[place], [place], [address] Phone: [phone]   Fax:  [phone]  Physical Therapy Treatment  Patient Details  Name: [name] [name] MRN: [id number] Date of Birth: 11-03-1960 Referring Provider: Harlow Mares   Encounter Date: 08/04/2015      PT End of Session - 08/04/15 1056    Visit Number 15   Number of Visits 17   Date for PT Re-Evaluation 08/14/15   PT Start Time 0937   PT Stop Time 1024   PT Time Calculation (min) 47 min   Activity Tolerance Patient tolerated treatment well   Behavior During Therapy [facility] for tasks assessed/performed      Past Medical History  Diagnosis Date  . Osteopenia   . Neurogenic bladder   . Wears contact lenses   . Family history of breast cancer   . Family history of colon cancer   . Breast cancer of upper-outer quadrant of right female breast ([place]) 10/11/2014  . Breast cancer ([place]) 2016    ER-/PR-/Her2+  . Migraines     takes metoprolol  for migraine headaches    Past Surgical History  Procedure Laterality Date  . Vein ligation      both legs  . Colonoscopy    . Upper gi endoscopy    . Portacath placement Right 10/29/2014    Procedure: INSERTION PORT-A-CATH WITH ULTRASOUND;  Surgeon: Erroll Luna, MD;  Location: [facility];  Service: General;  Laterality: Right;  . Breast surgery      rt, breast biopsy  . Nipple sparing mastectomy/sentinal lymph node biopsy/reconstruction/placement of tissue expander Bilateral 05/19/2015    Procedure: BILATERAL NIPPLE SPARING MASTECTOMY WITH LEFT AXILLARY SENTINAL LYMPH NODE BIOPSY;  Surgeon: Erroll Luna, MD;  Location: [facility];  Service: General;  Laterality: Bilateral;  . Breast reconstruction with placement of tissue expander and flex hd (acellular hydrated dermis) Bilateral 05/19/2015    Procedure: BILATERAL NIPPLE SPARING MASTECTOMY WITH LEFT AXILLARY SENTINAL LYMPH NODE BIOPSY;  Surgeon: Erroll Luna, MD;   Location: [facility];  Service: General;  Laterality: Bilateral;    There were no vitals filed for this visit.  Visit Diagnosis:  Shoulder stiffness, left  Shoulder stiffness, right  At risk for lymphedema  Carcinoma of upper-outer quadrant of left female breast [facility])      Subjective Assessment - 08/04/15 0939    Subjective Nothing new.  The one exerises that one of y'all taught me--I get the weirdest feeling when I do one of them--where I put the Theraband around the doorknob--I want to make sure I'm doing it right.   Currently in Pain? No/denies            [facility] PT Assessment - 08/04/15 0001    AROM   Right Shoulder Flexion 129 Degrees  Supine after stretching   Right Shoulder ABduction 152 Degrees  Slight scaption, done in standing after stretching   Left Shoulder Flexion 129 Degrees  Supine after stretching   Left Shoulder ABduction 155 Degrees  Slight scaption, done in standing after stretching                     [facility] Adult PT Treatment/Exercise - 08/04/15 0001    Shoulder Exercises: Standing   Internal Rotation 5 reps;Strengthening;Both;Theraband   Theraband Level (Shoulder Internal Rotation) Level 1 (Yellow)   Internal Rotation Limitations Reviewed this with pt per her request and instructed her that "feeling the pectoralis" is okay as long as she  isn't having pain.   Shoulder Exercises: Pulleys   Flexion 2 minutes   ABduction 2 minutes   Shoulder Exercises: Therapy Ball   Flexion 10 reps   Shoulder Exercises: ROM/Strengthening   Wall Pushups 10 reps   Pushups Limitations Had pt perform with elbows out and then with elbows tucked at sides focusing on scapular retraction   Other ROM/Strengthening Exercises Finger ladder bil abduction 5 reps each UE   Manual Therapy   Soft tissue mobilization At Lt axilla near pectoralis insertion during PROM   Myofascial Release left UE myofascial pulling   Passive ROM In supine to Lt shoulder for IR, ER,  abduction, flexion, and then arms up into radiation treatment positioning   Neural Stretch --                   Short Term Clinic Goals - 06/13/15 1307    CC Short Term Goal  #1   Title Short term goals = long term goals             Long Term Clinic Goals - 08/04/15 1104    CC Long Term Goal  #1   Title Patient will improve left shoulder flexion range of motion to 125 degrees so reach up in her kitchen cabinets   Bil shoulders were 129 degrees today after stretching 08/04/15   Status Achieved   CC Long Term Goal  #2   Title Patient will improve right shoulder flexion range of motion to 125 degrees to perform activities of daily living and household chores with greater ease  Bil 129 degrees today 08/04/15   Status Achieved   CC Long Term Goal  #3   Title Patient will improve left shoulder abduction to 120 degrees so that she can wash her hair with greater ease    Lt 155 degrees today 08/04/15   CC Long Term Goal  #4   Title Patient will improve right shoulder abduction to 120 degrees so that she can wash her hair with greater ease  Rt 152 degrees today 08/04/15   Status Achieved   CC Long Term Goal  #5   Title Patient will be independent in advanced home exercise program   Status Partially Met   CC Long Term Goal  #6   Title Pt will be independent with scar massage   Status On-going            Plan - 08/04/15 1057    Clinical Impression Statement Pt tolerated treatment very well today and wanted to compare her pre surgical and current AROM measurements of bil shoulders. She has improved well over the last few weeks also reporting less pain at end ROM now, mostly just tightnes. Her bil abduction is closer to presurgical than flexion, but this has also shown improvement from last week. She has met her ROM goals this week.   Pt will benefit from skilled therapeutic intervention in order to improve on the following deficits Decreased range of motion;Postural dysfunction    Rehab Potential Excellent   Clinical Impairments Affecting Rehab Potential continues with target therapy through IV every 3 weeks  Activity restriction with no massage.  Starts radiation with simulaiton the week of 07/28/15.   PT Frequency 2x / week   PT Duration 4 weeks   PT Treatment/Interventions Passive range of motion;Manual techniques   PT Next Visit Plan continue PROM and manual techniques; therapeutic exercise for ROM focusing a little more on Lt due to radiaiton.  Consulted and Agree with Plan of Care Patient        Problem List Patient Active Problem List   Diagnosis Date Noted  . Allergic dermatitis 06/19/2015  . Nausea without vomiting 12/30/2014  . Diarrhea 12/30/2014  . Dehydration 12/30/2014  . Hypotension 12/30/2014  . 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 External hemorrhoid 12/13/2014  . UTI (urinary tract infection) 11/16/2014  . Genetic testing 11/07/2014  . Migraine headache 11/06/2014  . Family history of breast cancer   . Family history of colon cancer   . Breast cancer of upper-outer quadrant of left female breast ([place]) 10/11/2014    Otelia Limes, PTA 08/04/2015, 11:11 AM  [place], [place], [address] Phone: [phone]   Fax:  [phone]  Name: [name] [name] MRN: [id number] Date of Birth: 1961/02/25

## 2015-08-05 ENCOUNTER — Ambulatory Visit
Admission: RE | Admit: 2015-08-05 | Discharge: 2015-08-05 | Disposition: A | Discharge status: Home / Self Care | Payer: BLUE CROSS/BLUE SHIELD | Source: Ambulatory Visit | Attending: Radiation Oncology | Admitting: Radiation Oncology

## 2015-08-05 ENCOUNTER — Telehealth: Payer: Self-pay | Admitting: Hematology and Oncology

## 2015-08-05 DIAGNOSIS — C50412 Malignant neoplasm of upper-outer quadrant of left female breast: Secondary | ICD-10-CM

## 2015-08-05 DIAGNOSIS — Z510 Encounter for antineoplastic radiation therapy: Secondary | ICD-10-CM | POA: Diagnosis not present

## 2015-08-05 NOTE — Progress Notes (Signed)
  Radiation Oncology         [phone] ________________________________  Name: [name] [name] MRN: [id number]  Date: 08/05/2015  DOB: 14-Feb-1961  Simulation Verification Note    ICD-9-CM ICD-10-CM   1. Breast cancer of upper-outer quadrant of left female breast ([place]) 174.4 C50.412     Status: outpatient  NARRATIVE: The patient was brought to the treatment unit and placed in the planned treatment position. The clinical setup was verified. Then port films were obtained and uploaded to the radiation oncology medical record software.  The treatment beams were carefully compared against the planned radiation fields. The position location and shape of the radiation fields was reviewed. They targeted volume of tissue appears to be appropriately covered by the radiation beams. Organs at risk appear to be excluded as planned.  Based on my personal review, I approved the simulation verification. The patient's treatment will proceed as planned.  -----------------------------------  Blair Promise, PhD, MD

## 2015-08-05 NOTE — Telephone Encounter (Signed)
Spoke with patient re echo/besimhon for 2/16 @ 11 am. No other orders per 2/2 pof.

## 2015-08-06 ENCOUNTER — Ambulatory Visit
Admission: RE | Admit: 2015-08-06 | Discharge: 2015-08-06 | Disposition: A | Discharge status: Home / Self Care | Payer: BLUE CROSS/BLUE SHIELD | Source: Ambulatory Visit | Attending: Radiation Oncology | Admitting: Radiation Oncology

## 2015-08-06 DIAGNOSIS — Z510 Encounter for antineoplastic radiation therapy: Secondary | ICD-10-CM | POA: Diagnosis not present

## 2015-08-07 ENCOUNTER — Ambulatory Visit
Admission: RE | Admit: 2015-08-07 | Discharge: 2015-08-07 | Disposition: A | Discharge status: Home / Self Care | Payer: BLUE CROSS/BLUE SHIELD | Source: Ambulatory Visit | Attending: Radiation Oncology | Admitting: Radiation Oncology

## 2015-08-07 ENCOUNTER — Ambulatory Visit: Payer: BLUE CROSS/BLUE SHIELD | Admitting: Physical Therapy

## 2015-08-07 DIAGNOSIS — M25612 Stiffness of left shoulder, not elsewhere classified: Secondary | ICD-10-CM | POA: Diagnosis not present

## 2015-08-07 DIAGNOSIS — Z510 Encounter for antineoplastic radiation therapy: Secondary | ICD-10-CM | POA: Diagnosis not present

## 2015-08-07 DIAGNOSIS — M25611 Stiffness of right shoulder, not elsewhere classified: Secondary | ICD-10-CM

## 2015-08-07 DIAGNOSIS — Z9189 Other specified personal risk factors, not elsewhere classified: Secondary | ICD-10-CM

## 2015-08-07 NOTE — Therapy (Signed)
[place], [place], [address] Phone: [phone]   Fax:  [phone]  Physical Therapy Treatment  Patient Details  Name: [name] [name] MRN: [id number] Date of Birth: Dec 28, 1960 Referring Provider: Harlow Mares   Encounter Date: 08/07/2015      PT End of Session - 08/07/15 1718    Visit Number 16   Number of Visits 17   Date for PT Re-Evaluation 08/14/15   PT Start Time 1430   PT Stop Time 1513   PT Time Calculation (min) 43 min   Activity Tolerance Patient tolerated treatment well   Behavior During Therapy [facility] for tasks assessed/performed      Past Medical History  Diagnosis Date  . Osteopenia   . Neurogenic bladder   . Wears contact lenses   . Family history of breast cancer   . Family history of colon cancer   . Breast cancer of upper-outer quadrant of right female breast ([place]) 10/11/2014  . Breast cancer ([place]) 2016    ER-/PR-/Her2+  . Migraines     takes metoprolol  for migraine headaches    Past Surgical History  Procedure Laterality Date  . Vein ligation      both legs  . Colonoscopy    . Upper gi endoscopy    . Portacath placement Right 10/29/2014    Procedure: INSERTION PORT-A-CATH WITH ULTRASOUND;  Surgeon: Erroll Luna, MD;  Location: [place];  Service: General;  Laterality: Right;  . Breast surgery      rt, breast biopsy  . Nipple sparing mastectomy/sentinal lymph node biopsy/reconstruction/placement of tissue expander Bilateral 05/19/2015    Procedure: BILATERAL NIPPLE SPARING MASTECTOMY WITH LEFT AXILLARY SENTINAL LYMPH NODE BIOPSY;  Surgeon: Erroll Luna, MD;  Location: [place];  Service: General;  Laterality: Bilateral;  . Breast reconstruction with placement of tissue expander and flex hd (acellular hydrated dermis) Bilateral 05/19/2015    Procedure: BILATERAL NIPPLE SPARING MASTECTOMY WITH LEFT AXILLARY SENTINAL LYMPH NODE BIOPSY;  Surgeon: Erroll Luna, MD;   Location: [place];  Service: General;  Laterality: Bilateral;    There were no vitals filed for this visit.  Visit Diagnosis:  Shoulder stiffness, left  Shoulder stiffness, right  At risk for lymphedema      Subjective Assessment - 08/07/15 1505    Subjective pt feel a little "fuzzy feeling " across front of chest on top of the implants.     Pertinent History Diagnosed 10/03/14 with left ER/PR negative, HER2 positive breast cancer.  She also has a positive left axillary node. Bilateral mastectomy with 2 nodes removed from the left side, none on the right.on Nov. 21 with immedicate silicone implants. Has had chemotherapy and will continue with target therapy till June and will see about radition therapy in  January.     Pain Score 2    Pain Location Chest                         [facility] Adult PT Treatment/Exercise - 08/07/15 0001    Shoulder Exercises: Pulleys   Flexion 2 minutes  verbal cues for technique   ABduction 2 minutes   Shoulder Exercises: ROM/Strengthening   Over Head Lace 3 to each direction    Ball on Wall yellow ball for shoulder flexion stretch x 5 reps    Shoulder Exercises: Stretch   Wall Stretch - Flexion 2 reps;10 seconds   Other Shoulder Stretches sidelying upper thoracic and arm  stretch to opposite side    Other Shoulder Stretches supine, "goal post arms' with forearms on pillow    Manual Therapy   Passive ROM In supine to Lt shoulder for IR, ER, abduction, flexion, and then arms up into radiation treatment positioning   Neural Stretch Passive neural stretch Lt UE in supine to pt tolerance                   Short Term Clinic Goals - 06/13/15 1307    CC Short Term Goal  #1   Title Short term goals = long term goals             Long Term Clinic Goals - 08/04/15 1104    CC Long Term Goal  #1   Title Patient will improve left shoulder flexion range of motion to 125 degrees so reach up in her kitchen cabinets   Bil shoulders  were 129 degrees today after stretching 08/04/15   Status Achieved   CC Long Term Goal  #2   Title Patient will improve right shoulder flexion range of motion to 125 degrees to perform activities of daily living and household chores with greater ease  Bil 129 degrees today 08/04/15   Status Achieved   CC Long Term Goal  #3   Title Patient will improve left shoulder abduction to 120 degrees so that she can wash her hair with greater ease    Lt 155 degrees today 08/04/15   CC Long Term Goal  #4   Title Patient will improve right shoulder abduction to 120 degrees so that she can wash her hair with greater ease  Rt 152 degrees today 08/04/15   Status Achieved   CC Long Term Goal  #5   Title Patient will be independent in advanced home exercise program   Status Partially Met   CC Long Term Goal  #6   Title Pt will be independent with scar massage   Status On-going            Plan - 08/07/15 1718    Clinical Impression Statement Pt continues to improve.  She has begun radiation treatments and reinforced today stretches that she should continue during treatment and to use the creams as recommended by Dr. Sondra Come.  She has one more authorized visit.    Clinical Impairments Affecting Rehab Potential continues with target therapy through IV every 3 weeks  Activity restriction with no massage.  Starts radiation with simulaiton the week of 07/28/15.   PT Next Visit Plan Decide for recert if to continue with PT now or suspend treatment until after radiation is complete.   Consulted and Agree with Plan of Care Patient        Problem List Patient Active Problem List   Diagnosis Date Noted  . Allergic dermatitis 06/19/2015  . Nausea without vomiting 12/30/2014  . Diarrhea 12/30/2014  . Dehydration 12/30/2014  . Hypotension 12/30/2014  . 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 External hemorrhoid 12/13/2014  . UTI (urinary tract infection) 11/16/2014  . Genetic testing 11/07/2014   . Migraine headache 11/06/2014  . Family history of breast cancer   . Family history of colon cancer   . Breast cancer of upper-outer quadrant of left female breast ([place]) 10/11/2014   Donato Heinz. Owens Shark, PT  08/07/2015, 5:23 PM  [place], [place], [address] Phone: [phone]   Fax:  [phone]  Name: NOEMI BELLISSIMO MRN: [id number]  Date of Birth: [birth year]

## 2015-08-08 ENCOUNTER — Ambulatory Visit
Admission: RE | Admit: 2015-08-08 | Discharge: 2015-08-08 | Disposition: A | Discharge status: Home / Self Care | Payer: BLUE CROSS/BLUE SHIELD | Source: Ambulatory Visit | Attending: Radiation Oncology | Admitting: Radiation Oncology

## 2015-08-08 DIAGNOSIS — Z510 Encounter for antineoplastic radiation therapy: Secondary | ICD-10-CM | POA: Diagnosis not present

## 2015-08-11 ENCOUNTER — Ambulatory Visit
Admission: RE | Admit: 2015-08-11 | Discharge: 2015-08-11 | Disposition: A | Discharge status: Home / Self Care | Payer: BLUE CROSS/BLUE SHIELD | Source: Ambulatory Visit | Attending: Radiation Oncology | Admitting: Radiation Oncology

## 2015-08-11 DIAGNOSIS — Z510 Encounter for antineoplastic radiation therapy: Secondary | ICD-10-CM | POA: Diagnosis not present

## 2015-08-11 DIAGNOSIS — C50412 Malignant neoplasm of upper-outer quadrant of left female breast: Secondary | ICD-10-CM

## 2015-08-11 MED ORDER — RADIAPLEXRX EX GEL
Freq: Once | CUTANEOUS | Status: AC
  Administered 2015-08-11: 16:00:00 via TOPICAL

## 2015-08-11 MED ORDER — ALRA NON-METALLIC DEODORANT (RAD-ONC)
1.0000 "application " | Freq: Once | TOPICAL | Status: AC
  Administered 2015-08-11: 1 via TOPICAL

## 2015-08-11 NOTE — Progress Notes (Signed)
Pt here for patient teaching.  Pt given Radiation and You booklet, skin care instructions, Alra deodorant and Radiaplex gel. Pt reports they have not watched the Radiation Therapy Education video and has been given the link to watch the video at home.  Reviewed areas of pertinence such as fatigue and skin changes . Pt able to give teach back of to pat skin and use unscented/gentle soap,apply Radiaplex bid and avoid applying anything to skin within 4 hours of treatment. Pt demonstrated understanding and verbalizes understanding of information given and will contact nursing with any questions or concerns.     Http://[email or web address]/[email or web address]

## 2015-08-12 ENCOUNTER — Ambulatory Visit
Admission: RE | Admit: 2015-08-12 | Discharge: 2015-08-12 | Disposition: A | Discharge status: Home / Self Care | Payer: BLUE CROSS/BLUE SHIELD | Source: Ambulatory Visit | Attending: Radiation Oncology | Admitting: Radiation Oncology

## 2015-08-12 ENCOUNTER — Encounter: Payer: Self-pay | Admitting: Radiation Oncology

## 2015-08-12 VITALS — BP 100/57 | HR 67 | Temp 97.7°F | Ht 60.5 in | Wt 110.4 lb

## 2015-08-12 DIAGNOSIS — C50412 Malignant neoplasm of upper-outer quadrant of left female breast: Secondary | ICD-10-CM

## 2015-08-12 DIAGNOSIS — Z510 Encounter for antineoplastic radiation therapy: Secondary | ICD-10-CM | POA: Diagnosis not present

## 2015-08-12 NOTE — Progress Notes (Signed)
  Radiation Oncology         [phone] ________________________________  Name: [name] [name] MRN: [id number]  Date: 08/12/2015  DOB: 1961/05/15  Weekly Radiation Therapy Management    ICD-9-CM ICD-10-CM   1. Breast cancer of upper-outer quadrant of left female breast ([facility]) 174.4 C50.412      Current Dose: 9 Gy     Planned Dose:  45+ Gy  Narrative . . . . . . . . The patient presents for routine under treatment assessment.                                   [name] [name] has completed 5 fractions to her left chest wall. She denies pain and fatigue. The skin on her left chest is intact. She does report feeling a burning feeling off and on in both breasts. She said she had this after surgery and it had gone away.                                  Set-up films were reviewed.                                 The chart was checked. Physical Findings. . .  height is 5' 0.5" (1.537 m) and weight is 110 lb 6.4 oz (50.077 kg). Her oral temperature is 97.7 F (36.5 C). Her blood pressure is 100/57 and her pulse is 67. . Weight essentially stable.  No significant changes. The lungs are clear. The heart has a regular rhythm and rate. No appreciable radiation reaction noted along the left chest region. Impression . . . . . . . The patient is tolerating radiation. Plan . . . . . . . . . . . . Continue treatment as planned.  ________________________________   Blair Promise, PhD, MD

## 2015-08-12 NOTE — Progress Notes (Signed)
[name] [name] has completed 5 fractions to her left chest wall.  She denies pain and fatigue.  The skin on her left chest is intact.  She does report feeling a burning feeling off and on in both breasts.  She said she had this after surgery and it had gone away.    BP 100/57 mmHg  Pulse 67  Temp(Src) 97.7 F (36.5 C) (Oral)  Ht 5' 0.5" (1.537 m)  Wt 110 lb 6.4 oz (50.077 kg)  BMI 21.20 kg/m2

## 2015-08-13 ENCOUNTER — Ambulatory Visit
Admission: RE | Admit: 2015-08-13 | Discharge: 2015-08-13 | Disposition: A | Discharge status: Home / Self Care | Payer: BLUE CROSS/BLUE SHIELD | Source: Ambulatory Visit | Attending: Radiation Oncology | Admitting: Radiation Oncology

## 2015-08-13 DIAGNOSIS — Z510 Encounter for antineoplastic radiation therapy: Secondary | ICD-10-CM | POA: Diagnosis not present

## 2015-08-14 ENCOUNTER — Telehealth: Payer: Self-pay | Admitting: *Deleted

## 2015-08-14 ENCOUNTER — Ambulatory Visit (HOSPITAL_COMMUNITY): Payer: BLUE CROSS/BLUE SHIELD

## 2015-08-14 ENCOUNTER — Encounter (HOSPITAL_COMMUNITY): Payer: BLUE CROSS/BLUE SHIELD | Admitting: Internal Medicine

## 2015-08-14 ENCOUNTER — Ambulatory Visit
Admission: RE | Admit: 2015-08-14 | Discharge: 2015-08-14 | Disposition: A | Discharge status: Home / Self Care | Payer: BLUE CROSS/BLUE SHIELD | Source: Ambulatory Visit | Attending: Radiation Oncology | Admitting: Radiation Oncology

## 2015-08-14 DIAGNOSIS — Z510 Encounter for antineoplastic radiation therapy: Secondary | ICD-10-CM | POA: Diagnosis not present

## 2015-08-14 NOTE — Telephone Encounter (Signed)
Spoke with patient to follow up after starting radiation.  She states she is doing ok.  Encouraged her to call with any needs or concerns.

## 2015-08-15 ENCOUNTER — Ambulatory Visit
Admission: RE | Admit: 2015-08-15 | Discharge: 2015-08-15 | Disposition: A | Discharge status: Home / Self Care | Payer: BLUE CROSS/BLUE SHIELD | Source: Ambulatory Visit | Attending: Radiation Oncology | Admitting: Radiation Oncology

## 2015-08-15 DIAGNOSIS — Z510 Encounter for antineoplastic radiation therapy: Secondary | ICD-10-CM | POA: Diagnosis not present

## 2015-08-18 ENCOUNTER — Ambulatory Visit
Admission: RE | Admit: 2015-08-18 | Discharge: 2015-08-18 | Disposition: A | Discharge status: Home / Self Care | Payer: BLUE CROSS/BLUE SHIELD | Source: Ambulatory Visit | Attending: Radiation Oncology | Admitting: Radiation Oncology

## 2015-08-18 DIAGNOSIS — Z510 Encounter for antineoplastic radiation therapy: Secondary | ICD-10-CM | POA: Diagnosis not present

## 2015-08-19 ENCOUNTER — Ambulatory Visit
Admission: RE | Admit: 2015-08-19 | Discharge: 2015-08-19 | Disposition: A | Discharge status: Home / Self Care | Payer: BLUE CROSS/BLUE SHIELD | Source: Ambulatory Visit | Attending: Radiation Oncology | Admitting: Radiation Oncology

## 2015-08-19 ENCOUNTER — Encounter: Payer: Self-pay | Admitting: Radiation Oncology

## 2015-08-19 ENCOUNTER — Ambulatory Visit: Payer: BLUE CROSS/BLUE SHIELD | Admitting: Physical Therapy

## 2015-08-19 VITALS — BP 85/52 | HR 56 | Temp 97.9°F | Resp 16 | Ht 60.5 in | Wt 111.2 lb

## 2015-08-19 DIAGNOSIS — M25612 Stiffness of left shoulder, not elsewhere classified: Secondary | ICD-10-CM

## 2015-08-19 DIAGNOSIS — Z510 Encounter for antineoplastic radiation therapy: Secondary | ICD-10-CM | POA: Diagnosis not present

## 2015-08-19 DIAGNOSIS — M25611 Stiffness of right shoulder, not elsewhere classified: Secondary | ICD-10-CM

## 2015-08-19 DIAGNOSIS — Z9189 Other specified personal risk factors, not elsewhere classified: Secondary | ICD-10-CM

## 2015-08-19 DIAGNOSIS — C50412 Malignant neoplasm of upper-outer quadrant of left female breast: Secondary | ICD-10-CM

## 2015-08-19 NOTE — Therapy (Signed)
[place], [place], [address] Phone: [phone]   Fax:  [phone]  Physical Therapy Treatment  Patient Details  Name: [name] [name] MRN: [id number] Date of Birth: 1961-03-14 Referring Provider: Harlow Mares   Encounter Date: 08/19/2015      PT End of Session - 08/19/15 1636    Visit Number 17   Number of Visits 25   Date for PT Re-Evaluation 10/17/15   PT Start Time 1430   PT Stop Time 1515   PT Time Calculation (min) 45 min   Activity Tolerance Patient tolerated treatment well   Behavior During Therapy [facility] for tasks assessed/performed      Past Medical History  Diagnosis Date  . Osteopenia   . Neurogenic bladder   . Wears contact lenses   . Family history of breast cancer   . Family history of colon cancer   . Breast cancer of upper-outer quadrant of right female breast ([place]) 10/11/2014  . Breast cancer ([place]) 2016    ER-/PR-/Her2+  . Migraines     takes metoprolol  for migraine headaches    Past Surgical History  Procedure Laterality Date  . Vein ligation      both legs  . Colonoscopy    . Upper gi endoscopy    . Portacath placement Right 10/29/2014    Procedure: INSERTION PORT-A-CATH WITH ULTRASOUND;  Surgeon: Erroll Luna, MD;  Location: [place];  Service: General;  Laterality: Right;  . Breast surgery      rt, breast biopsy  . Nipple sparing mastectomy/sentinal lymph node biopsy/reconstruction/placement of tissue expander Bilateral 05/19/2015    Procedure: BILATERAL NIPPLE SPARING MASTECTOMY WITH LEFT AXILLARY SENTINAL LYMPH NODE BIOPSY;  Surgeon: Erroll Luna, MD;  Location: [place];  Service: General;  Laterality: Bilateral;  . Breast reconstruction with placement of tissue expander and flex hd (acellular hydrated dermis) Bilateral 05/19/2015    Procedure: BILATERAL NIPPLE SPARING MASTECTOMY WITH LEFT AXILLARY SENTINAL LYMPH NODE BIOPSY;  Surgeon: Erroll Luna,  MD;  Location: [place];  Service: General;  Laterality: Bilateral;    There were no vitals filed for this visit.  Visit Diagnosis:  Shoulder stiffness, left - Plan: PT plan of care cert/re-cert  Shoulder stiffness, right - Plan: PT plan of care cert/re-cert  At risk for lymphedema - Plan: PT plan of care cert/re-cert      Subjective Assessment - 08/19/15 1631    Subjective "have had 10 or 11 treatments" she has started a walk/jog program    Pertinent History Diagnosed 10/03/14 with left ER/PR negative, HER2 positive breast cancer.  She also has a positive left axillary node. Bilateral mastectomy with 2 nodes removed from the left side, none on the right.on Nov. 21 with immedicate silicone implants. Has had chemotherapy and will continue with target therapy till June and will see about radition therapy in  January.     Patient Stated Goals pt wants to get back on the tennis court  by May    Currently in Pain? No/denies            [facility] PT Assessment - 08/19/15 0001    AROM   Right Shoulder Flexion 145 Degrees   Right Shoulder ABduction 152 Degrees   Left Shoulder Flexion 140 Degrees   Left Shoulder ABduction 125 Degrees                     [facility] Adult PT Treatment/Exercise - 08/19/15 0001  Lumbar Exercises: Standing   Other Standing Lumbar Exercises knee raises x 10 with each leg   cues for core stability    Other Standing Lumbar Exercises hip abduction 10 reps with each leg    Lumbar Exercises: Sidelying   Hip Abduction 10 reps;Other (comment)   Hip Abduction Limitations cues to keep core still    Other Sidelying Lumbar Exercises arm stretches overhead    Shoulder Exercises: Seated   Elevation Strengthening;Both;10 reps;Theraband   Theraband Level (Shoulder Elevation) Level 2 (Red)   Horizontal ABduction Strengthening;Both;10 reps;Theraband   Theraband Level (Shoulder Horizontal ABduction) Level 2 (Red)   External Rotation Strengthening;Both;10 reps    Theraband Level (Shoulder External Rotation) Level 2 (Red)   Flexion Strengthening;Both;5 reps;Theraband;Other (comment)   Theraband Level (Shoulder Flexion) Level 2 (Red)   Flexion Weight (lbs) narrow and wide grip with cues to keep core stabalized and keep shoulders down away from ears    Shoulder Exercises: ROM/Strengthening   Over Head Lace 3 to each direction                 PT Education - 08/19/15 1636    Education Details progressive exercise for strengthening    Person(s) Educated Patient   Methods Explanation;Demonstration;Handout   Comprehension Verbalized understanding;Returned demonstration           Short Term Clinic Goals - 06/13/15 1307    CC Short Term Goal  #1   Title Short term goals = long term goals             Long Term Clinic Goals - 08/19/15 1643    CC Long Term Goal  #1   Title Patient will improve left shoulder flexion range of motion to 125 degrees so reach up in her kitchen cabinets    Status Achieved   CC Long Term Goal  #2   Title Patient will improve right shoulder flexion range of motion to 125 degrees to perform activities of daily living and household chores with greater ease   Status Achieved   CC Long Term Goal  #3   Title Patient will improve left shoulder abduction to 120 degrees so that she can wash her hair with greater ease     Status On-going   CC Long Term Goal  #4   Title Patient will improve right shoulder abduction to 120 degrees so that she can wash her hair with greater ease   Status On-going   CC Long Term Goal  #5   Title Patient will be independent in advanced home exercise program   Status On-going   CC Long Term Goal  #6   Title Pt will be independent with scar massage   Status On-going            Plan - 08/19/15 1638    Clinical Impression Statement Pt has had 2 weeks of radiation treatments. She continues to have decreased shoulder range of motion and feels she has not been making progress toward  her goal of returning to tennis.  Exercise program was upgraded today to include strengthening and pay attention to core stability that she will need for tennis., She will be cautious with exercise as she moves continues with radiation.  She will come back one week after radiation to assess ROM and strength and progres from there.    Pt will benefit from skilled therapeutic intervention in order to improve on the following deficits Decreased range of motion;Postural dysfunction   Rehab Potential Excellent  Clinical Impairments Affecting Rehab Potential continues with target therapy through IV every 3 weeks  Activity restriction with no massage.  Undergoing radiation treatment .   PT Frequency 2x / week  will not continue until after radiation    PT Duration 8 weeks   PT Next Visit Plan Reassess and progress exercise program after radiation is complete    Consulted and Agree with Plan of Care Patient        Problem List Patient Active Problem List   Diagnosis Date Noted  . Allergic dermatitis 06/19/2015  . Nausea without vomiting 12/30/2014  . Diarrhea 12/30/2014  . Dehydration 12/30/2014  . Hypotension 12/30/2014  . 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 External hemorrhoid 12/13/2014  . UTI (urinary tract infection) 11/16/2014  . Genetic testing 11/07/2014  . Migraine headache 11/06/2014  . Family history of breast cancer   . Family history of colon cancer   . Breast cancer of upper-outer quadrant of left female breast ([place]) 10/11/2014   Donato Heinz. Owens Shark, PT 08/19/2015, 4:49 PM  [place], [place], [address] Phone: [phone]   Fax:  [phone]  Name: [name] [name] MRN: [id number] Date of Birth: 1960-09-26

## 2015-08-19 NOTE — Progress Notes (Signed)
  Radiation Oncology         [phone] ________________________________  Name: [name] [name] MRN: [id number]  Date: 08/19/2015  DOB: 12/17/1960  Weekly Radiation Therapy Management    ICD-9-CM ICD-10-CM   1. Breast cancer of upper-outer quadrant of left female breast ([facility]) 174.4 C50.412      Current Dose: 18 Gy     Planned Dose:  50.4 Gy  Narrative . . . . . . . . The patient presents for routine under treatment assessment. Mischelle Stockstill has completed 10 fractions to her left chest wall. She denies having pain and fatigue. She does report having some burning in her left chest. She is using radiaplex. She reports pruritus in the right back area.                                 Set-up films were reviewed.                                 The chart was checked. Physical Findings. . .  height is 5' 0.5" (1.537 m) and weight is 111 lb 3.2 oz (50.44 kg). Her oral temperature is 97.9 F (36.6 C). Her blood pressure is 85/52 and her pulse is 56. Her respiration is 16. . Weight essentially stable.  No significant changes. The lungs are clear. The heart has a regular rhythm and rate. Minimal erythema in the left chest wall/reconstructed area. Impression . . . . . . . The patient is tolerating radiation. Plan . . . . . . . . . . . . Continue treatment as planned. I advised the patient that she could use hydrocortisone cream for the itching. ________________________________   Blair Promise, PhD, MD  This document serves as a record of services personally performed by Gery Pray, MD. It was created on his behalf by Darcus Austin, a trained medical scribe. The creation of this record is based on the scribe's personal observations and the provider's statements to them. This document has been checked and approved by the attending provider.

## 2015-08-19 NOTE — Patient Instructions (Addendum)
Sidelying.  Raise top leg up 10 times....KEEP body straight.   Then stretch arm overhead 10 times do on each side    Standing: 1. Knee raises.Marland KitchenKEEP body tall and keep eveything still                   2.Body stll and raise leg out to side  Try to balance                  3.Hands together.  Bring hands to one hip, then out to front and to other hip. Keep your core, pelvis and legs still BELLY BUTTON TO THE SPINE   Do all the exercises you were doing on your back  Now do them in sitting, remember to sit tall with core core engaged     Keep working on your core through radiation.  Keep exercising.   Do not massage chest tightness or stretch if its irritated from radiation

## 2015-08-19 NOTE — Progress Notes (Signed)
[name] [name] has completed 10 fractions to her left chest wall.  She denies having pain and fatigue.  She does report having some burning in her left chest.  The skin on her left chest has slight hyperpigmentation.  She is using radiaplex.  BP 85/52 mmHg  Pulse 56  Temp(Src) 97.9 F (36.6 C) (Oral)  Resp 16  Ht 5' 0.5" (1.537 m)  Wt 111 lb 3.2 oz (50.44 kg)  BMI 21.35 kg/m2

## 2015-08-20 ENCOUNTER — Ambulatory Visit
Admission: RE | Admit: 2015-08-20 | Discharge: 2015-08-20 | Disposition: A | Discharge status: Home / Self Care | Payer: BLUE CROSS/BLUE SHIELD | Source: Ambulatory Visit | Attending: Radiation Oncology | Admitting: Radiation Oncology

## 2015-08-20 DIAGNOSIS — Z510 Encounter for antineoplastic radiation therapy: Secondary | ICD-10-CM | POA: Diagnosis not present

## 2015-08-20 NOTE — Assessment & Plan Note (Signed)
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:00 position, left axillary lymph node palpable, ER/PR negative, 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 5.32, T1 cN1 M0 stage II a clinical stage Treatment summary: Neo adjuvant chemotherapy with [facility] for general every 3 weeks 6 cycles started 11/01/2014 completed 02/14/2015 Post-neo-adjuvant breast MRI 02/17/2015: No residual mass or abnormal enhancement, no enlarged lymph nodes Post-neo-adjuvant CT chest 02/18/2015: Interval resolution of the left breast mass and multiple enlarged left axillary and subpectoral lymph nodes Bilateral mastectomies 05/19/2015 Left: Radial scar, FA, UDH no malignancy 0/1 lymph node negative; right: FA, radial scar, UDH no malignancy (pathologic complete response) [id number] ---------------------------------------------------------------------------------------------------------------------------------------------------- Current treatment: Herceptin maintenance to complete April 2017 Patient will start adjuvant radiation therapy probably in the next 2 weeks  Chemotherapy-induced anemia: monitoring Rash underneath the breasts bilaterally extending into the axilla: his most certainly contact dermatitis. I will prescribe her Medrol Dosepak. I instructed her to call us next week. If it does not get better then we might have to treat her with acyclovir.  Return to clinic in 6 weeks for follow-up and in 3 weeks for Herceptin.

## 2015-08-21 ENCOUNTER — Ambulatory Visit (HOSPITAL_BASED_OUTPATIENT_CLINIC_OR_DEPARTMENT_OTHER): Payer: BLUE CROSS/BLUE SHIELD | Admitting: Hematology and Oncology

## 2015-08-21 ENCOUNTER — Ambulatory Visit (HOSPITAL_BASED_OUTPATIENT_CLINIC_OR_DEPARTMENT_OTHER): Payer: BLUE CROSS/BLUE SHIELD

## 2015-08-21 ENCOUNTER — Ambulatory Visit
Admission: RE | Admit: 2015-08-21 | Discharge: 2015-08-21 | Disposition: A | Discharge status: Home / Self Care | Payer: BLUE CROSS/BLUE SHIELD | Source: Ambulatory Visit | Attending: Radiation Oncology | Admitting: Radiation Oncology

## 2015-08-21 ENCOUNTER — Encounter: Payer: Self-pay | Admitting: Hematology and Oncology

## 2015-08-21 ENCOUNTER — Other Ambulatory Visit (HOSPITAL_BASED_OUTPATIENT_CLINIC_OR_DEPARTMENT_OTHER): Payer: BLUE CROSS/BLUE SHIELD

## 2015-08-21 ENCOUNTER — Telehealth: Payer: Self-pay | Admitting: Hematology and Oncology

## 2015-08-21 VITALS — BP 97/57 | HR 68 | Temp 97.9°F | Resp 17 | Wt 111.7 lb

## 2015-08-21 DIAGNOSIS — C50412 Malignant neoplasm of upper-outer quadrant of left female breast: Secondary | ICD-10-CM | POA: Diagnosis not present

## 2015-08-21 DIAGNOSIS — Z171 Estrogen receptor negative status [ER-]: Secondary | ICD-10-CM

## 2015-08-21 DIAGNOSIS — T451X5A Adverse effect of antineoplastic and immunosuppressive drugs, initial encounter: Principal | ICD-10-CM

## 2015-08-21 DIAGNOSIS — Z510 Encounter for antineoplastic radiation therapy: Secondary | ICD-10-CM | POA: Diagnosis not present

## 2015-08-21 DIAGNOSIS — D6481 Anemia due to antineoplastic chemotherapy: Secondary | ICD-10-CM

## 2015-08-21 DIAGNOSIS — C773 Secondary and unspecified malignant neoplasm of axilla and upper limb lymph nodes: Secondary | ICD-10-CM

## 2015-08-21 DIAGNOSIS — Z5112 Encounter for antineoplastic immunotherapy: Secondary | ICD-10-CM | POA: Diagnosis not present

## 2015-08-21 LAB — CBC WITH DIFFERENTIAL/PLATELET
BASO%: 1.1 % (ref 0.0–2.0)
BASOS ABS: 0 10*3/uL (ref 0.0–0.1)
EOS ABS: 0.1 10*3/uL (ref 0.0–0.5)
EOS%: 1.5 % (ref 0.0–7.0)
HEMATOCRIT: 34.2 % — AB (ref 34.8–46.6)
HEMOGLOBIN: 11.1 g/dL — AB (ref 11.6–15.9)
LYMPH#: 1 10*3/uL (ref 0.9–3.3)
LYMPH%: 24.8 % (ref 14.0–49.7)
MCH: 27.4 pg (ref 25.1–34.0)
MCHC: 32.4 g/dL (ref 31.5–36.0)
MCV: 84.4 fL (ref 79.5–101.0)
MONO#: 0.4 10*3/uL (ref 0.1–0.9)
MONO%: 8.9 % (ref 0.0–14.0)
NEUT#: 2.5 10*3/uL (ref 1.5–6.5)
NEUT%: 63.7 % (ref 38.4–76.8)
Platelets: 185 10*3/uL (ref 145–400)
RBC: 4.05 10*6/uL (ref 3.70–5.45)
RDW: 16.4 % — AB (ref 11.2–14.5)
WBC: 3.9 10*3/uL (ref 3.9–10.3)

## 2015-08-21 LAB — COMPREHENSIVE METABOLIC PANEL
ALBUMIN: 3.8 g/dL (ref 3.5–5.0)
ALK PHOS: 111 U/L (ref 40–150)
ALT: 35 U/L (ref 0–55)
AST: 30 U/L (ref 5–34)
Anion Gap: 9 mEq/L (ref 3–11)
BUN: 18.1 mg/dL (ref 7.0–26.0)
CALCIUM: 9.2 mg/dL (ref 8.4–10.4)
CO2: 27 mEq/L (ref 22–29)
Chloride: 106 mEq/L (ref 98–109)
Creatinine: 0.7 mg/dL (ref 0.6–1.1)
Glucose: 70 mg/dl (ref 70–140)
POTASSIUM: 4.6 meq/L (ref 3.5–5.1)
Sodium: 142 mEq/L (ref 136–145)
TOTAL PROTEIN: 6.8 g/dL (ref 6.4–8.3)

## 2015-08-21 MED ORDER — HEPARIN SOD (PORK) LOCK FLUSH 100 UNIT/ML IV SOLN
500.0000 [IU] | Freq: Once | INTRAVENOUS | Status: AC | PRN
  Administered 2015-08-21: 500 [IU]
  Filled 2015-08-21: qty 5

## 2015-08-21 MED ORDER — DIPHENHYDRAMINE HCL 25 MG PO CAPS: 50.0000 mg | ORAL_CAPSULE | Freq: Once | ORAL | Status: DC

## 2015-08-21 MED ORDER — ACETAMINOPHEN 325 MG PO TABS: 650.0000 mg | ORAL_TABLET | Freq: Once | ORAL | Status: DC

## 2015-08-21 MED ORDER — SODIUM CHLORIDE 0.9 % IV SOLN
Freq: Once | INTRAVENOUS | Status: AC
  Administered 2015-08-21: 11:00:00 via INTRAVENOUS

## 2015-08-21 MED ORDER — ACETAMINOPHEN 325 MG PO TABS
ORAL_TABLET | ORAL | Status: AC
  Filled 2015-08-21: qty 2

## 2015-08-21 MED ORDER — SODIUM CHLORIDE 0.9 % IJ SOLN
10.0000 mL | INTRAMUSCULAR | Status: DC | PRN
  Administered 2015-08-21: 10 mL
  Filled 2015-08-21: qty 10

## 2015-08-21 MED ORDER — TRASTUZUMAB CHEMO INJECTION 440 MG
6.0000 mg/kg | Freq: Once | INTRAVENOUS | Status: AC
  Administered 2015-08-21: 294 mg via INTRAVENOUS
  Filled 2015-08-21: qty 14

## 2015-08-21 MED ORDER — DIPHENHYDRAMINE HCL 25 MG PO CAPS
ORAL_CAPSULE | ORAL | Status: AC
  Filled 2015-08-21: qty 1

## 2015-08-21 NOTE — Telephone Encounter (Signed)
appt made and avs printed °

## 2015-08-21 NOTE — Progress Notes (Signed)
Unable to get in to exam room prior to MD.  No assessment performed.  

## 2015-08-21 NOTE — Progress Notes (Signed)
Patient Care Team: Laray Anger, MD as PCP - General (Obstetrics and Gynecology) Erroll Luna, MD as Consulting Physician (General Surgery) Nicholas Lose, MD as Consulting Physician (Hematology and Oncology) Gery Pray, MD as Consulting Physician (Radiation Oncology) Mauro Kaufmann, RN as Registered Nurse Rockwell Germany, RN as Registered Nurse  DIAGNOSIS: Breast cancer of upper-outer quadrant of left female breast [facility])   Staging form: Breast, AJCC 7th Edition     Clinical stage from 10/16/2014: Stage IIA (T1c, N1, M0) - Unsigned     Pathologic stage from 05/21/2015: yT0, N0, cM0 - Unsigned       Staging comments: Staged on surgical specimen by Dr. Lyndon Code    SUMMARY OF ONCOLOGIC HISTORY:   Breast cancer of upper-outer quadrant of left female breast ([place])   10/01/2014 Mammogram Left breast mammogram and ultrasound for palpable left breast mass which was a maxillary mass with an ill-defined abnormality by ultrasound 1.7 cm at 2:00   10/11/2014 Initial Diagnosis Left breast invasive ductal carcinoma, grade 3, ER/PR negative, 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 5.32, axillary lymph node also positive for cancer   10/30/2014 Imaging CT chest abdomen pelvis and bone scan revealed no evidence of distant metastatic disease but enlarged left axillary and left subpectoral lymph nodes   11/01/2014 -  Neo-Adjuvant Chemotherapy Neoadjuvant [facility] to 3 weeks 6 cycles followed by Herceptin maintenance   02/17/2015 Breast MRI No residual mass or abnormal enhancement, no enlarged lymph nodes   05/19/2015 Surgery Bilateral mastectomies with immediate reconstruction Left: Radial scar, FA, UDH no malignancy 0/1 lymph node negative; right: FA, radial scar, UV H no malignancy [id number]    CHIEF COMPLIANT:   INTERVAL HISTORY: [name] [name] is a     REVIEW OF SYSTEMS:   Constitutional: Denies fevers, chills or abnormal weight loss Eyes: Denies blurriness of vision Ears, nose, mouth, throat, and face: Denies  mucositis or sore throat Respiratory: Denies cough, dyspnea or wheezes Cardiovascular: Denies palpitation, chest discomfort Gastrointestinal:  Denies nausea, heartburn or change in bowel habits Skin: Denies abnormal skin rashes Lymphatics: Denies new lymphadenopathy or easy bruising Neurological:Denies numbness, tingling or new weaknesses Behavioral/Psych: Mood is stable, no new changes  Extremities: No lower extremity edema Breast:  denies any pain or lumps or nodules in either breasts All other systems were reviewed with the patient and are negative.  I have reviewed the past medical history, past surgical history, social history and family history with the patient and they are unchanged from previous note.  ALLERGIES:  is allergic to meperidine; stadol; zofran; and sulfa antibiotics.  MEDICATIONS:  Current Outpatient Prescriptions  Medication Sig Dispense Refill  . acetaminophen (TYLENOL) 325 MG tablet Take 650 mg by mouth every 6 (six) hours as needed for mild pain.     Marland Kitchen atenolol (TENORMIN) 25 MG tablet Take 75 mg by mouth daily. For migraines not blood pressure    . betamethasone valerate (VALISONE) 0.1 % cream Apply topically. Reported on 08/12/2015    . Calcium Carbonate (CALCIUM 600 PO) Take 1 tablet by mouth daily.     . cholecalciferol (VITAMIN D) 1000 UNITS tablet Take 1,000 Units by mouth daily.    . Diclofenac Potassium 50 MG PACK Take 50 mg by mouth.    . docusate sodium (COLACE) 100 MG capsule Take 100 mg by mouth 2 (two) times daily as needed for mild constipation.    Marland Kitchen eletriptan (RELPAX) 40 MG tablet Take 1 tablet (40 mg total) by mouth every 2 (two) hours as  needed for migraine or headache (Max 2 per day). 10 tablet 3  . hyaluronate sodium (RADIAPLEXRX) GEL Apply 1 application topically 2 (two) times daily.    . hydrocortisone 2.5 % ointment Apply topically 2 (two) times daily. (Patient not taking: Reported on 08/12/2015) 30 g 0  . Lactobacillus (ACIDOPHILUS PO) Take 1  capsule by mouth daily.     Marland Kitchen lidocaine-prilocaine (EMLA) cream Apply to affected area once (Patient taking differently: Apply 1 application topically as needed. Apply to affected area once) 30 g 3  . non-metallic deodorant (ALRA) MISC Apply 1 application topically daily as needed.    Marland Kitchen PARoxetine (PAXIL) 20 MG tablet Take 20 mg by mouth.    . promethazine (PHENERGAN) 12.5 MG tablet Take 1 tablet (12.5 mg total) by mouth every 6 (six) hours as needed for nausea or vomiting. (Patient not taking: Reported on 08/12/2015) 90 tablet 3   No current facility-administered medications for this visit.    PHYSICAL EXAMINATION: ECOG PERFORMANCE STATUS: 0 - Asymptomatic  Filed Vitals:   08/21/15 0921  BP: 97/57  Pulse: 68  Temp: 97.9 F (36.6 C)  Resp: 17   Filed Weights   08/21/15 0921  Weight: 111 lb 11.2 oz (50.667 kg)    GENERAL:alert, no distress and comfortable SKIN: skin color, texture, turgor are normal, no rashes or significant lesions EYES: normal, Conjunctiva are pink and non-injected, sclera clear OROPHARYNX:no exudate, no erythema and lips, buccal mucosa, and tongue normal  NECK: supple, thyroid normal size, non-tender, without nodularity LYMPH:  no palpable lymphadenopathy in the cervical, axillary or inguinal LUNGS: clear to auscultation and percussion with normal breathing effort HEART: regular rate & rhythm and no murmurs and no lower extremity edema ABDOMEN:abdomen soft, non-tender and normal bowel sounds MUSCULOSKELETAL:no cyanosis of digits and no clubbing  NEURO: alert & oriented x 3 with fluent speech, no focal motor/sensory deficits EXTREMITIES: No lower extremity edema  LABORATORY DATA:  I have reviewed the data as listed   Chemistry      Component Value Date/Time   NA 140 07/10/2015 0901   NA 130* 05/20/2015 0300   K 4.6 07/10/2015 0901   K 3.3* 05/20/2015 0300   CL 98* 05/20/2015 0300   CO2 28 07/10/2015 0901   CO2 25 05/20/2015 0300   BUN 14.9 07/10/2015  0901   BUN 9 05/20/2015 0300   CREATININE 0.8 07/10/2015 0901   CREATININE 0.80 05/20/2015 0300      Component Value Date/Time   CALCIUM 9.8 07/10/2015 0901   CALCIUM 7.8* 05/20/2015 0300   ALKPHOS 80 07/10/2015 0901   ALKPHOS 58 05/20/2015 0300   AST 24 07/10/2015 0901   AST 37 05/20/2015 0300   ALT 22 07/10/2015 0901   ALT 19 05/20/2015 0300   BILITOT 0.44 07/10/2015 0901   BILITOT 0.5 05/20/2015 0300       Lab Results  Component Value Date   WBC 3.9 08/21/2015   HGB 11.1* 08/21/2015   HCT 34.2* 08/21/2015   MCV 84.4 08/21/2015   PLT 185 08/21/2015   NEUTROABS 2.5 08/21/2015   ASSESSMENT & PLAN:  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:00 position, left axillary lymph node palpable, ER/PR negative, 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 5.32, T1 cN1 M0 stage II a clinical stage Treatment summary: Neo adjuvant chemotherapy with [facility] for general every 3 weeks 6 cycles started 11/01/2014 completed 02/14/2015 Post-neo-adjuvant breast MRI 02/17/2015: No residual mass or  abnormal enhancement, no enlarged lymph nodes Post-neo-adjuvant CT chest 02/18/2015: Interval resolution of the left breast mass and multiple enlarged left axillary and subpectoral lymph nodes Bilateral mastectomies 05/19/2015 Left: Radial scar, FA, UDH no malignancy 0/1 lymph node negative; right: FA, radial scar, UDH no malignancy (pathologic complete response) [id number] ---------------------------------------------------------------------------------------------------------------------------------------------------- Current treatment: Herceptin maintenance to complete April 2017 Currently on adjuvant radiation therapy started 08/06/2015  Chemotherapy-induced anemia: monitoring hemoglobin 11.1  Rash underneath the breasts bilaterally extending into the axilla: Resolved with Medrol Dosepak Return to clinic in 9 weeks for follow-up (with her last  Herceptin treatment ) and every 3 weeks for Herceptin.  No orders of the defined types were placed in this encounter.   The patient has a good understanding of the overall plan. she agrees with it. she will call with any problems that may develop before the next visit here.   Rulon Eisenmenger, MD 08/21/2015

## 2015-08-21 NOTE — Patient Instructions (Signed)
[facility] Discharge Instructions for Patients Receiving Chemotherapy  Today you received the following chemotherapy agents herceptin   If you develop nausea and vomiting that is not controlled by your nausea medication, call the clinic.   BELOW ARE SYMPTOMS THAT SHOULD BE REPORTED IMMEDIATELY:  *FEVER GREATER THAN 100.5 F  *CHILLS WITH OR WITHOUT FEVER  NAUSEA AND VOMITING THAT IS NOT CONTROLLED WITH YOUR NAUSEA MEDICATION  *UNUSUAL SHORTNESS OF BREATH  *UNUSUAL BRUISING OR BLEEDING  TENDERNESS IN MOUTH AND THROAT WITH OR WITHOUT PRESENCE OF ULCERS  *URINARY PROBLEMS  *BOWEL PROBLEMS  UNUSUAL RASH Items with * indicate a potential emergency and should be followed up as soon as possible.  Feel free to call the clinic you have any questions or concerns. The clinic phone number is [phone].  Please show the CHEMO ALERT CARD at check-in to the Emergency Department and triage nurse.   

## 2015-08-22 ENCOUNTER — Ambulatory Visit
Admission: RE | Admit: 2015-08-22 | Discharge: 2015-08-22 | Disposition: A | Discharge status: Home / Self Care | Payer: BLUE CROSS/BLUE SHIELD | Source: Ambulatory Visit | Attending: Radiation Oncology | Admitting: Radiation Oncology

## 2015-08-22 DIAGNOSIS — Z510 Encounter for antineoplastic radiation therapy: Secondary | ICD-10-CM | POA: Diagnosis not present

## 2015-08-25 ENCOUNTER — Ambulatory Visit
Admission: RE | Admit: 2015-08-25 | Discharge: 2015-08-25 | Disposition: A | Discharge status: Home / Self Care | Payer: BLUE CROSS/BLUE SHIELD | Source: Ambulatory Visit | Attending: Radiation Oncology | Admitting: Radiation Oncology

## 2015-08-25 DIAGNOSIS — Z510 Encounter for antineoplastic radiation therapy: Secondary | ICD-10-CM | POA: Diagnosis not present

## 2015-08-26 ENCOUNTER — Telehealth: Payer: Self-pay | Admitting: Oncology

## 2015-08-26 ENCOUNTER — Encounter: Payer: Self-pay | Admitting: Radiation Oncology

## 2015-08-26 ENCOUNTER — Ambulatory Visit
Admission: RE | Admit: 2015-08-26 | Discharge: 2015-08-26 | Disposition: A | Discharge status: Home / Self Care | Payer: BLUE CROSS/BLUE SHIELD | Source: Ambulatory Visit | Attending: Radiation Oncology | Admitting: Radiation Oncology

## 2015-08-26 VITALS — BP 109/64 | HR 59 | Temp 97.8°F | Ht 60.5 in | Wt 111.5 lb

## 2015-08-26 DIAGNOSIS — Z510 Encounter for antineoplastic radiation therapy: Secondary | ICD-10-CM | POA: Diagnosis not present

## 2015-08-26 DIAGNOSIS — C50412 Malignant neoplasm of upper-outer quadrant of left female breast: Secondary | ICD-10-CM

## 2015-08-26 MED ORDER — SONAFINE EX EMUL
1.0000 "application " | Freq: Once | CUTANEOUS | Status: DC
  Filled 2015-08-26: qty 45

## 2015-08-26 MED ORDER — RADIAPLEXRX EX GEL: Freq: Once | CUTANEOUS | Status: DC

## 2015-08-26 NOTE — Telephone Encounter (Addendum)
Called Cambryn and advised her that an allergy alert came up between sonafine and Zofran.  Nyjah said that Zofran did not bother her before she had chemotherapy and that now it makes her migraines worse.  Advised her to try applying the sonafine to a small area first to make sure the sonafine does not bother her.  Freyja verbalized agreement and understanding.

## 2015-08-26 NOTE — Progress Notes (Addendum)
[name] [name] has completed 15 fractions to her left chest wall.  She reports pain in left chest at a 4/10.  She takes ibuprofen and tylenol as needed.  She denies having fatigue.  She is concerned about long term problems with radiation including changes to her implants and chest tightness.  The skin on her left subclavian area, left upper back and left upper chest is red.  She is using radiaplex.  She will be given sonafine to try on the red areas as well as a refill of radiaplex.  BP 109/64 mmHg  Pulse 59  Temp(Src) 97.8 F (36.6 C) (Oral)  Ht 5' 0.5" (1.537 m)  Wt 111 lb 8 oz (50.576 kg)  BMI 21.41 kg/m2

## 2015-08-26 NOTE — Progress Notes (Signed)
  Radiation Oncology         [phone] ________________________________  Name: [name] [name] MRN: [id number]  Date: 08/26/2015  DOB: 02-May-1961  Weekly Radiation Therapy Management    ICD-9-CM ICD-10-CM   1. Breast cancer of upper-outer quadrant of left female breast (HCC) 174.4 C50.412 hyaluronate sodium (RADIAPLEXRX) gel     SONAFINE emulsion 1 application    Current Dose: 27 Gy     Planned Dose:  45 Gy  Narrative . . . . . . . . The patient presents for routine under treatment assessment.                                 [name] [name] has completed 15 fractions to her left chest wall. She reports pain in left chest at a 4/10. She takes ibuprofen and tylenol as needed. She denies having fatigue. She is concerned about long term problems with radiation including changes to her implants and chest tightness.She is considering stopping her treatment at this point.                                 Set-up films were reviewed.                                 The chart was checked. Physical Findings. . .  height is 5' 0.5" (1.537 m) and weight is 111 lb 8 oz (50.576 kg). Her oral temperature is 97.8 F (36.6 C). Her blood pressure is 109/64 and her pulse is 59. . The lungs are clear. The heart has a regular rhythm and rate. Examination of the left breast region reveals erythema in the subclavian area and upper inner aspect of the left chest wall.  No skin breakdown. Impression . . . . . . . The patient is tolerating radiation. Plan . . . . . . . . . . . . Continue treatment as planned.  I had a long discussion with the patient concerning the benefits of continuing her postmastectomy irradiation. After careful consideration she would like to continue and complete her planned course of therapy.  ________________________________   Blair Promise, PhD, MD

## 2015-08-27 ENCOUNTER — Ambulatory Visit
Admission: RE | Admit: 2015-08-27 | Discharge: 2015-08-27 | Disposition: A | Discharge status: Home / Self Care | Payer: BLUE CROSS/BLUE SHIELD | Source: Ambulatory Visit | Attending: Radiation Oncology | Admitting: Radiation Oncology

## 2015-08-27 DIAGNOSIS — Z510 Encounter for antineoplastic radiation therapy: Secondary | ICD-10-CM | POA: Diagnosis not present

## 2015-08-28 ENCOUNTER — Ambulatory Visit
Admission: RE | Admit: 2015-08-28 | Discharge: 2015-08-28 | Disposition: A | Discharge status: Home / Self Care | Payer: BLUE CROSS/BLUE SHIELD | Source: Ambulatory Visit | Attending: Radiation Oncology | Admitting: Radiation Oncology

## 2015-08-28 DIAGNOSIS — Z510 Encounter for antineoplastic radiation therapy: Secondary | ICD-10-CM | POA: Diagnosis not present

## 2015-08-29 ENCOUNTER — Ambulatory Visit: Payer: BLUE CROSS/BLUE SHIELD

## 2015-09-01 ENCOUNTER — Ambulatory Visit
Admission: RE | Admit: 2015-09-01 | Discharge: 2015-09-01 | Disposition: A | Discharge status: Home / Self Care | Payer: BLUE CROSS/BLUE SHIELD | Source: Ambulatory Visit | Attending: Radiation Oncology | Admitting: Radiation Oncology

## 2015-09-01 DIAGNOSIS — Z510 Encounter for antineoplastic radiation therapy: Secondary | ICD-10-CM | POA: Diagnosis not present

## 2015-09-02 ENCOUNTER — Ambulatory Visit
Admission: RE | Admit: 2015-09-02 | Discharge: 2015-09-02 | Disposition: A | Discharge status: Home / Self Care | Payer: BLUE CROSS/BLUE SHIELD | Source: Ambulatory Visit | Attending: Radiation Oncology | Admitting: Radiation Oncology

## 2015-09-02 ENCOUNTER — Encounter: Payer: Self-pay | Admitting: Radiation Oncology

## 2015-09-02 VITALS — BP 102/71 | HR 57 | Temp 97.5°F | Ht 60.5 in | Wt 110.6 lb

## 2015-09-02 DIAGNOSIS — Z510 Encounter for antineoplastic radiation therapy: Secondary | ICD-10-CM | POA: Diagnosis not present

## 2015-09-02 DIAGNOSIS — C50412 Malignant neoplasm of upper-outer quadrant of left female breast: Secondary | ICD-10-CM

## 2015-09-02 NOTE — Progress Notes (Signed)
[name] [name] has completed 19 fractions to her left chest wall.  She reports having pain in her left chest wall that she is rating at a 4/10.  She reports having fatigue.  She is using radiaplex, sonafine and hydrocortisone 2.5% cream.  The skin on her left subclavian, left upper back and left upper chest is red with small scabbed areas.   BP 102/71 mmHg  Pulse 57  Temp(Src) 97.5 F (36.4 C) (Oral)  Ht 5' 0.5" (1.537 m)  Wt 110 lb 9.6 oz (50.168 kg)  BMI 21.24 kg/m2

## 2015-09-02 NOTE — Progress Notes (Signed)
  Radiation Oncology         [phone] ________________________________  Name: [name] [name] MRN: [id number]  Date: 09/02/2015  DOB: 1961/04/22  Weekly Radiation Therapy Management    ICD-9-CM ICD-10-CM   1. Breast cancer of upper-outer quadrant of left female breast (HCC) 174.4 C50.412      Current Dose: 34.2 Gy     Planned Dose:  45 Gy  Narrative . . . . . . . . The patient presents for routine under treatment assessment.                                   [name] [name] has completed 19 fractions to her left chest wall. She reports having pain in her left chest wall that she is rating at a 4/10. She reports having fatigue. She is using radiaplex, sonafine and hydrocortisone 2.5% cream. The skin on her left subclavian, left upper back and left upper chest is red with small scabbed areas.                                  Set-up films were reviewed.                                 The chart was checked. Physical Findings. . .  height is 5' 0.5" (1.537 m) and weight is 110 lb 9.6 oz (50.168 kg). Her oral temperature is 97.5 F (36.4 C). Her blood pressure is 102/71 and her pulse is 57. . Lungs are clear. The heart has a regular rhythm and rate. The patient has brisk reaction along the treatment areas but no moist desquamation Impression . . . . . . . The patient is tolerating radiation. Plan . . . . . . . . . . . . Continue treatment as planned.  ________________________________   Blair Promise, PhD, MD

## 2015-09-03 ENCOUNTER — Ambulatory Visit: Payer: BLUE CROSS/BLUE SHIELD

## 2015-09-04 ENCOUNTER — Ambulatory Visit
Admission: RE | Admit: 2015-09-04 | Discharge: 2015-09-04 | Disposition: A | Discharge status: Home / Self Care | Payer: BLUE CROSS/BLUE SHIELD | Source: Ambulatory Visit | Attending: Radiation Oncology | Admitting: Radiation Oncology

## 2015-09-04 DIAGNOSIS — Z510 Encounter for antineoplastic radiation therapy: Secondary | ICD-10-CM | POA: Diagnosis not present

## 2015-09-05 ENCOUNTER — Encounter: Payer: Self-pay | Admitting: General Practice

## 2015-09-05 ENCOUNTER — Ambulatory Visit
Admission: RE | Admit: 2015-09-05 | Discharge: 2015-09-05 | Disposition: A | Discharge status: Home / Self Care | Payer: BLUE CROSS/BLUE SHIELD | Source: Ambulatory Visit | Attending: Radiation Oncology | Admitting: Radiation Oncology

## 2015-09-05 DIAGNOSIS — Z510 Encounter for antineoplastic radiation therapy: Secondary | ICD-10-CM | POA: Diagnosis not present

## 2015-09-05 NOTE — Progress Notes (Signed)
Spiritual Care Note  Followed up with [name] [name] after her radiation treatment, providing emotional support as she processed updates about family health stressors and encouragement as she shared about her Lenten prayer practice in the [facility] healing garden.  Per pt, she is very comfortable calling for f/u support and may page during her next chemo.  Please also page as needs arise/circumstances change.  Thank you!  [place], [place], [facility] Pager [phone] Voicemail  [phone]

## 2015-09-08 ENCOUNTER — Ambulatory Visit
Admission: RE | Admit: 2015-09-08 | Discharge: 2015-09-08 | Disposition: A | Discharge status: Home / Self Care | Payer: BLUE CROSS/BLUE SHIELD | Source: Ambulatory Visit | Attending: Radiation Oncology | Admitting: Radiation Oncology

## 2015-09-08 DIAGNOSIS — C50912 Malignant neoplasm of unspecified site of left female breast: Secondary | ICD-10-CM | POA: Insufficient documentation

## 2015-09-08 DIAGNOSIS — Z510 Encounter for antineoplastic radiation therapy: Secondary | ICD-10-CM | POA: Insufficient documentation

## 2015-09-08 DIAGNOSIS — Z171 Estrogen receptor negative status [ER-]: Secondary | ICD-10-CM | POA: Insufficient documentation

## 2015-09-08 DIAGNOSIS — C969 Malignant neoplasm of lymphoid, hematopoietic and related tissue, unspecified: Secondary | ICD-10-CM | POA: Diagnosis not present

## 2015-09-09 ENCOUNTER — Telehealth: Payer: Self-pay | Admitting: *Deleted

## 2015-09-09 ENCOUNTER — Ambulatory Visit (HOSPITAL_COMMUNITY)
Admission: RE | Admit: 2015-09-09 | Discharge: 2015-09-09 | Disposition: A | Discharge status: Home / Self Care | Payer: BLUE CROSS/BLUE SHIELD | Source: Ambulatory Visit | Attending: Internal Medicine | Admitting: Internal Medicine

## 2015-09-09 ENCOUNTER — Encounter: Payer: Self-pay | Admitting: Radiation Oncology

## 2015-09-09 ENCOUNTER — Ambulatory Visit: Payer: BLUE CROSS/BLUE SHIELD

## 2015-09-09 ENCOUNTER — Encounter (HOSPITAL_COMMUNITY): Payer: Self-pay | Admitting: Internal Medicine

## 2015-09-09 ENCOUNTER — Ambulatory Visit (HOSPITAL_BASED_OUTPATIENT_CLINIC_OR_DEPARTMENT_OTHER)
Admission: RE | Admit: 2015-09-09 | Discharge: 2015-09-09 | Disposition: A | Discharge status: Home / Self Care | Payer: BLUE CROSS/BLUE SHIELD | Source: Ambulatory Visit | Attending: Internal Medicine | Admitting: Internal Medicine

## 2015-09-09 ENCOUNTER — Ambulatory Visit
Admission: RE | Admit: 2015-09-09 | Discharge: 2015-09-09 | Disposition: A | Discharge status: Home / Self Care | Payer: BLUE CROSS/BLUE SHIELD | Source: Ambulatory Visit | Attending: Radiation Oncology | Admitting: Radiation Oncology

## 2015-09-09 VITALS — BP 100/66 | HR 58 | Temp 97.4°F | Ht 60.5 in | Wt 108.2 lb

## 2015-09-09 VITALS — BP 88/62 | HR 65 | Wt 109.2 lb

## 2015-09-09 DIAGNOSIS — C50412 Malignant neoplasm of upper-outer quadrant of left female breast: Secondary | ICD-10-CM

## 2015-09-09 DIAGNOSIS — Z510 Encounter for antineoplastic radiation therapy: Secondary | ICD-10-CM | POA: Diagnosis not present

## 2015-09-09 LAB — ECHOCARDIOGRAM COMPLETE
AORTIC ROOT 2D: 18 mm
CHL CUP LA VOL 2D INDEX: 17.3 mL/m2
E/e' ratio: 5.62
EWDT: 317 ms
FS: 55 % — AB (ref 28–44)
Height: 60.5 in
IV/PV OW: 0.71
LA ID, A-P, ES: 27 mm
LA SIZE INDEX: 1.84 mm/m2
LA VOL 2D: 25.4 mL
LA vol index: 16 mL/m2
LA vol: 23.4 mL
LDCA: 2.01 cm2
LEFT ATRIUM END SYS DIAM: 27 mm
LV PW d: 8.28 mm — AB (ref 0.6–1.1)
LV SIMPSON'S DISK: 57
LV dias vol index: 33 mL/m2
LV dias vol: 48 mL (ref 46–106)
LV sys vol: 48 mL — AB (ref 14–42)
LVIDD: 36.3 mm — AB (ref 3.5–6.0)
LVIDS: 16.3 mm — AB (ref 2.1–4.0)
LVOTD: 16 mm
LVSYSVOLIN: 14 mL/m2
MV Dec: 317 ms
MV pk A vel: 43.2 cm/s
MVPKEVEL: 63.5 cm/s
PWSYS: 8.28 mm
SV INDEX: 18.4 mL/m2
Single Plane A4C EF: 56 %
Stroke v: 27 ml
TDI e' lateral: 11.3 cm/s
TDI e' medial: 10.9 cm/s
WEIGHTICAEL: 1731.2 [oz_av]

## 2015-09-09 MED ORDER — SONAFINE EX EMUL
1.0000 "application " | Freq: Once | CUTANEOUS | Status: AC
  Administered 2015-09-09: 1 via TOPICAL
  Filled 2015-09-09: qty 45

## 2015-09-09 MED ORDER — RADIAPLEXRX EX GEL
Freq: Once | CUTANEOUS | Status: AC
  Administered 2015-09-09: 13:00:00 via TOPICAL

## 2015-09-09 NOTE — Telephone Encounter (Signed)
CALLED PATIENT TO INFORM OF PT APPT. FOR 09-19-15 - ARRIVAL TIME - 9:15 AM, SPOKE WITH PATIENT AND SHE IS AWARE OF THIS APPT.

## 2015-09-09 NOTE — Progress Notes (Signed)
  Echocardiogram 2D Echocardiogram has been performed.  Jennette Dubin 09/09/2015, 3:11 PM

## 2015-09-09 NOTE — Progress Notes (Signed)
  Radiation Oncology         [phone] ________________________________  Name: [name] [name] MRN: [id number]  Date: 09/09/2015  DOB: Nov 11, 1960  Weekly Radiation Therapy Management    ICD-9-CM ICD-10-CM   1. Breast cancer of upper-outer quadrant of left female breast (HCC) 174.4 C50.412 SONAFINE emulsion 1 application     hyaluronate sodium (RADIAPLEXRX) gel     Current Dose: 41.4 Gy     Planned Dose:  45 Gy  Narrative . . . . . . . . The patient presents for routine under treatment assessment.                                   The patient Some discomfort in the skin reaction along the left chest region. She does have some itching in the left upper back. She denies any drainage from areas of involvement. She continues to work. She reports decreased range of motion in her left arm and shoulder and will set her up with physical therapy to start in approximately 2 weeks                                 Set-up films were reviewed.                                 The chart was checked. Physical Findings. . .  height is 5' 0.5" (1.537 m) and weight is 108 lb 3.2 oz (49.079 kg). Her temperature is 97.4 F (36.3 C). Her blood pressure is 100/66 and her pulse is 58. . The lungs are clear. The heart has a regular rhythm and rate. The patient has brisk reaction in several areas but no moist desquamation. Impression . . . . . . . The patient is tolerating radiation. Plan . . . . . . . . . . . . Continue treatment as planned.  ________________________________   Blair Promise, PhD, MD

## 2015-09-09 NOTE — Progress Notes (Signed)
Patient ID: [name] [name], female   DOB: 02-03-1961, 55 y.o.   MRN: [id number]   Cardio-Oncology Clinic Note   Patient ID: [name] [name], female   DOB: Dec 22, 1960, 55 y.o.   MRN: [id number]  Referring Physician: Dr. Gevena Mart Primary Care: Primary Cardiologist: Dr. Haroldine Laws   HPI: [name] [name] is a 55 y.o. female diagnosed with Breast CA in 09/2014. Breast cancer profile includes left breast invasive ductal carcinoma Stage 2b, grade 3, ER/PR negative, 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 5.32, axillary lymph node also positive for cancer. CT C/A/P with no evidence of metastasis but enlarged left axillary and left subpectoral lymph nodes. She presents today to establish in the Cardio-oncology clinic.  Pt began neoadjuvant [place] Perjeta to 3 weeks 6 cycles followed by Herceptin maintenance 11/01/2014. Would finish Herceptin 10/2015.   Echo 10/21/14 LVEF 60-65%, Mild MR   Global strain -23.4 Echo 01/22/15 LVEF 55-60%, Mild to Mod MR Lateral S' 13.6, Global strain -23.2 Echo 05/01/15 LVEF 55-60%, Global Strain -21.1 per Dr. Haroldine Laws  Echo 09/09/15 LVEF 55-60%  Lateral s' 12.7 GLS -21.9%  She presents today for cardio-oncology follow up. Underwent bilateral mastectomies with immediate reconstruction on 05/19/15. Now undergoing XRT. Feeling well. Starting to exercise again.  No problems with Herceptin has 2 more treatments left.      Past Medical History  Diagnosis Date  . Osteopenia   . Neurogenic bladder   . Wears contact lenses   . Family history of breast cancer   . Family history of colon cancer   . Breast cancer of upper-outer quadrant of right female breast ([place]) 10/11/2014  . Breast cancer ([place]) 2016    ER-/PR-/Her2+  . Migraines     takes metoprolol  for migraine headaches   Current Outpatient Prescriptions  Medication Sig Dispense Refill  . acetaminophen (TYLENOL) 325 MG tablet Take 650 mg by mouth every 6 (six) hours as needed for mild pain.     Marland Kitchen atenolol (TENORMIN) 25 MG tablet Take 75 mg  by mouth daily. For migraines not blood pressure    . B Complex-C (B-COMPLEX WITH VITAMIN C) tablet Take 1 tablet by mouth daily.    . betamethasone valerate (VALISONE) 0.1 % cream Apply topically. Reported on 09/09/2015    . Calcium Carbonate (CALCIUM 600 PO) Take 1 tablet by mouth daily.     . cholecalciferol (VITAMIN D) 1000 UNITS tablet Take 1,000 Units by mouth daily.    . Diclofenac Potassium 50 MG PACK Take 50 mg by mouth. Reported on 09/09/2015    . eletriptan (RELPAX) 40 MG tablet Take 1 tablet (40 mg total) by mouth every 2 (two) hours as needed for migraine or headache (Max 2 per day). 10 tablet 3  . hyaluronate sodium (RADIAPLEXRX) GEL Apply 1 application topically 2 (two) times daily.    . hydrocortisone 2.5 % ointment Apply topically 2 (two) times daily. 30 g 0  . Lactobacillus (ACIDOPHILUS PO) Take 1 capsule by mouth daily.     Marland Kitchen lidocaine-prilocaine (EMLA) cream Apply to affected area once (Patient taking differently: Apply 1 application topically as needed. Apply to affected area once) 30 g 3  . PARoxetine (PAXIL) 20 MG tablet Take 20 mg by mouth.    . docusate sodium (COLACE) 100 MG capsule Take 100 mg by mouth 2 (two) times daily as needed for mild constipation. Reported on 0/26/3785    . non-metallic deodorant Jethro Poling) MISC Apply 1 application topically daily as needed. Reported on 09/09/2015    .  promethazine (PHENERGAN) 12.5 MG tablet Take 1 tablet (12.5 mg total) by mouth every 6 (six) hours as needed for nausea or vomiting. (Patient not taking: Reported on 09/09/2015) 90 tablet 3   No current facility-administered medications for this encounter.   Allergies  Allergen Reactions  . Meperidine Nausea And Vomiting  . Stadol [Butorphanol] Nausea And Vomiting  . Zofran [Ondansetron Hcl] Other (See Comments)    Makes migraine headaches intensify   . Sulfa Antibiotics Rash    Social History   Social History  . Marital Status: Married    Spouse Name: N/A  . Number of Children:  3  . 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 Smoking status: Never Smoker   . Smokeless tobacco: Not on file  . Alcohol Use: Yes     Comment: 4-5  . Drug Use: No  . Sexual Activity: Not on file   Other Topics Concern  . Not on file   Social History Narrative    Family History  Problem Relation Age of Onset  . Brain cancer Mother 92    Glioblastoma  . Colon cancer Father 57  . Colon cancer Maternal Uncle 60  . Lung cancer Paternal Aunt   . Lung cancer Maternal Grandfather 90  . Lung cancer Paternal Grandfather 34  . Melanoma Brother 27    insitu  . Rheumatic fever Maternal Grandmother     in her 15s  . Breast cancer Paternal Grandmother 40   Filed Vitals:   09/09/15 1513  BP: 88/62  Pulse: 65  Weight: 109 lb 4 oz (49.555 kg)  SpO2: 100%    PHYSICAL EXAM: General:  WDWN, well appearing HEENT: Normal Neck: supple. no JVD. Carotids 2+ bilat; no bruits. No thyromegaly or nodule noted Cor: PMI nondisplaced. RRR. No rubs, gallops or murmurs. Lungs: Clear, normal  Abdomen: soft, non-tender, non-distended, No HSM. No bruits or masses. +BS Extremities: no cyanosis, clubbing, rash, edema. 2+ DP pulse bilaterally. Neuro: alert & oriented x 3, cranial nerves grossly intact. moves all 4 extremities w/o difficulty. Affect pleasant.  ASSESSMENT & PLAN:   1. Breast Cancer Profile:  Left breast invasive ductal carcinoma Stage 2b, grade 3, ER/PR negative, 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 5.32, axillary lymph node also positive for cancer. -Doing very well. Echo parameters stable on herceptin treatment.    Legrand Como "Jonni Sanger" Empire, PA-C 09/09/2015   Patient seen and examined with Oda Kilts, PA-C. We discussed all aspects of the encounter. I agree with the assessment and plan as stated above.   I reviewed echos personally. EF and Doppler parameters stable. No HF on exam. Continue Herceptin. She will complete Herceptin after  next 2 treatments. No need for further echos. Can return PRN.  Tyyne Cliett,MD 10:33 AM

## 2015-09-09 NOTE — Progress Notes (Signed)
Ms. Fenley presents for her 23rd fraction of radiation to her Left Chest Wall. She reports she has been having some fatigue at night after the activities of the day. She reports "tightness" in her Left arm . She is doing her arm exercises at home, and reports that this helps that. Her Supraclavicular area, the upper center of her breast, and her back upper left shoulder are red. She reports burning and itching in these areas. She is using sonafine and radiaplex cream to these areas with some relief. I will provide her with additional sonafine and radiaplex today. She was given a follow up appointment.   BP 100/66 mmHg  Pulse 58  Temp(Src) 97.4 F (36.3 C)  Ht 5' 0.5" (1.537 m)  Wt 108 lb 3.2 oz (49.079 kg)  BMI 20.78 kg/m2

## 2015-09-10 ENCOUNTER — Ambulatory Visit: Payer: BLUE CROSS/BLUE SHIELD

## 2015-09-11 ENCOUNTER — Ambulatory Visit
Admission: RE | Admit: 2015-09-11 | Discharge: 2015-09-11 | Disposition: A | Discharge status: Home / Self Care | Payer: BLUE CROSS/BLUE SHIELD | Source: Ambulatory Visit | Attending: Radiation Oncology | Admitting: Radiation Oncology

## 2015-09-11 ENCOUNTER — Ambulatory Visit (HOSPITAL_BASED_OUTPATIENT_CLINIC_OR_DEPARTMENT_OTHER): Payer: BLUE CROSS/BLUE SHIELD

## 2015-09-11 ENCOUNTER — Ambulatory Visit: Payer: BLUE CROSS/BLUE SHIELD

## 2015-09-11 VITALS — BP 96/51 | HR 59 | Temp 97.8°F | Resp 18

## 2015-09-11 DIAGNOSIS — D6481 Anemia due to antineoplastic chemotherapy: Secondary | ICD-10-CM

## 2015-09-11 DIAGNOSIS — Z5112 Encounter for antineoplastic immunotherapy: Secondary | ICD-10-CM | POA: Diagnosis not present

## 2015-09-11 DIAGNOSIS — Z510 Encounter for antineoplastic radiation therapy: Secondary | ICD-10-CM | POA: Diagnosis not present

## 2015-09-11 DIAGNOSIS — T451X5A Adverse effect of antineoplastic and immunosuppressive drugs, initial encounter: Principal | ICD-10-CM

## 2015-09-11 DIAGNOSIS — C50412 Malignant neoplasm of upper-outer quadrant of left female breast: Secondary | ICD-10-CM

## 2015-09-11 MED ORDER — ACETAMINOPHEN 325 MG PO TABS: 650.0000 mg | ORAL_TABLET | Freq: Once | ORAL | Status: DC

## 2015-09-11 MED ORDER — TRASTUZUMAB CHEMO INJECTION 440 MG
6.0000 mg/kg | Freq: Once | INTRAVENOUS | Status: AC
  Administered 2015-09-11: 294 mg via INTRAVENOUS
  Filled 2015-09-11: qty 14

## 2015-09-11 MED ORDER — DIPHENHYDRAMINE HCL 25 MG PO CAPS
ORAL_CAPSULE | ORAL | Status: AC
  Filled 2015-09-11: qty 2

## 2015-09-11 MED ORDER — DIPHENHYDRAMINE HCL 25 MG PO CAPS: 50.0000 mg | ORAL_CAPSULE | Freq: Once | ORAL | Status: DC

## 2015-09-11 MED ORDER — SODIUM CHLORIDE 0.9 % IV SOLN
Freq: Once | INTRAVENOUS | Status: AC
  Administered 2015-09-11: 09:00:00 via INTRAVENOUS

## 2015-09-11 MED ORDER — HEPARIN SOD (PORK) LOCK FLUSH 100 UNIT/ML IV SOLN
500.0000 [IU] | Freq: Once | INTRAVENOUS | Status: AC | PRN
  Administered 2015-09-11: 500 [IU]
  Filled 2015-09-11: qty 5

## 2015-09-11 MED ORDER — ACETAMINOPHEN 325 MG PO TABS
ORAL_TABLET | ORAL | Status: AC
  Filled 2015-09-11: qty 2

## 2015-09-11 MED ORDER — SODIUM CHLORIDE 0.9 % IJ SOLN
10.0000 mL | INTRAMUSCULAR | Status: DC | PRN
  Administered 2015-09-11: 10 mL
  Filled 2015-09-11: qty 10

## 2015-09-11 NOTE — Patient Instructions (Signed)
[facility] Discharge Instructions for Patients Receiving Chemotherapy  Today you received the following chemotherapy agents:  Herceptin  To help prevent nausea and vomiting after your treatment, we encourage you to take your nausea medication as prescribed.   If you develop nausea and vomiting that is not controlled by your nausea medication, call the clinic.   BELOW ARE SYMPTOMS THAT SHOULD BE REPORTED IMMEDIATELY:  *FEVER GREATER THAN 100.5 F  *CHILLS WITH OR WITHOUT FEVER  NAUSEA AND VOMITING THAT IS NOT CONTROLLED WITH YOUR NAUSEA MEDICATION  *UNUSUAL SHORTNESS OF BREATH  *UNUSUAL BRUISING OR BLEEDING  TENDERNESS IN MOUTH AND THROAT WITH OR WITHOUT PRESENCE OF ULCERS  *URINARY PROBLEMS  *BOWEL PROBLEMS  UNUSUAL RASH Items with * indicate a potential emergency and should be followed up as soon as possible.  Feel free to call the clinic you have any questions or concerns. The clinic phone number is [phone].  Please show the CHEMO ALERT CARD at check-in to the Emergency Department and triage nurse.   

## 2015-09-12 ENCOUNTER — Encounter: Payer: Self-pay | Admitting: Radiation Oncology

## 2015-09-12 ENCOUNTER — Telehealth: Payer: Self-pay | Admitting: *Deleted

## 2015-09-12 ENCOUNTER — Ambulatory Visit
Admission: RE | Admit: 2015-09-12 | Discharge: 2015-09-12 | Disposition: A | Discharge status: Home / Self Care | Payer: BLUE CROSS/BLUE SHIELD | Source: Ambulatory Visit | Attending: Radiation Oncology | Admitting: Radiation Oncology

## 2015-09-12 DIAGNOSIS — Z510 Encounter for antineoplastic radiation therapy: Secondary | ICD-10-CM | POA: Diagnosis not present

## 2015-09-12 NOTE — Telephone Encounter (Signed)
Spoke with patient to follow up after radiation completion.  She is doing well.  She is so glad to be done.  Encouraged her to call with any needs or concerns.

## 2015-09-15 ENCOUNTER — Ambulatory Visit: Payer: BLUE CROSS/BLUE SHIELD

## 2015-09-19 ENCOUNTER — Ambulatory Visit: Payer: BLUE CROSS/BLUE SHIELD | Attending: Plastic Surgery | Admitting: Physical Therapy

## 2015-09-19 DIAGNOSIS — M25612 Stiffness of left shoulder, not elsewhere classified: Secondary | ICD-10-CM | POA: Diagnosis present

## 2015-09-19 DIAGNOSIS — M25611 Stiffness of right shoulder, not elsewhere classified: Secondary | ICD-10-CM

## 2015-09-19 DIAGNOSIS — Z9189 Other specified personal risk factors, not elsewhere classified: Secondary | ICD-10-CM | POA: Diagnosis present

## 2015-09-19 DIAGNOSIS — M629 Disorder of muscle, unspecified: Secondary | ICD-10-CM | POA: Insufficient documentation

## 2015-09-19 DIAGNOSIS — M2420 Disorder of ligament, unspecified site: Secondary | ICD-10-CM

## 2015-09-19 NOTE — Therapy (Signed)
[place], [place], [address] Phone: [phone]   Fax:  [phone]  Physical Therapy Treatment  Patient Details  Name: [name] [name] MRN: [id number] Date of Birth: 03-12-1961 Referring Provider: Harlow Mares   Encounter Date: 09/19/2015      PT End of Session - 09/19/15 1303    Visit Number 18   Number of Visits 25   Date for PT Re-Evaluation 10/17/15   PT Start Time 0930   PT Stop Time 1015   PT Time Calculation (min) 45 min   Activity Tolerance Patient tolerated treatment well   Behavior During Therapy [facility] for tasks assessed/performed      Past Medical History  Diagnosis Date  . Osteopenia   . Neurogenic bladder   . Wears contact lenses   . Family history of breast cancer   . Family history of colon cancer   . Breast cancer of upper-outer quadrant of right female breast ([place]) 10/11/2014  . Breast cancer ([place]) 2016    ER-/PR-/Her2+  . Migraines     takes metoprolol  for migraine headaches    Past Surgical History  Procedure Laterality Date  . Vein ligation      both legs  . Colonoscopy    . Upper gi endoscopy    . Portacath placement Right 10/29/2014    Procedure: INSERTION PORT-A-CATH WITH ULTRASOUND;  Surgeon: Erroll Luna, MD;  Location: [facility];  Service: General;  Laterality: Right;  . Breast surgery      rt, breast biopsy  . Nipple sparing mastectomy/sentinal lymph node biopsy/reconstruction/placement of tissue expander Bilateral 05/19/2015    Procedure: BILATERAL NIPPLE SPARING MASTECTOMY WITH LEFT AXILLARY SENTINAL LYMPH NODE BIOPSY;  Surgeon: Erroll Luna, MD;  Location: [facility];  Service: General;  Laterality: Bilateral;  . Breast reconstruction with placement of tissue expander and flex hd (acellular hydrated dermis) Bilateral 05/19/2015    Procedure: BILATERAL NIPPLE SPARING MASTECTOMY WITH LEFT AXILLARY SENTINAL LYMPH NODE BIOPSY;  Surgeon: Erroll Luna,  MD;  Location: [facility];  Service: General;  Laterality: Bilateral;    There were no vitals filed for this visit.  Visit Diagnosis:  Shoulder stiffness, left  Shoulder stiffness, right  At risk for lymphedema  Disorder of muscle, ligament, and fascia      Subjective Assessment - 09/19/15 0937    Subjective Completed raidation March 17 and "Its tight!!"  She had not any skin breakdown or swelling in arms, Continued to do stretches and maintained strengthening throughout.    Patient is accompained by: Family member   Pertinent History Diagnosed 10/03/14 with left ER/PR negative, HER2 positive breast cancer.  She also has a positive left axillary node. Bilateral mastectomy with 2 nodes removed from the left side, none on the right.on Nov. 21 with immedicate silicone implants. Has had chemotherapy and will continue with target therapy till June and will see about radition therapy in  January.     Patient Stated Goals pt wants to get back on the tennis court  by May    Currently in Pain? Yes   Pain Score 4    Pain Location Chest   Pain Orientation Left   Pain Descriptors / Indicators Burning;Numbness;Tightness   Pain Type Chronic pain   Aggravating Factors  hiking on an unually warm day    Pain Relieving Factors rest             [facility] PT Assessment - 09/19/15 0001    Observation/Other  Assessments   Observations reddend skin at left anterior and posterior chest with only small shallow open area    Posture/Postural Control   Posture/Postural Control Postural limitations   Postural Limitations Rounded Shoulders;Forward head;Increased thoracic kyphosis  pt can correct with effort    AROM   Overall AROM Comments assymetrical scapular movement    Right Shoulder Extension 60 Degrees   Right Shoulder Flexion 139 Degrees   Right Shoulder ABduction 150 Degrees   Left Shoulder Extension 55 Degrees   Left Shoulder Flexion 135 Degrees   Left Shoulder ABduction 110 Degrees   Strength    Overall Strength Comments at least 3-/5 in both shoulders with straining to lift arms higher    Palpation   Palpation comment tender trigger points at left posterior axillary muscles at lateral border of scapula                      [facility] Adult PT Treatment/Exercise - 09/19/15 0001    Self-Care   Other Self-Care Comments  discussed multiple websites and options for continued execise on her own in he comunity    Shoulder Exercises: Standing   Other Standing Exercises doorwary shoulder extension stretch                   Short Term Clinic Goals - 06/13/15 1307    CC Short Term Goal  #1   Title Short term goals = long term goals             Long Term Clinic Goals - 09/19/15 1320    CC Long Term Goal  #1   Title Patient will improve left shoulder flexion range of motion to 150 degrees so reach up in her kitchen cabinets    Time 4   Period Weeks   Status Revised   CC Long Term Goal  #2   Title Patient will improve right shoulder flexion range of motion to 150  degrees to perform activities of daily living and household chores with greater ease   Time 4   Period Weeks   Status Achieved   CC Long Term Goal  #3   Title Patient will improve left shoulder abduction to 150 degrees so that she can wash her hair with greater ease     Time 4   Status Revised   CC Long Term Goal  #4   Title Patient will improve right shoulder abduction to 150 degrees so that she can wash her hair with greater ease   Time 4   Period Weeks   Status Revised   CC Long Term Goal  #5   Title Patient will be independent in advanced home exercise program   Time 4   Period Weeks   Status On-going   CC Long Term Goal  #6   Title Pt will be independent with scar massage   Time 4   Period Weeks   Status On-going            Plan - 09/19/15 1311    Clinical Impression Statement Pt has completed her radiation treatment and returns to PT to improve her shoulder range of motion.   She has decreased measurements from last visit. She has redness of skin, but no open areas and reports feeling very "tight" deep inside, below the skin. She will benefit from continued stretching. active movement and dynamic stretching to incrase shoulder range of motion with porgression to strenth as she tolerates  Pt will benefit from skilled therapeutic intervention in order to improve on the following deficits Decreased range of motion;Postural dysfunction;Increased fascial restricitons;Impaired UE functional use;Decreased activity tolerance;Decreased skin integrity;Decreased scar mobility;Impaired flexibility;Decreased strength   Clinical Impairments Affecting Rehab Potential continues with target therapy through IV every 3 weeks  Finished radiation treatment on 09/12/2015 .   PT Frequency 2x / week   PT Duration 4 weeks   PT Treatment/Interventions Passive range of motion;Manual techniques;Therapeutic exercise;Neuromuscular re-education   PT Next Visit Plan A/AA/PROM with stretching .  Resume pulleys, finger ladder, ball on wall, table stretch modified downward dog.  try supine streth over folded pillow to upper back with head supported    Consulted and Agree with Plan of Care Patient        Problem List Patient Active Problem List   Diagnosis Date Noted  . Allergic dermatitis 06/19/2015  . Nausea without vomiting 12/30/2014  . Diarrhea 12/30/2014  . Dehydration 12/30/2014  . Hypotension 12/30/2014  . 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 External hemorrhoid 12/13/2014  . UTI (urinary tract infection) 11/16/2014  . Genetic testing 11/07/2014  . Migraine headache 11/06/2014  . Family history of breast cancer   . Family history of colon cancer   . Breast cancer of upper-outer quadrant of left female breast ([place]) 10/11/2014   Donato Heinz. Owens Shark, PT   09/19/2015, 1:25 PM  [place], [place], [address] Phone: [phone]   Fax:  [phone]  Name: AVREE SZCZYGIEL MRN: [id number] Date of Birth: May 05, 1961

## 2015-09-21 NOTE — Progress Notes (Signed)
  Radiation Oncology         [phone] ________________________________  Name: [name] [name] MRN: [id number]  Date: 09/12/2015  DOB: 1961-01-26  End of Treatment Note  ICD-9-CM ICD-10-CM     1. 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   Patient developed itching and discomfort as well as fatigue. She developed brisk erythema in the treatment area but no moist desquamation. The patient initially wished to discontinue her therapy but after reconsideration she proceeded with the prescribed course of therapy.  Plan: The patient has completed radiation treatment. The patient will return to radiation oncology clinic for routine followup in one month. I advised them to call or return sooner if they have any questions or concerns related to their recovery or treatment.  -----------------------------------  Blair Promise, PhD, MD

## 2015-09-23 ENCOUNTER — Ambulatory Visit: Payer: BLUE CROSS/BLUE SHIELD | Admitting: Physical Therapy

## 2015-09-23 DIAGNOSIS — M629 Disorder of muscle, unspecified: Secondary | ICD-10-CM

## 2015-09-23 DIAGNOSIS — M25612 Stiffness of left shoulder, not elsewhere classified: Secondary | ICD-10-CM

## 2015-09-23 DIAGNOSIS — M25611 Stiffness of right shoulder, not elsewhere classified: Secondary | ICD-10-CM

## 2015-09-23 NOTE — Therapy (Signed)
[place], [place], [address] Phone: [phone]   Fax:  [phone]  Physical Therapy Treatment  Patient Details  Name: [name] [name] MRN: [id number] Date of Birth: 08-13-1960 Referring Provider: Harlow Mares   Encounter Date: 09/23/2015      PT End of Session - 09/23/15 1229    Visit Number 19   Number of Visits 25   Date for PT Re-Evaluation 10/17/15   PT Start Time 0858  pt. late   PT Stop Time 0932   PT Time Calculation (min) 34 min   Activity Tolerance Patient tolerated treatment well;Patient limited by pain   Behavior During Therapy [facility] for tasks assessed/performed      Past Medical History  Diagnosis Date  . Osteopenia   . Neurogenic bladder   . Wears contact lenses   . Family history of breast cancer   . Family history of colon cancer   . Breast cancer of upper-outer quadrant of right female breast ([place]) 10/11/2014  . Breast cancer ([place]) 2016    ER-/PR-/Her2+  . Migraines     takes metoprolol  for migraine headaches    Past Surgical History  Procedure Laterality Date  . Vein ligation      both legs  . Colonoscopy    . Upper gi endoscopy    . Portacath placement Right 10/29/2014    Procedure: INSERTION PORT-A-CATH WITH ULTRASOUND;  Surgeon: Erroll Luna, MD;  Location: [place];  Service: General;  Laterality: Right;  . Breast surgery      rt, breast biopsy  . Nipple sparing mastectomy/sentinal lymph node biopsy/reconstruction/placement of tissue expander Bilateral 05/19/2015    Procedure: BILATERAL NIPPLE SPARING MASTECTOMY WITH LEFT AXILLARY SENTINAL LYMPH NODE BIOPSY;  Surgeon: Erroll Luna, MD;  Location: [place];  Service: General;  Laterality: Bilateral;  . Breast reconstruction with placement of tissue expander and flex hd (acellular hydrated dermis) Bilateral 05/19/2015    Procedure: BILATERAL NIPPLE SPARING MASTECTOMY WITH LEFT AXILLARY SENTINAL LYMPH NODE  BIOPSY;  Surgeon: Erroll Luna, MD;  Location: [place];  Service: General;  Laterality: Bilateral;    There were no vitals filed for this visit.  Visit Diagnosis:  Shoulder stiffness, left  Shoulder stiffness, right  Disorder of muscle, ligament, and fascia      Subjective Assessment - 09/23/15 0859    Subjective "I've already stretched this morning."  It's tight in the morning.   Currently in Pain? Yes   Pain Score 2    Pain Location Axilla   Pain Orientation Left;Anterior   Pain Descriptors / Indicators Sore;Tightness   Pain Relieving Factors stretching                         [facility] Adult PT Treatment/Exercise - 09/23/15 0001    Shoulder Exercises: Supine   Other Supine Exercises supine over towel roll--shoulder horizontal abduction 30 seconds x 2   Other Supine Exercises supine over towel roll:  active scapular retraction and isometric shouder extension 5 counts x 10   Manual Therapy   Manual Therapy Scapular mobilization   Myofascial Release left UE myofascial pulling in supine and then to right sidelying   Scapular Mobilization of left scapula in right sidelying, gentle rhythmic motions into protraction and stretch into depression for upper trap stretch   Passive ROM In supine, right shoulder for IR, ER, abduction, and flexion, with stretch to patient tolerance.; supine over towel roll left horizontal  abduction stretch passively.                   Short Term Clinic Goals - 06/13/15 1307    CC Short Term Goal  #1   Title Short term goals = long term goals             Long Term Clinic Goals - 09/19/15 1320    CC Long Term Goal  #1   Title Patient will improve left shoulder flexion range of motion to 150 degrees so reach up in her kitchen cabinets    Time 4   Period Weeks   Status Revised   CC Long Term Goal  #2   Title Patient will improve right shoulder flexion range of motion to 150  degrees to perform activities of daily living  and household chores with greater ease   Time 4   Period Weeks   Status Achieved   CC Long Term Goal  #3   Title Patient will improve left shoulder abduction to 150 degrees so that she can wash her hair with greater ease     Time 4   Status Revised   CC Long Term Goal  #4   Title Patient will improve right shoulder abduction to 150 degrees so that she can wash her hair with greater ease   Time 4   Period Weeks   Status Revised   CC Long Term Goal  #5   Title Patient will be independent in advanced home exercise program   Time 4   Period Weeks   Status On-going   CC Long Term Goal  #6   Title Pt will be independent with scar massage   Time 4   Period Weeks   Status On-going            Plan - 09/23/15 1229    Clinical Impression Statement Patient late for appointment today, but had done some stretching already this morning.  Focus was on manual stretching; still with tightness and discomfort that limits how far she stretches.   Pt will benefit from skilled therapeutic intervention in order to improve on the following deficits Decreased range of motion;Postural dysfunction;Increased fascial restricitons;Impaired UE functional use;Decreased activity tolerance;Decreased skin integrity;Decreased scar mobility;Impaired flexibility;Decreased strength   Rehab Potential Excellent   Clinical Impairments Affecting Rehab Potential continues with target therapy through IV every 3 weeks  Finished radiation treatment on 09/12/2015 .   PT Frequency 2x / week   PT Duration 4 weeks   PT Treatment/Interventions Passive range of motion;Manual techniques;Therapeutic exercise   PT Next Visit Plan Per primary PT plan of care:  A/AA/PROM with stretching .  Resume pulleys, finger ladder, ball on wall, table stretch modified downward dog.  try supine streth over folded pillow to upper back with head supported    Consulted and Agree with Plan of Care Patient        Problem List Patient Active  Problem List   Diagnosis Date Noted  . Allergic dermatitis 06/19/2015  . Nausea without vomiting 12/30/2014  . Diarrhea 12/30/2014  . Dehydration 12/30/2014  . Hypotension 12/30/2014  . 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 External hemorrhoid 12/13/2014  . UTI (urinary tract infection) 11/16/2014  . Genetic testing 11/07/2014  . Migraine headache 11/06/2014  . Family history of breast cancer   . Family history of colon cancer   . Breast cancer of upper-outer quadrant of left female breast ([place]) 10/11/2014    [place] 09/23/2015, 12:35  PM  [place], [place], [address] Phone: [phone]   Fax:  [phone]  Name: [name] [name] MRN: [id number] Date of Birth: 17-Dec-1960    [name], PT 09/23/2015 12:35 PM

## 2015-09-26 ENCOUNTER — Encounter: Payer: Self-pay | Admitting: Physical Therapy

## 2015-09-26 ENCOUNTER — Ambulatory Visit: Payer: BLUE CROSS/BLUE SHIELD | Admitting: Physical Therapy

## 2015-09-26 DIAGNOSIS — M25611 Stiffness of right shoulder, not elsewhere classified: Secondary | ICD-10-CM

## 2015-09-26 DIAGNOSIS — M25612 Stiffness of left shoulder, not elsewhere classified: Secondary | ICD-10-CM

## 2015-09-26 DIAGNOSIS — M629 Disorder of muscle, unspecified: Secondary | ICD-10-CM

## 2015-09-26 NOTE — Therapy (Signed)
[place], [place], [address] Phone: [phone]   Fax:  [phone]  Physical Therapy Treatment  Patient Details  Name: [name] [name] MRN: [id number] Date of Birth: 06/16/1961 Referring Provider: Harlow Mares   Encounter Date: 09/26/2015      PT End of Session - 09/26/15 0902    Visit Number 20   Number of Visits 25   Date for PT Re-Evaluation 10/17/15   PT Start Time 0805   PT Stop Time 0848   PT Time Calculation (min) 43 min   Activity Tolerance Patient tolerated treatment well   Behavior During Therapy [facility] for tasks assessed/performed      Past Medical History  Diagnosis Date  . Osteopenia   . Neurogenic bladder   . Wears contact lenses   . Family history of breast cancer   . Family history of colon cancer   . Breast cancer of upper-outer quadrant of right female breast ([place]) 10/11/2014  . Breast cancer ([place]) 2016    ER-/PR-/Her2+  . Migraines     takes metoprolol  for migraine headaches    Past Surgical History  Procedure Laterality Date  . Vein ligation      both legs  . Colonoscopy    . Upper gi endoscopy    . Portacath placement Right 10/29/2014    Procedure: INSERTION PORT-A-CATH WITH ULTRASOUND;  Surgeon: Erroll Luna, MD;  Location: [place];  Service: General;  Laterality: Right;  . Breast surgery      rt, breast biopsy  . Nipple sparing mastectomy/sentinal lymph node biopsy/reconstruction/placement of tissue expander Bilateral 05/19/2015    Procedure: BILATERAL NIPPLE SPARING MASTECTOMY WITH LEFT AXILLARY SENTINAL LYMPH NODE BIOPSY;  Surgeon: Erroll Luna, MD;  Location: [place];  Service: General;  Laterality: Bilateral;  . Breast reconstruction with placement of tissue expander and flex hd (acellular hydrated dermis) Bilateral 05/19/2015    Procedure: BILATERAL NIPPLE SPARING MASTECTOMY WITH LEFT AXILLARY SENTINAL LYMPH NODE BIOPSY;  Surgeon: Erroll Luna,  MD;  Location: [place];  Service: General;  Laterality: Bilateral;    There were no vitals filed for this visit.  Visit Diagnosis:  Shoulder stiffness, left  Shoulder stiffness, right  Disorder of muscle, ligament, and fascia      Subjective Assessment - 09/26/15 0808    Subjective My arms are doing ok but they are tight. I really think that physical therapy helps.    Pertinent History Diagnosed 10/03/14 with left ER/PR negative, HER2 positive breast cancer.  She also has a positive left axillary node. Bilateral mastectomy with 2 nodes removed from the left side, none on the right.on Nov. 21 with immedicate silicone implants. Has had chemotherapy and will continue with target therapy till June and will see about radition therapy in  January.     Patient Stated Goals pt wants to get back on the tennis court  by May    Currently in Pain? No/denies   Pain Score 0-No pain                         [facility] Adult PT Treatment/Exercise - 09/26/15 0001    Shoulder Exercises: Pulleys   Flexion 2 minutes  verbal cues for technique   ABduction 2 minutes   Shoulder Exercises: Therapy Ball   Flexion 10 reps  with stretch at end   Manual Therapy   Manual Therapy Scapular mobilization   Myofascial Release left UE myofascial pulling  in supine and then to right sidelying   Scapular Mobilization of left scapula in right sidelying, gentle rhythmic motions into protraction and stretch into depression for upper trap stretch   Passive ROM In supine, right shoulder for IR, ER, abduction, and flexion, with stretch to patient tolerance.; supine over towel roll left horizontal abduction stretch passively.                   Short Term Clinic Goals - 06/13/15 1307    CC Short Term Goal  #1   Title Short term goals = long term goals             Long Term Clinic Goals - 09/26/15 0845    CC Long Term Goal  #1   Title Patient will improve left shoulder flexion range of motion to  150 degrees so reach up in her kitchen cabinets    Baseline 119 on 07/28/15, 09/26/15 - 155 degrees   Status Achieved   CC Long Term Goal  #2   Title Patient will improve right shoulder flexion range of motion to 150  degrees to perform activities of daily living and household chores with greater ease   Baseline 123 on 07/28/15, 09/26/15- 155 degrees   Status Achieved   CC Long Term Goal  #3   Title Patient will improve left shoulder abduction to 150 degrees so that she can wash her hair with greater ease     Baseline 09/26/15- 130 degrees   Status On-going   CC Long Term Goal  #4   Title Patient will improve right shoulder abduction to 150 degrees so that she can wash her hair with greater ease   Baseline 98, 09/26/15- 165 degrees   Status Achieved   CC Long Term Goal  #5   Title Patient will be independent in advanced home exercise program   Status On-going   CC Long Term Goal  #6   Title Pt will be independent with scar massage   Status On-going   CC Long Term Goal  #7   Title Pt will improve left shoulder flexion to 165 degrees to allow pt to reach items on shelves   Baseline 155   Time 4   Period Weeks   Status New   CC Long Term Goal  #8   Title Pt will improve right shoulder flexion to 165 degrees to allow pt to reach items on shelf   Baseline 155   Time 4   Period Weeks   Status New   Additional Goals   Additional Goals Yes            Plan - 09/26/15 0902    Clinical Impression Statement Pt is progressing towards her goals in therapy. Her bilateral shoulder ROM is improving but she is very limited in IR and has difficulty reaching up her back to adjust clothing. She tolerated PROM well this visit.    Pt will benefit from skilled therapeutic intervention in order to improve on the following deficits Decreased range of motion;Postural dysfunction;Increased fascial restricitons;Impaired UE functional use;Decreased activity tolerance;Decreased skin integrity;Decreased scar  mobility;Impaired flexibility;Decreased strength   Clinical Impairments Affecting Rehab Potential continues with target therapy through IV every 3 weeks  Finished radiation treatment on 09/12/2015 .   PT Frequency 2x / week   PT Duration 4 weeks   PT Treatment/Interventions Passive range of motion;Manual techniques;Therapeutic exercise   PT Next Visit Plan add finger ladder, table stretch mod downward dog, supine stretch  over folded pillow to upper back with head supported, add to current HEP as needed, add IR goal   Consulted and Agree with Plan of Care Patient        Problem List Patient Active Problem List   Diagnosis Date Noted  . Allergic dermatitis 06/19/2015  . Nausea without vomiting 12/30/2014  . Diarrhea 12/30/2014  . Dehydration 12/30/2014  . Hypotension 12/30/2014  . 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 External hemorrhoid 12/13/2014  . UTI (urinary tract infection) 11/16/2014  . Genetic testing 11/07/2014  . Migraine headache 11/06/2014  . Family history of breast cancer   . Family history of colon cancer   . Breast cancer of upper-outer quadrant of left female breast ([place]) 10/11/2014    Alexia Freestone 09/26/2015, 9:10 AM  [place], [place], [address] Phone: [phone]   Fax:  [phone]  Name: RYELYNN GUEDEA MRN: [id number] Date of Birth: [birth year]    Allyson Sabal, PT 09/26/2015 9:11 AM

## 2015-09-30 ENCOUNTER — Telehealth: Payer: Self-pay | Admitting: Hematology and Oncology

## 2015-09-30 ENCOUNTER — Ambulatory Visit: Payer: BLUE CROSS/BLUE SHIELD | Attending: Plastic Surgery

## 2015-09-30 DIAGNOSIS — M25611 Stiffness of right shoulder, not elsewhere classified: Secondary | ICD-10-CM | POA: Insufficient documentation

## 2015-09-30 DIAGNOSIS — M25612 Stiffness of left shoulder, not elsewhere classified: Secondary | ICD-10-CM | POA: Insufficient documentation

## 2015-09-30 NOTE — Telephone Encounter (Signed)
Patient called to get clarification on 4/6 appts. Canceled MD visit on 4/6 per Feb pof ov not needed until 4/27. Pt aware of new appt time for 4/6

## 2015-10-01 ENCOUNTER — Ambulatory Visit: Payer: BLUE CROSS/BLUE SHIELD | Admitting: Physical Therapy

## 2015-10-01 DIAGNOSIS — M25612 Stiffness of left shoulder, not elsewhere classified: Secondary | ICD-10-CM | POA: Diagnosis present

## 2015-10-01 DIAGNOSIS — M25611 Stiffness of right shoulder, not elsewhere classified: Secondary | ICD-10-CM | POA: Diagnosis present

## 2015-10-01 NOTE — Therapy (Signed)
[place], [place], [address] Phone: [phone]   Fax:  [phone]  Physical Therapy Treatment  Patient Details  Name: [name] [name] MRN: [id number] Date of Birth: 05-18-1961 Referring Provider: Harlow Mares   Encounter Date: 10/01/2015      PT End of Session - 10/01/15 1419    Visit Number 21   Number of Visits 25   Date for PT Re-Evaluation 10/17/15   PT Start Time 0930   PT Stop Time 1015   PT Time Calculation (min) 45 min   Activity Tolerance Patient tolerated treatment well   Behavior During Therapy [facility] for tasks assessed/performed      Past Medical History  Diagnosis Date  . Osteopenia   . Neurogenic bladder   . Wears contact lenses   . Family history of breast cancer   . Family history of colon cancer   . Breast cancer of upper-outer quadrant of right female breast ([place]) 10/11/2014  . Breast cancer ([place]) 2016    ER-/PR-/Her2+  . Migraines     takes metoprolol  for migraine headaches    Past Surgical History  Procedure Laterality Date  . Vein ligation      both legs  . Colonoscopy    . Upper gi endoscopy    . Portacath placement Right 10/29/2014    Procedure: INSERTION PORT-A-CATH WITH ULTRASOUND;  Surgeon: Erroll Luna, MD;  Location: [place];  Service: General;  Laterality: Right;  . Breast surgery      rt, breast biopsy  . Nipple sparing mastectomy/sentinal lymph node biopsy/reconstruction/placement of tissue expander Bilateral 05/19/2015    Procedure: BILATERAL NIPPLE SPARING MASTECTOMY WITH LEFT AXILLARY SENTINAL LYMPH NODE BIOPSY;  Surgeon: Erroll Luna, MD;  Location: [place];  Service: General;  Laterality: Bilateral;  . Breast reconstruction with placement of tissue expander and flex hd (acellular hydrated dermis) Bilateral 05/19/2015    Procedure: BILATERAL NIPPLE SPARING MASTECTOMY WITH LEFT AXILLARY SENTINAL LYMPH NODE BIOPSY;  Surgeon: Erroll Luna, MD;   Location: [place];  Service: General;  Laterality: Bilateral;    There were no vitals filed for this visit.  Visit Diagnosis:  Shoulder stiffness, left  Shoulder stiffness, right      Subjective Assessment - 10/01/15 0936    Subjective I feel the burning deep inside    Pertinent History Diagnosed 10/03/14 with left ER/PR negative, HER2 positive breast cancer.  She also has a positive left axillary node. Bilateral mastectomy with 2 nodes removed from the left side, none on the right.on Nov. 21 with immedicate silicone implants. Has had chemotherapy and will continue with target therapy till June and will see about radition therapy in  January.     Patient Stated Goals pt wants to get back on the tennis court  by May    Currently in Pain? Yes   Pain Score 3    Pain Location Breast   Pain Orientation Left   Pain Descriptors / Indicators Tingling;Burning   Pain Type Chronic pain                         [facility] Adult PT Treatment/Exercise - 10/01/15 0001    Shoulder Exercises: Prone   Retraction AROM;Both;5 reps   Extension AROM;Both;5 reps   External Rotation AROM;Both;5 reps   Shoulder Exercises: Standing   Other Standing Exercises doorwary shoulder extension stretch   Other Standing Exercises dowel stretch for bilateral shoulder extensioin pulling up  the back and "scratch the back"    Shoulder Exercises: Pulleys   Flexion 2 minutes  cues to deepen stretch into side body    ABduction 2 minutes   Shoulder Exercises: Stretch   Cross Chest Stretch 2 reps   Other Shoulder Stretches abduction stretch to simulate tennis forehand pushing onto yellow ball    Manual Therapy   Manual therapy comments contrac relax to increase range of motion of left shoulder internal rotation.    Joint Mobilization joint traction and gentle glides in all directions    Passive ROM In supine, right shoulder for IR, ER, abduction, and flexion, with stretch to patient tolerance.; supine over towel  roll left horizontal abduction stretch passively.                PT Education - 10/01/15 1418    Education provided Yes   Education Details dowel rod stretching in multiple planes   Person(s) Educated Patient   Methods Explanation;Demonstration   Comprehension Verbalized understanding;Returned demonstration           Short Term Clinic Goals - 06/13/15 1307    CC Short Term Goal  #1   Title Short term goals = long term goals             Long Term Clinic Goals - 09/26/15 0845    CC Long Term Goal  #1   Title Patient will improve left shoulder flexion range of motion to 150 degrees so reach up in her kitchen cabinets    Baseline 119 on 07/28/15, 09/26/15 - 155 degrees   Status Achieved   CC Long Term Goal  #2   Title Patient will improve right shoulder flexion range of motion to 150  degrees to perform activities of daily living and household chores with greater ease   Baseline 123 on 07/28/15, 09/26/15- 155 degrees   Status Achieved   CC Long Term Goal  #3   Title Patient will improve left shoulder abduction to 150 degrees so that she can wash her hair with greater ease     Baseline 09/26/15- 130 degrees   Status On-going   CC Long Term Goal  #4   Title Patient will improve right shoulder abduction to 150 degrees so that she can wash her hair with greater ease   Baseline 98, 09/26/15- 165 degrees   Status Achieved   CC Long Term Goal  #5   Title Patient will be independent in advanced home exercise program   Status On-going   CC Long Term Goal  #6   Title Pt will be independent with scar massage   Status On-going   CC Long Term Goal  #7   Title Pt will improve left shoulder flexion to 165 degrees to allow pt to reach items on shelves   Baseline 155   Time 4   Period Weeks   Status New   CC Long Term Goal  #8   Title Pt will improve right shoulder flexion to 165 degrees to allow pt to reach items on shelf   Baseline 155   Time 4   Period Weeks   Status New    Additional Goals   Additional Goals Yes            Plan - 10/01/15 1419    Clinical Impression Statement Pt continues with tightness in her left chest and is fearful her implant is contracating. She was able to demonstate stretching today and responded to manual therapy to  incrase left shoulder internal rotation. Upgraded home program dowel stretching    Pt will benefit from skilled therapeutic intervention in order to improve on the following deficits Decreased range of motion;Postural dysfunction;Increased fascial restricitons;Impaired UE functional use;Decreased activity tolerance;Decreased skin integrity;Decreased scar mobility;Impaired flexibility;Decreased strength   Clinical Impairments Affecting Rehab Potential continues with target therapy through IV every 3 weeks  Finished radiation treatment on 09/12/2015 .   PT Next Visit Plan add finger ladder, table stretch mod downward dog, supine stretch over folded pillow to upper back with head supported, add to current HEP as needed,    Consulted and Agree with Plan of Care Patient        Problem List Patient Active Problem List   Diagnosis Date Noted  . Allergic dermatitis 06/19/2015  . Nausea without vomiting 12/30/2014  . Diarrhea 12/30/2014  . Dehydration 12/30/2014  . Hypotension 12/30/2014  . 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 External hemorrhoid 12/13/2014  . UTI (urinary tract infection) 11/16/2014  . Genetic testing 11/07/2014  . Migraine headache 11/06/2014  . Family history of breast cancer   . Family history of colon cancer   . Breast cancer of upper-outer quadrant of left female breast ([place]) 10/11/2014   Donato Heinz. Owens Shark, PT  10/01/2015, 2:22 PM  [place], [place], [address] Phone: [phone]   Fax:  [phone]  Name: [name] [name] MRN: [id number] Date of Birth: [birth year]

## 2015-10-02 ENCOUNTER — Other Ambulatory Visit (HOSPITAL_BASED_OUTPATIENT_CLINIC_OR_DEPARTMENT_OTHER): Payer: BLUE CROSS/BLUE SHIELD

## 2015-10-02 ENCOUNTER — Ambulatory Visit: Payer: BLUE CROSS/BLUE SHIELD | Admitting: Hematology and Oncology

## 2015-10-02 ENCOUNTER — Ambulatory Visit (HOSPITAL_BASED_OUTPATIENT_CLINIC_OR_DEPARTMENT_OTHER): Payer: BLUE CROSS/BLUE SHIELD

## 2015-10-02 VITALS — BP 94/64 | HR 67 | Temp 98.6°F | Resp 18

## 2015-10-02 DIAGNOSIS — C50412 Malignant neoplasm of upper-outer quadrant of left female breast: Secondary | ICD-10-CM | POA: Diagnosis not present

## 2015-10-02 DIAGNOSIS — Z5112 Encounter for antineoplastic immunotherapy: Secondary | ICD-10-CM

## 2015-10-02 DIAGNOSIS — D6481 Anemia due to antineoplastic chemotherapy: Secondary | ICD-10-CM

## 2015-10-02 DIAGNOSIS — T451X5A Adverse effect of antineoplastic and immunosuppressive drugs, initial encounter: Principal | ICD-10-CM

## 2015-10-02 LAB — CBC WITH DIFFERENTIAL/PLATELET
BASO%: 0.9 % (ref 0.0–2.0)
BASOS ABS: 0 10*3/uL (ref 0.0–0.1)
EOS%: 2.6 % (ref 0.0–7.0)
Eosinophils Absolute: 0.1 10*3/uL (ref 0.0–0.5)
HCT: 36.4 % (ref 34.8–46.6)
HEMOGLOBIN: 11.5 g/dL — AB (ref 11.6–15.9)
LYMPH%: 18.9 % (ref 14.0–49.7)
MCH: 27.6 pg (ref 25.1–34.0)
MCHC: 31.7 g/dL (ref 31.5–36.0)
MCV: 87.2 fL (ref 79.5–101.0)
MONO#: 0.3 10*3/uL (ref 0.1–0.9)
MONO%: 9.4 % (ref 0.0–14.0)
NEUT%: 68.2 % (ref 38.4–76.8)
NEUTROS ABS: 2.3 10*3/uL (ref 1.5–6.5)
Platelets: 179 10*3/uL (ref 145–400)
RBC: 4.17 10*6/uL (ref 3.70–5.45)
RDW: 15.1 % — AB (ref 11.2–14.5)
WBC: 3.4 10*3/uL — AB (ref 3.9–10.3)
lymph#: 0.6 10*3/uL — ABNORMAL LOW (ref 0.9–3.3)

## 2015-10-02 LAB — COMPREHENSIVE METABOLIC PANEL
ALBUMIN: 3.8 g/dL (ref 3.5–5.0)
ALK PHOS: 84 U/L (ref 40–150)
ALT: 27 U/L (ref 0–55)
AST: 26 U/L (ref 5–34)
Anion Gap: 7 mEq/L (ref 3–11)
BUN: 17.3 mg/dL (ref 7.0–26.0)
CO2: 31 meq/L — AB (ref 22–29)
Calcium: 9.5 mg/dL (ref 8.4–10.4)
Chloride: 104 mEq/L (ref 98–109)
Creatinine: 0.8 mg/dL (ref 0.6–1.1)
EGFR: 83 mL/min/{1.73_m2} — ABNORMAL LOW (ref >90)
GLUCOSE: 108 mg/dL (ref 70–140)
POTASSIUM: 4.3 meq/L (ref 3.5–5.1)
SODIUM: 143 meq/L (ref 136–145)
Total Bilirubin: 0.48 mg/dL (ref 0.20–1.20)
Total Protein: 6.7 g/dL (ref 6.4–8.3)

## 2015-10-02 MED ORDER — DIPHENHYDRAMINE HCL 25 MG PO CAPS: 50.0000 mg | ORAL_CAPSULE | Freq: Once | ORAL | Status: DC

## 2015-10-02 MED ORDER — TRASTUZUMAB CHEMO INJECTION 440 MG
6.0000 mg/kg | Freq: Once | INTRAVENOUS | Status: AC
  Administered 2015-10-02: 294 mg via INTRAVENOUS
  Filled 2015-10-02: qty 14

## 2015-10-02 MED ORDER — ACETAMINOPHEN 325 MG PO TABS: 650.0000 mg | ORAL_TABLET | Freq: Once | ORAL | Status: DC

## 2015-10-02 MED ORDER — SODIUM CHLORIDE 0.9 % IJ SOLN
10.0000 mL | INTRAMUSCULAR | Status: DC | PRN
  Administered 2015-10-02: 10 mL
  Filled 2015-10-02: qty 10

## 2015-10-02 MED ORDER — HEPARIN SOD (PORK) LOCK FLUSH 100 UNIT/ML IV SOLN
500.0000 [IU] | Freq: Once | INTRAVENOUS | Status: AC | PRN
  Administered 2015-10-02: 500 [IU]
  Filled 2015-10-02: qty 5

## 2015-10-02 MED ORDER — SODIUM CHLORIDE 0.9 % IV SOLN
Freq: Once | INTRAVENOUS | Status: AC
  Administered 2015-10-02: 11:00:00 via INTRAVENOUS

## 2015-10-02 NOTE — Patient Instructions (Signed)
[place] Discharge Instructions for Patients Receiving Chemotherapy  Today you received the following chemotherapy agents:  Herceptin  To help prevent nausea and vomiting after your treatment, we encourage you to take your nausea medications as prescribe.   If you develop nausea and vomiting that is not controlled by your nausea medication, call the clinic.   BELOW ARE SYMPTOMS THAT SHOULD BE REPORTED IMMEDIATELY:  *FEVER GREATER THAN 100.5 F  *CHILLS WITH OR WITHOUT FEVER  NAUSEA AND VOMITING THAT IS NOT CONTROLLED WITH YOUR NAUSEA MEDICATION  *UNUSUAL SHORTNESS OF BREATH  *UNUSUAL BRUISING OR BLEEDING  TENDERNESS IN MOUTH AND THROAT WITH OR WITHOUT PRESENCE OF ULCERS  *URINARY PROBLEMS  *BOWEL PROBLEMS  UNUSUAL RASH Items with * indicate a potential emergency and should be followed up as soon as possible.  Feel free to call the clinic you have any questions or concerns. The clinic phone number is [phone].  Please show the [facility] at check-in to the Emergency Department and triage nurse.

## 2015-10-07 ENCOUNTER — Ambulatory Visit: Payer: BLUE CROSS/BLUE SHIELD | Admitting: Physical Therapy

## 2015-10-07 DIAGNOSIS — M25611 Stiffness of right shoulder, not elsewhere classified: Secondary | ICD-10-CM | POA: Diagnosis not present

## 2015-10-07 DIAGNOSIS — M25612 Stiffness of left shoulder, not elsewhere classified: Secondary | ICD-10-CM

## 2015-10-07 NOTE — Therapy (Signed)
[place], [place], [address] Phone: [phone]   Fax:  [phone]  Physical Therapy Treatment  Patient Details  Name: [name] [name] MRN: [id number] Date of Birth: May 17, 1961 Referring Provider: Harlow Mares   Encounter Date: 10/07/2015      PT End of Session - 10/07/15 1747    Visit Number 22   Number of Visits 30   Date for PT Re-Evaluation 11/04/15   Authorization Type April Recert Done    PT Start Time 1515   PT Stop Time 1605   PT Time Calculation (min) 50 min   Activity Tolerance Patient tolerated treatment well   Behavior During Therapy [facility] for tasks assessed/performed      Past Medical History  Diagnosis Date  . Osteopenia   . Neurogenic bladder   . Wears contact lenses   . Family history of breast cancer   . Family history of colon cancer   . Breast cancer of upper-outer quadrant of right female breast ([place]) 10/11/2014  . Breast cancer ([place]) 2016    ER-/PR-/Her2+  . Migraines     takes metoprolol  for migraine headaches    Past Surgical History  Procedure Laterality Date  . Vein ligation      both legs  . Colonoscopy    . Upper gi endoscopy    . Portacath placement Right 10/29/2014    Procedure: INSERTION PORT-A-CATH WITH ULTRASOUND;  Surgeon: Erroll Luna, MD;  Location: [place];  Service: General;  Laterality: Right;  . Breast surgery      rt, breast biopsy  . Nipple sparing mastectomy/sentinal lymph node biopsy/reconstruction/placement of tissue expander Bilateral 05/19/2015    Procedure: BILATERAL NIPPLE SPARING MASTECTOMY WITH LEFT AXILLARY SENTINAL LYMPH NODE BIOPSY;  Surgeon: Erroll Luna, MD;  Location: [place];  Service: General;  Laterality: Bilateral;  . Breast reconstruction with placement of tissue expander and flex hd (acellular hydrated dermis) Bilateral 05/19/2015    Procedure: BILATERAL NIPPLE SPARING MASTECTOMY WITH LEFT AXILLARY SENTINAL LYMPH  NODE BIOPSY;  Surgeon: Erroll Luna, MD;  Location: [place];  Service: General;  Laterality: Bilateral;    There were no vitals filed for this visit.      Subjective Assessment - 10/07/15 1526    Subjective Pt is still feeling spasms behind implant on left side She is still having fatigue.  She is working 20-30 hours at work. She still not doing alot of the lifting that is required.    Pertinent History Diagnosed 10/03/14 with left ER/PR negative, HER2 positive breast cancer.  She also has a positive left axillary node. Bilateral mastectomy with 2 nodes removed from the left side, none on the right.on Nov. 21 with immedicate silicone implants. Has had chemotherapy and will continue with target therapy till June and will see about radition therapy in  January.     Patient Stated Goals pt wants to get back on the tennis court  by May    Currently in Pain? Yes   Pain Score 3    Pain Location Breast   Pain Orientation Left   Pain Descriptors / Indicators Tingling;Burning   Pain Type Chronic pain   Pain Onset 1 to 4 weeks ago                         [facility] Adult PT Treatment/Exercise - 10/07/15 0001    Self-Care   Other Self-Care Comments  pt reports she is independent  in her skin/scar care and that her skin is good    Lumbar Exercises: Aerobic   Stationary Bike 5 minutes at level one to address fatigue   Shoulder Exercises: Standing   Other Standing Exercises doorwary shoulder extension stretch   Other Standing Exercises dowel stretch for bilateral shoulder extensioin pulling up the back and "scratch the back" , flexion and abduction    Shoulder Exercises: Pulleys   Flexion 2 minutes  cues to deepen stretch into side body    Shoulder Exercises: Stretch   Table Stretch - Flexion 1 rep;10 seconds   Table Stretch - Abduction 1 rep;10 seconds   Other Shoulder Stretches supine with folded pillow under upper back and pillow under head with arms out to side to stretch anterior  chest    Manual Therapy   Manual therapy comments contrac relax to increase range of motion of left and right  shoulder internal rotation.    Joint Mobilization joint traction and gentle glides in all directions  tp both shoulders    Myofascial Release to right shoulder with traction and pulling    Passive ROM In supine, right shoulder for IR, ER, abduction, and flexion, with stretch to patient tolerance.                PT Education - 10/07/15 1747    Education provided Yes   Education Details table stretches    Person(s) Educated Patient   Methods Explanation;Demonstration   Comprehension Verbalized understanding;Returned demonstration           Short Term Clinic Goals - 06/13/15 1307    CC Short Term Goal  #1   Title Short term goals = long term goals             Long Term Clinic Goals - 10/07/15 1755    CC Long Term Goal  #1   Title Patient will improve left shoulder flexion range of motion to 150 degrees so reach up in her kitchen cabinets    Baseline 119 on 07/28/15, 09/26/15 - 155 degrees   Status Achieved   CC Long Term Goal  #2   Title Patient will improve right shoulder flexion range of motion to 150  degrees to perform activities of daily living and household chores with greater ease   Baseline 123 on 07/28/15, 09/26/15- 155 degrees   Status Achieved   CC Long Term Goal  #3   Title Patient will improve left shoulder abduction to 150 degrees so that she can wash her hair with greater ease     Baseline 09/26/15- 130 degrees   Time 4   Period Weeks   Status On-going   CC Long Term Goal  #4   Title Patient will improve right shoulder abduction to 150 degrees so that she can wash her hair with greater ease   Baseline 98, 09/26/15- 165 degrees   Time 4   Status On-going   CC Long Term Goal  #5   Title Patient will be independent in advanced home exercise program   Period Weeks   Status On-going   CC Long Term Goal  #6   Title Pt will be independent with  scar massage   Time 4   Period Weeks   Status Achieved   CC Long Term Goal  #7   Title Pt will improve left shoulder flexion to 165 degrees to allow pt to reach items on shelves   Baseline 155   Period Weeks   Status On-going  CC Long Term Goal  #8   Title Pt will improve right shoulder flexion to 165 degrees to allow pt to reach items on shelf   Baseline 155   Time 4   Period Weeks   Status On-going            Plan - 10/07/15 1749    Clinical Impression Statement Pt continues to struggle with tightness in left anterior chest and in both shoulders, especially in internal rotation.  She also has complaints of fatigue.  She is compliant with home exercise and has improved in posture, though still needs occasional verbal cues for correction.  She is tolerating joint mobilization and manual stretching to improve shoulder tightness.    Rehab Potential Excellent   Clinical Impairments Affecting Rehab Potential continues with target therapy through IV every 3 weeks  Finished radiation treatment on 09/12/2015 .   PT Frequency 2x / week   PT Duration 4 weeks   PT Treatment/Interventions Passive range of motion;Manual techniques;Therapeutic exercise;Patient/family education;ADLs/Self Care Home Management   PT Next Visit Plan active and passive stretching to both shoulders with joint moblization and scapular mobility manual work.  continue recumbant bike for fatigue management .   Consulted and Agree with Plan of Care Patient      Patient will benefit from skilled therapeutic intervention in order to improve the following deficits and impairments:  Decreased range of motion, Postural dysfunction, Increased fascial restricitons, Impaired UE functional use, Decreased activity tolerance, Decreased skin integrity, Decreased scar mobility, Impaired flexibility, Decreased strength  Visit Diagnosis: Stiffness of right shoulder, not elsewhere classified - Plan: PT plan of care  cert/re-cert  Stiffness of left shoulder, not elsewhere classified - Plan: PT plan of care cert/re-cert     Problem List Patient Active Problem List   Diagnosis Date Noted  . Allergic dermatitis 06/19/2015  . Nausea without vomiting 12/30/2014  . Diarrhea 12/30/2014  . Dehydration 12/30/2014  . Hypotension 12/30/2014  . 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 External hemorrhoid 12/13/2014  . UTI (urinary tract infection) 11/16/2014  . Genetic testing 11/07/2014  . Migraine headache 11/06/2014  . Family history of breast cancer   . Family history of colon cancer   . Breast cancer of upper-outer quadrant of left female breast ([place]) 10/11/2014   Donato Heinz. Owens Shark, PT 10/07/2015, 6:04 PM  [place], [place], [address] Phone: [phone]   Fax:  [phone]  Name: LUCYANN ROMANO MRN: [id number] Date of Birth: [birth year]

## 2015-10-09 ENCOUNTER — Ambulatory Visit: Payer: BLUE CROSS/BLUE SHIELD

## 2015-10-09 DIAGNOSIS — M25611 Stiffness of right shoulder, not elsewhere classified: Secondary | ICD-10-CM | POA: Diagnosis not present

## 2015-10-09 DIAGNOSIS — M25612 Stiffness of left shoulder, not elsewhere classified: Secondary | ICD-10-CM

## 2015-10-09 NOTE — Therapy (Signed)
[place], [place], [address] Phone: [phone]   Fax:  [phone]  Physical Therapy Treatment  Patient Details  Name: [name] [name] MRN: [id number] Date of Birth: Nov 23, 1960 Referring Provider: Harlow Mares   Encounter Date: 10/09/2015      PT End of Session - 10/09/15 1024    Visit Number 23   Number of Visits 30   Date for PT Re-Evaluation 11/04/15   PT Start Time 0936   PT Stop Time 1022   PT Time Calculation (min) 46 min   Activity Tolerance Patient tolerated treatment well   Behavior During Therapy [facility] for tasks assessed/performed      Past Medical History  Diagnosis Date  . Osteopenia   . Neurogenic bladder   . Wears contact lenses   . Family history of breast cancer   . Family history of colon cancer   . Breast cancer of upper-outer quadrant of right female breast ([place]) 10/11/2014  . Breast cancer ([place]) 2016    ER-/PR-/Her2+  . Migraines     takes metoprolol  for migraine headaches    Past Surgical History  Procedure Laterality Date  . Vein ligation      both legs  . Colonoscopy    . Upper gi endoscopy    . Portacath placement Right 10/29/2014    Procedure: INSERTION PORT-A-CATH WITH ULTRASOUND;  Surgeon: Erroll Luna, MD;  Location: [place];  Service: General;  Laterality: Right;  . Breast surgery      rt, breast biopsy  . Nipple sparing mastectomy/sentinal lymph node biopsy/reconstruction/placement of tissue expander Bilateral 05/19/2015    Procedure: BILATERAL NIPPLE SPARING MASTECTOMY WITH LEFT AXILLARY SENTINAL LYMPH NODE BIOPSY;  Surgeon: Erroll Luna, MD;  Location: [place];  Service: General;  Laterality: Bilateral;  . Breast reconstruction with placement of tissue expander and flex hd (acellular hydrated dermis) Bilateral 05/19/2015    Procedure: BILATERAL NIPPLE SPARING MASTECTOMY WITH LEFT AXILLARY SENTINAL LYMPH NODE BIOPSY;  Surgeon: Erroll Luna,  MD;  Location: [place];  Service: General;  Laterality: Bilateral;    There were no vitals filed for this visit.      Subjective Assessment - 10/09/15 0953    Subjective The fatigue comes and goes, my biggest complaint though is the chest tightness. I'm so frustrated with the tighness, it even woke me up last night and I've been stretching more and startedusing the green theraband in standing with my scapular exercises.   Pertinent History Diagnosed 10/03/14 with left ER/PR negative, HER2 positive breast cancer.  She also has a positive left axillary node. Bilateral mastectomy with 2 nodes removed from the left side, none on the right.on Nov. 21 with immedicate silicone implants. Has had chemotherapy and will continue with target therapy till June and will see about radition therapy in  January.     Patient Stated Goals pt wants to get back on the tennis court  by May    Currently in Pain? Yes   Pain Score 4    Pain Location Breast  and chest   Pain Orientation Right;Left   Pain Descriptors / Indicators Aching;Tightness;Heaviness   Pain Type Chronic pain   Pain Onset More than a month ago   Pain Frequency Constant   Aggravating Factors  pain is constatn today and woke me up last night   Pain Relieving Factors nothing has helped in past 2 days  [facility] Adult PT Treatment/Exercise - 10/09/15 0001    Manual Therapy   Soft tissue mobilization To bil chest wall focusing near bil sternum while supine on towel roll and Lt lateral breast at tight pectoralis   Passive ROM In supine, right shoulder for D2, abduction, and flexion, with stretch to patient tolerance, and same on Rt briefly                   Short Term Clinic Goals - 06/13/15 1307    CC Short Term Goal  #1   Title Short term goals = long term goals             Long Term Clinic Goals - 10/07/15 1755    CC Long Term Goal  #1   Title Patient will improve left shoulder flexion  range of motion to 150 degrees so reach up in her kitchen cabinets    Baseline 119 on 07/28/15, 09/26/15 - 155 degrees   Status Achieved   CC Long Term Goal  #2   Title Patient will improve right shoulder flexion range of motion to 150  degrees to perform activities of daily living and household chores with greater ease   Baseline 123 on 07/28/15, 09/26/15- 155 degrees   Status Achieved   CC Long Term Goal  #3   Title Patient will improve left shoulder abduction to 150 degrees so that she can wash her hair with greater ease     Baseline 09/26/15- 130 degrees   Time 4   Period Weeks   Status On-going   CC Long Term Goal  #4   Title Patient will improve right shoulder abduction to 150 degrees so that she can wash her hair with greater ease   Baseline 98, 09/26/15- 165 degrees   Time 4   Status On-going   CC Long Term Goal  #5   Title Patient will be independent in advanced home exercise program   Period Weeks   Status On-going   CC Long Term Goal  #6   Title Pt will be independent with scar massage   Time 4   Period Weeks   Status Achieved   CC Long Term Goal  #7   Title Pt will improve left shoulder flexion to 165 degrees to allow pt to reach items on shelves   Baseline 155   Period Weeks   Status On-going   CC Long Term Goal  #8   Title Pt will improve right shoulder flexion to 165 degrees to allow pt to reach items on shelf   Baseline 155   Time 4   Period Weeks   Status On-going            Plan - 10/09/15 1024    Clinical Impression Statement Pt came in frustrated about her chest tightness that it even wokeher up last night so brought [employer], PT in and after discussing with pt decided to have pt hold off on theraband exercises for now and just do gentle stretching next few days then start back with yellow band gently and in supine. Pt agreed.    Rehab Potential Excellent   Clinical Impairments Affecting Rehab Potential continues with target therapy through IV  every 3 weeks  Finished radiation treatment on 09/12/2015 .   PT Frequency 2x / week   PT Duration 4 weeks   PT Treatment/Interventions Passive range of motion;Manual techniques;Therapeutic exercise;Patient/family education;ADLs/Self Care Home Management   PT Next Visit Plan Assess how pt  felt after todays treatment of soft tissue and how backing off exercises for a few days did for her chest tightness. Cont manual therapy focusing on decreasing chest tightness.    Consulted and Agree with Plan of Care Patient      Patient will benefit from skilled therapeutic intervention in order to improve the following deficits and impairments:  Decreased range of motion, Postural dysfunction, Increased fascial restricitons, Impaired UE functional use, Decreased activity tolerance, Decreased skin integrity, Decreased scar mobility, Impaired flexibility, Decreased strength  Visit Diagnosis: Stiffness of right shoulder, not elsewhere classified  Stiffness of left shoulder, not elsewhere classified     Problem List Patient Active Problem List   Diagnosis Date Noted  . Allergic dermatitis 06/19/2015  . Nausea without vomiting 12/30/2014  . Diarrhea 12/30/2014  . Dehydration 12/30/2014  . Hypotension 12/30/2014  . 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 External hemorrhoid 12/13/2014  . UTI (urinary tract infection) 11/16/2014  . Genetic testing 11/07/2014  . Migraine headache 11/06/2014  . Family history of breast cancer   . Family history of colon cancer   . Breast cancer of upper-outer quadrant of left female breast ([place]) 10/11/2014    Otelia Limes, PTA 10/09/2015, 11:07 AM  [place], [place], [address] Phone: [phone]   Fax:  [phone]  Name: [name] [name] MRN: [id number] Date of Birth: Sep 20, 1960

## 2015-10-14 ENCOUNTER — Ambulatory Visit: Payer: BLUE CROSS/BLUE SHIELD | Admitting: Physical Therapy

## 2015-10-14 DIAGNOSIS — M25611 Stiffness of right shoulder, not elsewhere classified: Secondary | ICD-10-CM

## 2015-10-14 DIAGNOSIS — M25612 Stiffness of left shoulder, not elsewhere classified: Secondary | ICD-10-CM

## 2015-10-14 NOTE — Therapy (Signed)
[place], [place], [address] Phone: [phone]   Fax:  [phone]  Physical Therapy Treatment  Patient Details  Name: [name] [name] MRN: [id number] Date of Birth: 13-Jan-1961 Referring Provider: Harlow Mares   Encounter Date: 10/14/2015      PT End of Session - 10/14/15 1229    Visit Number 24   Number of Visits 30   Date for PT Re-Evaluation 11/04/15   Authorization Type April Recert Done    PT Start Time 1027   PT Stop Time 1108   PT Time Calculation (min) 41 min   Activity Tolerance Patient tolerated treatment well   Behavior During Therapy [facility] for tasks assessed/performed      Past Medical History  Diagnosis Date  . Osteopenia   . Neurogenic bladder   . Wears contact lenses   . Family history of breast cancer   . Family history of colon cancer   . Breast cancer of upper-outer quadrant of right female breast ([place]) 10/11/2014  . Breast cancer ([place]) 2016    ER-/PR-/Her2+  . Migraines     takes metoprolol  for migraine headaches    Past Surgical History  Procedure Laterality Date  . Vein ligation      both legs  . Colonoscopy    . Upper gi endoscopy    . Portacath placement Right 10/29/2014    Procedure: INSERTION PORT-A-CATH WITH ULTRASOUND;  Surgeon: Erroll Luna, MD;  Location: [place];  Service: General;  Laterality: Right;  . Breast surgery      rt, breast biopsy  . Nipple sparing mastectomy/sentinal lymph node biopsy/reconstruction/placement of tissue expander Bilateral 05/19/2015    Procedure: BILATERAL NIPPLE SPARING MASTECTOMY WITH LEFT AXILLARY SENTINAL LYMPH NODE BIOPSY;  Surgeon: Erroll Luna, MD;  Location: [place];  Service: General;  Laterality: Bilateral;  . Breast reconstruction with placement of tissue expander and flex hd (acellular hydrated dermis) Bilateral 05/19/2015    Procedure: BILATERAL NIPPLE SPARING MASTECTOMY WITH LEFT AXILLARY SENTINAL LYMPH  NODE BIOPSY;  Surgeon: Erroll Luna, MD;  Location: [place];  Service: General;  Laterality: Bilateral;    There were no vitals filed for this visit.      Subjective Assessment - 10/14/15 1031    Subjective "I feel much better "  She is still having tightness but not as fequent as it was last week     Pertinent History Diagnosed 10/03/14 with left ER/PR negative, HER2 positive breast cancer.  She also has a positive left axillary node. Bilateral mastectomy with 2 nodes removed from the left side, none on the right.on Nov. 21 with immedicate silicone implants. Has had chemotherapy and will continue with target therapy till June and will see about radition therapy in  January.     Patient Stated Goals pt wants to get back on the tennis court  by May    Currently in Pain? Yes   Pain Score 2    Pain Location Breast   Pain Orientation Right;Left   Pain Descriptors / Indicators Aching            [facility] PT Assessment - 10/14/15 0001    AROM   Right Shoulder Flexion 147 Degrees   Right Shoulder ABduction 160 Degrees   Right Shoulder Internal Rotation 40 Degrees   Right Shoulder External Rotation 85 Degrees   Left Shoulder Flexion 147 Degrees   Left Shoulder ABduction 152 Degrees   Left Shoulder Internal Rotation 60 Degrees  Left Shoulder External Rotation 85 Degrees                     [facility] Adult PT Treatment/Exercise - 10/14/15 0001    Lumbar Exercises: Supine   Bridge 15 reps   Bridge Limitations 5 reps with both legs, 5 reps each with each leg extendedn   Other Supine Lumbar Exercises lower trunk rotations.    Other Supine Lumbar Exercises "bicycle' crunch with wide coathanger arms,  to avoide neck and upper back felxion ,with cues to move slowly.    Manual Therapy   Joint Mobilization joint traction and gentle glides in all directions  tp both shoulders    Soft tissue mobilization To bil chest wall focusing near bil sternum while supine on towel roll and Lt lateral  breast at tight pectoralis   Passive ROM In supine, right shoulder for D2, abduction, and flexion, with stretch to patient tolerance, and same on Rt briefly                   Short Term Clinic Goals - 06/13/15 1307    CC Short Term Goal  #1   Title Short term goals = long term goals             Long Term Clinic Goals - 10/14/15 1247    CC Long Term Goal  #1   Title Patient will improve left shoulder flexion range of motion to 150 degrees so reach up in her kitchen cabinets    Baseline 119 on 07/28/15, 09/26/15 - 155 degrees   Status Achieved   CC Long Term Goal  #2   Title Patient will improve right shoulder flexion range of motion to 150  degrees to perform activities of daily living and household chores with greater ease   Baseline 123 on 07/28/15, 09/26/15- 155 degrees   Status Achieved   CC Long Term Goal  #4   Title Patient will improve right shoulder abduction to 150 degrees so that she can wash her hair with greater ease   Baseline 98, 09/26/15- 165 degrees    Status Achieved   CC Long Term Goal  #5   Title Patient will be independent in advanced home exercise program   Status On-going   CC Long Term Goal  #6   Title Pt will be independent with scar massage   Status Achieved   CC Long Term Goal  #7   Title Pt will improve left shoulder flexion to 165 degrees to allow pt to reach items on shelves   Baseline 152 on 10/14/2015   Status On-going   CC Long Term Goal  #8   Title Pt will improve right shoulder flexion to 165 degrees to allow pt to reach items on shelf   Baseline 150 on 10/14/2015   Status On-going            Plan - 10/14/15 1241    Clinical Impression Statement Pt continues to have tightness, but less than last week.  Encouraged her to continue with stretching, core exercise and walking.  She played tennis for about 20 minutes and had fatigue and soreness in right arm afteward.  Pt has improved shoulder range of motion and feel she will continue  to improve with fitness exercise.  Encouraged her to go to [place] program soon.  Pt concerned about her PT visit limiit so will decrease to one time per week and she will continue with home exercise  Rehab Potential Excellent   Clinical Impairments Affecting Rehab Potential continues with target therapy through IV every 3 weeks  Finished radiation treatment on 09/12/2015 .   PT Treatment/Interventions Passive range of motion;Manual techniques;Therapeutic exercise;Patient/family education;ADLs/Self Care Home Management   PT Next Visit Plan  Cont manual therapy focusing on decreasing chest tightness and progressing toward tennis   Consulted and Agree with Plan of Care Patient      Patient will benefit from skilled therapeutic intervention in order to improve the following deficits and impairments:  Decreased range of motion, Postural dysfunction, Increased fascial restricitons, Impaired UE functional use, Decreased activity tolerance, Decreased skin integrity, Decreased scar mobility, Impaired flexibility, Decreased strength  Visit Diagnosis: Stiffness of right shoulder, not elsewhere classified  Stiffness of left shoulder, not elsewhere classified     Problem List Patient Active Problem List   Diagnosis Date Noted  . Allergic dermatitis 06/19/2015  . Nausea without vomiting 12/30/2014  . Diarrhea 12/30/2014  . Dehydration 12/30/2014  . Hypotension 12/30/2014  . 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 External hemorrhoid 12/13/2014  . UTI (urinary tract infection) 11/16/2014  . Genetic testing 11/07/2014  . Migraine headache 11/06/2014  . Family history of breast cancer   . Family history of colon cancer   . Breast cancer of upper-outer quadrant of left female breast ([place]) 10/11/2014   Donato Heinz. Owens Shark, PT    10/14/2015, 12:50 PM  [place], [place], [address] Phone:  [phone]   Fax:  [phone]  Name: [name] [name] MRN: [id number] Date of Birth: [birth year]

## 2015-10-16 ENCOUNTER — Encounter: Payer: BLUE CROSS/BLUE SHIELD | Admitting: Physical Therapy

## 2015-10-21 ENCOUNTER — Encounter: Payer: BLUE CROSS/BLUE SHIELD | Admitting: Physical Therapy

## 2015-10-22 ENCOUNTER — Other Ambulatory Visit: Payer: Self-pay

## 2015-10-23 ENCOUNTER — Encounter: Payer: Self-pay | Admitting: *Deleted

## 2015-10-23 ENCOUNTER — Telehealth: Payer: Self-pay | Admitting: Hematology and Oncology

## 2015-10-23 ENCOUNTER — Other Ambulatory Visit (HOSPITAL_BASED_OUTPATIENT_CLINIC_OR_DEPARTMENT_OTHER): Payer: BLUE CROSS/BLUE SHIELD

## 2015-10-23 ENCOUNTER — Ambulatory Visit (HOSPITAL_BASED_OUTPATIENT_CLINIC_OR_DEPARTMENT_OTHER): Payer: BLUE CROSS/BLUE SHIELD | Admitting: Hematology and Oncology

## 2015-10-23 ENCOUNTER — Encounter: Payer: Self-pay | Admitting: Hematology and Oncology

## 2015-10-23 ENCOUNTER — Ambulatory Visit (HOSPITAL_BASED_OUTPATIENT_CLINIC_OR_DEPARTMENT_OTHER): Payer: BLUE CROSS/BLUE SHIELD

## 2015-10-23 VITALS — BP 84/41 | HR 66 | Temp 97.8°F | Resp 18 | Ht 60.5 in | Wt 110.7 lb

## 2015-10-23 DIAGNOSIS — Z5112 Encounter for antineoplastic immunotherapy: Secondary | ICD-10-CM

## 2015-10-23 DIAGNOSIS — Z171 Estrogen receptor negative status [ER-]: Secondary | ICD-10-CM

## 2015-10-23 DIAGNOSIS — C50412 Malignant neoplasm of upper-outer quadrant of left female breast: Secondary | ICD-10-CM

## 2015-10-23 DIAGNOSIS — D6481 Anemia due to antineoplastic chemotherapy: Secondary | ICD-10-CM | POA: Diagnosis not present

## 2015-10-23 DIAGNOSIS — T451X5A Adverse effect of antineoplastic and immunosuppressive drugs, initial encounter: Principal | ICD-10-CM

## 2015-10-23 LAB — CBC WITH DIFFERENTIAL/PLATELET
BASO%: 0.8 % (ref 0.0–2.0)
Basophils Absolute: 0 10*3/uL (ref 0.0–0.1)
EOS ABS: 0.1 10*3/uL (ref 0.0–0.5)
EOS%: 2.8 % (ref 0.0–7.0)
HCT: 35.6 % (ref 34.8–46.6)
HGB: 11.7 g/dL (ref 11.6–15.9)
LYMPH%: 19.9 % (ref 14.0–49.7)
MCH: 27.9 pg (ref 25.1–34.0)
MCHC: 32.7 g/dL (ref 31.5–36.0)
MCV: 85.1 fL (ref 79.5–101.0)
MONO#: 0.4 10*3/uL (ref 0.1–0.9)
MONO%: 8.8 % (ref 0.0–14.0)
NEUT%: 67.7 % (ref 38.4–76.8)
NEUTROS ABS: 2.7 10*3/uL (ref 1.5–6.5)
PLATELETS: 186 10*3/uL (ref 145–400)
RBC: 4.18 10*6/uL (ref 3.70–5.45)
RDW: 14.2 % (ref 11.2–14.5)
WBC: 4 10*3/uL (ref 3.9–10.3)
lymph#: 0.8 10*3/uL — ABNORMAL LOW (ref 0.9–3.3)

## 2015-10-23 LAB — COMPREHENSIVE METABOLIC PANEL
ALT: 37 U/L (ref 0–55)
ANION GAP: 8 meq/L (ref 3–11)
AST: 39 U/L — ABNORMAL HIGH (ref 5–34)
Albumin: 3.8 g/dL (ref 3.5–5.0)
Alkaline Phosphatase: 101 U/L (ref 40–150)
BUN: 19.6 mg/dL (ref 7.0–26.0)
CHLORIDE: 104 meq/L (ref 98–109)
CO2: 29 meq/L (ref 22–29)
Calcium: 9.5 mg/dL (ref 8.4–10.4)
Creatinine: 0.8 mg/dL (ref 0.6–1.1)
EGFR: 84 mL/min/{1.73_m2} — AB (ref >90)
GLUCOSE: 73 mg/dL (ref 70–140)
POTASSIUM: 4 meq/L (ref 3.5–5.1)
SODIUM: 141 meq/L (ref 136–145)
Total Bilirubin: 0.46 mg/dL (ref 0.20–1.20)
Total Protein: 6.6 g/dL (ref 6.4–8.3)

## 2015-10-23 MED ORDER — TRASTUZUMAB CHEMO INJECTION 440 MG
6.0000 mg/kg | Freq: Once | INTRAVENOUS | Status: AC
  Administered 2015-10-23: 294 mg via INTRAVENOUS
  Filled 2015-10-23: qty 14

## 2015-10-23 MED ORDER — DIPHENHYDRAMINE HCL 25 MG PO CAPS: 50.0000 mg | ORAL_CAPSULE | Freq: Once | ORAL | Status: DC

## 2015-10-23 MED ORDER — HEPARIN SOD (PORK) LOCK FLUSH 100 UNIT/ML IV SOLN
500.0000 [IU] | Freq: Once | INTRAVENOUS | Status: AC | PRN
  Administered 2015-10-23: 500 [IU]
  Filled 2015-10-23: qty 5

## 2015-10-23 MED ORDER — ACETAMINOPHEN 325 MG PO TABS: 650.0000 mg | ORAL_TABLET | Freq: Once | ORAL | Status: DC

## 2015-10-23 MED ORDER — SODIUM CHLORIDE 0.9 % IV SOLN
Freq: Once | INTRAVENOUS | Status: AC
  Administered 2015-10-23: 10:00:00 via INTRAVENOUS

## 2015-10-23 MED ORDER — SODIUM CHLORIDE 0.9 % IJ SOLN
10.0000 mL | INTRAMUSCULAR | Status: DC | PRN
  Administered 2015-10-23: 10 mL
  Filled 2015-10-23: qty 10

## 2015-10-23 NOTE — Patient Instructions (Signed)
[facility] Discharge Instructions for Patients Receiving Chemotherapy  Today you received the following chemotherapy agents Herceptin  To help prevent nausea and vomiting after your treatment, we encourage you to take your nausea medication    If you develop nausea and vomiting that is not controlled by your nausea medication, call the clinic.   BELOW ARE SYMPTOMS THAT SHOULD BE REPORTED IMMEDIATELY:  *FEVER GREATER THAN 100.5 F  *CHILLS WITH OR WITHOUT FEVER  NAUSEA AND VOMITING THAT IS NOT CONTROLLED WITH YOUR NAUSEA MEDICATION  *UNUSUAL SHORTNESS OF BREATH  *UNUSUAL BRUISING OR BLEEDING  TENDERNESS IN MOUTH AND THROAT WITH OR WITHOUT PRESENCE OF ULCERS  *URINARY PROBLEMS  *BOWEL PROBLEMS  UNUSUAL RASH Items with * indicate a potential emergency and should be followed up as soon as possible.  Feel free to call the clinic you have any questions or concerns. The clinic phone number is [phone].  Please show the CHEMO ALERT CARD at check-in to the Emergency Department and triage nurse.   

## 2015-10-23 NOTE — Progress Notes (Signed)
Unable to get in to exam room prior to MD.  No assessment performed.  

## 2015-10-23 NOTE — Telephone Encounter (Signed)
spoke w/ husband confirmed SCP appt

## 2015-10-23 NOTE — Telephone Encounter (Signed)
appt made and avs printed °

## 2015-10-23 NOTE — Assessment & Plan Note (Signed)
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:00 position, left axillary lymph node palpable, ER/PR negative, 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 5.32, T1 cN1 M0 stage II a clinical stage Treatment summary: Neo adjuvant chemotherapy with [facility] for general every 3 weeks 6 cycles started 11/01/2014 completed 02/14/2015 Post-neo-adjuvant breast MRI 02/17/2015: No residual mass or abnormal enhancement, no enlarged lymph nodes Post-neo-adjuvant CT chest 02/18/2015: Interval resolution of the left breast mass and multiple enlarged left axillary and subpectoral lymph nodes Bilateral mastectomies 05/19/2015 Left: Radial scar, FA, UDH no malignancy 0/1 lymph node negative; right: FA, radial scar, UDH no malignancy (pathologic complete response) [id number] ---------------------------------------------------------------------------------------------------------------------------------------------------- Current treatment: Herceptin maintenance to complete April 2017 Adjuvant radiation therapy 08/06/2015- 09/12/15  Chemotherapy-induced anemia: monitoring hemoglobin 11.1 Today's last dose of Herceptin treatment.  Return to clinic in 6 months for surveillance and follow-up

## 2015-10-23 NOTE — Progress Notes (Signed)
Patient Care Team: Laray Anger, MD as PCP - General (Obstetrics and Gynecology) Erroll Luna, MD as Consulting Physician (General Surgery) Nicholas Lose, MD as Consulting Physician (Hematology and Oncology) Gery Pray, MD as Consulting Physician (Radiation Oncology) Mauro Kaufmann, RN as Registered Nurse Rockwell Germany, RN as Registered Nurse  DIAGNOSIS: Breast cancer of upper-outer quadrant of left female breast [facility])   Staging form: Breast, AJCC 7th Edition     Clinical stage from 10/16/2014: Stage IIA (T1c, N1, M0) - Unsigned     Pathologic stage from 05/21/2015: yT0, N0, cM0 - Unsigned       Staging comments: Staged on surgical specimen by Dr. Lyndon Code  SUMMARY OF ONCOLOGIC HISTORY:   Breast cancer of upper-outer quadrant of left female breast ([name])   10/01/2014 Mammogram Left breast mammogram and ultrasound for palpable left breast mass which was a maxillary mass with an ill-defined abnormality by ultrasound 1.7 cm at 2:00   10/11/2014 Initial Diagnosis Left breast invasive ductal carcinoma, grade 3, ER/PR negative, 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 5.32, axillary lymph node also positive for cancer   10/30/2014 Imaging CT chest abdomen pelvis and bone scan revealed no evidence of distant metastatic disease but enlarged left axillary and left subpectoral lymph nodes   11/01/2014 - 02/14/2015 Neo-Adjuvant Chemotherapy Neoadjuvant [facility] to 3 weeks 6 cycles followed by Herceptin maintenance completed 10/23/2015   02/17/2015 Breast MRI No residual mass or abnormal enhancement, no enlarged lymph nodes   05/19/2015 Surgery Bilateral mastectomies with immediate reconstruction Left: Radial scar, FA, UDH no malignancy 0/1 lymph node negative; right: FA, radial scar, UV H no malignancy [id number]   08/06/2015 - 09/12/2015 Radiation Therapy Adjuvant XRT    CHIEF COMPLIANT: Last dose of Herceptin  INTERVAL HISTORY: [name] [name] is a 55 year old with above-mentioned history left breast cancer currently on  Herceptin maintenance therapy. Today's last dose of Herceptin. She is very excited about it. She continues to have discomfort in the left breast related to prior radiation therapy. It is not constant. She complains of fatigue as well. She is now working full-time. She also has some multiple family issues including taking care of for husband's elderly parents.  REVIEW OF SYSTEMS:   Constitutional: Denies fevers, chills or abnormal weight loss Eyes: Denies blurriness of vision Ears, nose, mouth, throat, and face: Denies mucositis or sore throat Respiratory: Denies cough, dyspnea or wheezes Cardiovascular: Denies palpitation, chest discomfort Gastrointestinal:  Denies nausea, heartburn or change in bowel habits Skin: Denies abnormal skin rashes Lymphatics: Denies new lymphadenopathy or easy bruising Neurological:Denies numbness, tingling or new weaknesses Behavioral/Psych: Mood is stable, no new changes  Extremities: No lower extremity edema Breast: Left breast discomfort related to the implants. All other systems were reviewed with the patient and are negative.  I have reviewed the past medical history, past surgical history, social history and family history with the patient and they are unchanged from previous note.  ALLERGIES:  is allergic to meperidine; stadol; zofran; and sulfa antibiotics.  MEDICATIONS:  Current Outpatient Prescriptions  Medication Sig Dispense Refill  . atenolol (TENORMIN) 25 MG tablet Take 75 mg by mouth daily. Reported on 10/23/2015    . B Complex-C (B-COMPLEX WITH VITAMIN C) tablet Take 1 tablet by mouth daily.    . betamethasone valerate (VALISONE) 0.1 % cream Apply topically. Reported on 09/09/2015    . Calcium Carbonate (CALCIUM 600 PO) Take 1 tablet by mouth daily.     . cholecalciferol (VITAMIN D) 1000 UNITS tablet Take 1,000 Units  by mouth daily.    . Diclofenac Potassium 50 MG PACK Take 50 mg by mouth. Reported on 09/09/2015    . docusate sodium (COLACE) 100  MG capsule Take 100 mg by mouth 2 (two) times daily as needed for mild constipation. Reported on 09/09/2015    . eletriptan (RELPAX) 40 MG tablet Take 1 tablet (40 mg total) by mouth every 2 (two) hours as needed for migraine or headache (Max 2 per day). 10 tablet 3  . hyaluronate sodium (RADIAPLEXRX) GEL Apply 1 application topically 2 (two) times daily.    . hydrocortisone 2.5 % ointment Apply topically 2 (two) times daily. 30 g 0  . Lactobacillus (ACIDOPHILUS PO) Take 1 capsule by mouth daily.     Marland Kitchen lidocaine-prilocaine (EMLA) cream Apply to affected area once (Patient taking differently: Apply 1 application topically as needed. Apply to affected area once) 30 g 3  . PARoxetine (PAXIL) 20 MG tablet Take 20 mg by mouth.    Marland Kitchen acetaminophen (TYLENOL) 325 MG tablet Take 650 mg by mouth every 6 (six) hours as needed for mild pain. Reported on 7/56/4332    . non-metallic deodorant Jethro Poling) MISC Apply 1 application topically daily as needed. Reported on 10/23/2015    . promethazine (PHENERGAN) 12.5 MG tablet Take 1 tablet (12.5 mg total) by mouth every 6 (six) hours as needed for nausea or vomiting. (Patient not taking: Reported on 09/09/2015) 90 tablet 3   No current facility-administered medications for this visit.   Facility-Administered Medications Ordered in Other Visits  Medication Dose Route Frequency Provider Last Rate Last Dose  . 0.9 %  sodium chloride infusion   Intravenous Once Nicholas Lose, MD      . acetaminophen (TYLENOL) tablet 650 mg  650 mg Oral Once Nicholas Lose, MD      . diphenhydrAMINE (BENADRYL) capsule 50 mg  50 mg Oral Once Nicholas Lose, MD      . heparin lock flush 100 unit/mL  500 Units Intracatheter Once PRN Nicholas Lose, MD      . sodium chloride 0.9 % injection 10 mL  10 mL Intracatheter PRN Nicholas Lose, MD      . trastuzumab (HERCEPTIN) 294 mg in sodium chloride 0.9 % 250 mL chemo infusion  6 mg/kg (Treatment Plan Actual) Intravenous Once Nicholas Lose, MD        PHYSICAL  EXAMINATION: ECOG PERFORMANCE STATUS: 1 - Symptomatic but completely ambulatory  Filed Vitals:   10/23/15 0839  BP: 84/41  Pulse: 66  Temp: 97.8 F (36.6 C)  Resp: 18   Filed Weights   10/23/15 0839  Weight: 110 lb 11.2 oz (50.213 kg)    GENERAL:alert, no distress and comfortable SKIN: skin color, texture, turgor are normal, no rashes or significant lesions EYES: normal, Conjunctiva are pink and non-injected, sclera clear OROPHARYNX:no exudate, no erythema and lips, buccal mucosa, and tongue normal  NECK: supple, thyroid normal size, non-tender, without nodularity LYMPH:  no palpable lymphadenopathy in the cervical, axillary or inguinal LUNGS: clear to auscultation and percussion with normal breathing effort HEART: regular rate & rhythm and no murmurs and no lower extremity edema ABDOMEN:abdomen soft, non-tender and normal bowel sounds MUSCULOSKELETAL:no cyanosis of digits and no clubbing  NEURO: alert & oriented x 3 with fluent speech, no focal motor/sensory deficits EXTREMITIES: No lower extremity edema  LABORATORY DATA:  I have reviewed the data as listed   Chemistry      Component Value Date/Time   NA 141 10/23/2015 0826  NA 130* 05/20/2015 0300   K 4.0 10/23/2015 0826   K 3.3* 05/20/2015 0300   CL 98* 05/20/2015 0300   CO2 29 10/23/2015 0826   CO2 25 05/20/2015 0300   BUN 19.6 10/23/2015 0826   BUN 9 05/20/2015 0300   CREATININE 0.8 10/23/2015 0826   CREATININE 0.80 05/20/2015 0300      Component Value Date/Time   CALCIUM 9.5 10/23/2015 0826   CALCIUM 7.8* 05/20/2015 0300   ALKPHOS 101 10/23/2015 0826   ALKPHOS 58 05/20/2015 0300   AST 39* 10/23/2015 0826   AST 37 05/20/2015 0300   ALT 37 10/23/2015 0826   ALT 19 05/20/2015 0300   BILITOT 0.46 10/23/2015 0826   BILITOT 0.5 05/20/2015 0300     Lab Results  Component Value Date   WBC 4.0 10/23/2015   HGB 11.7 10/23/2015   HCT 35.6 10/23/2015   MCV 85.1 10/23/2015   PLT 186 10/23/2015   NEUTROABS  2.7 10/23/2015   ASSESSMENT & PLAN:  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:00 position, left axillary lymph node palpable, ER/PR negative, 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 5.32, T1 cN1 M0 stage II a clinical stage Treatment summary: Neo adjuvant chemotherapy with [place] for general every 3 weeks 6 cycles started 11/01/2014 completed 02/14/2015 Post-neo-adjuvant breast MRI 02/17/2015: No residual mass or abnormal enhancement, no enlarged lymph nodes Post-neo-adjuvant CT chest 02/18/2015: Interval resolution of the left breast mass and multiple enlarged left axillary and subpectoral lymph nodes Bilateral mastectomies 05/19/2015 Left: Radial scar, FA, UDH no malignancy 0/1 lymph node negative; right: FA, radial scar, UDH no malignancy (pathologic complete response) [id number] ---------------------------------------------------------------------------------------------------------------------------------------------------- Current treatment: Herceptin maintenance to complete April 2017 Adjuvant radiation therapy 08/06/2015- 09/12/15  Chemotherapy-induced anemia: monitoring hemoglobin 11.7 Today's last dose of Herceptin treatment. We will plan on repeating CT scan of the chest to evaluate the pectoral nodes in approximately one year  Return to clinic in 6 months for surveillance and follow-up  No orders of the defined types were placed in this encounter.   The patient has a good understanding of the overall plan. she agrees with it. she will call with any problems that may develop before the next visit here.   Rulon Eisenmenger, MD 10/23/2015

## 2015-10-24 ENCOUNTER — Ambulatory Visit: Payer: BLUE CROSS/BLUE SHIELD | Admitting: Physical Therapy

## 2015-10-27 ENCOUNTER — Encounter: Payer: Self-pay | Admitting: Oncology

## 2015-10-27 ENCOUNTER — Ambulatory Visit: Payer: BLUE CROSS/BLUE SHIELD | Attending: Plastic Surgery

## 2015-10-27 DIAGNOSIS — M25611 Stiffness of right shoulder, not elsewhere classified: Secondary | ICD-10-CM

## 2015-10-27 DIAGNOSIS — M25612 Stiffness of left shoulder, not elsewhere classified: Secondary | ICD-10-CM | POA: Diagnosis present

## 2015-10-27 NOTE — Therapy (Addendum)
[place], [place], [address] Phone: [phone]   Fax:  [phone]  Physical Therapy Treatment  Patient Details  Name: [name] [name] MRN: [id number] Date of Birth: 30-Jun-1960 Referring Provider: Harlow Mares   Encounter Date: 10/27/2015      PT End of Session - 10/27/15 0933    Visit Number 25   Number of Visits 30   Date for PT Re-Evaluation 11/04/15   PT Start Time 0847   PT Stop Time 0933   PT Time Calculation (min) 46 min   Activity Tolerance Patient tolerated treatment well   Behavior During Therapy [facility] for tasks assessed/performed      Past Medical History  Diagnosis Date  . Osteopenia   . Neurogenic bladder   . Wears contact lenses   . Family history of breast cancer   . Family history of colon cancer   . Breast cancer of upper-outer quadrant of right female breast ([place]) 10/11/2014  . Breast cancer ([place]) 2016    ER-/PR-/Her2+  . Migraines     takes metoprolol  for migraine headaches  . Radiation 08/06/15-09/12/15    left chest wall axillary and supraclavicular region 45 gray    Past Surgical History  Procedure Laterality Date  . Vein ligation      both legs  . Colonoscopy    . Upper gi endoscopy    . Portacath placement Right 10/29/2014    Procedure: INSERTION PORT-A-CATH WITH ULTRASOUND;  Surgeon: Erroll Luna, MD;  Location: [facility];  Service: General;  Laterality: Right;  . Breast surgery      rt, breast biopsy  . Nipple sparing mastectomy/sentinal lymph node biopsy/reconstruction/placement of tissue expander Bilateral 05/19/2015    Procedure: BILATERAL NIPPLE SPARING MASTECTOMY WITH LEFT AXILLARY SENTINAL LYMPH NODE BIOPSY;  Surgeon: Erroll Luna, MD;  Location: [place];  Service: General;  Laterality: Bilateral;  . Breast reconstruction with placement of tissue expander and flex hd (acellular hydrated dermis) Bilateral 05/19/2015    Procedure: BILATERAL NIPPLE  SPARING MASTECTOMY WITH LEFT AXILLARY SENTINAL LYMPH NODE BIOPSY;  Surgeon: Erroll Luna, MD;  Location: [place];  Service: General;  Laterality: Bilateral;    There were no vitals filed for this visit.      Subjective Assessment - 10/27/15 0851    Subjective I think I'm coming to the end of my time here. I'm not waking up as tight in the morning, and the tightness thorughout the day is slowly getting better.    Pertinent History Diagnosed 10/03/14 with left ER/PR negative, HER2 positive breast cancer.  She also has a positive left axillary node. Bilateral mastectomy with 2 nodes removed from the left side, none on the right.on Nov. 21 with immedicate silicone implants. Has had chemotherapy and will continue with target therapy till June and will see about radition therapy in  January.     Patient Stated Goals pt wants to get back on the tennis court  by May    Currently in Pain? Yes   Pain Score 4    Pain Location Breast   Pain Orientation Left;Right  Rt is only 2-3/10   Pain Descriptors / Indicators Aching;Tightness   Pain Type Chronic pain   Pain Onset More than a month ago   Pain Frequency Constant   Aggravating Factors  nothing really   Pain Relieving Factors the soft tissue you guys do help, and I've been stertching more effectively at home  [facility] PT Assessment - 10/27/15 0001    AROM   Right Shoulder Flexion 149 Degrees   Right Shoulder ABduction 162 Degrees   Right Shoulder Internal Rotation 61 Degrees   Left Shoulder Flexion 146 Degrees   Left Shoulder ABduction 159 Degrees   Left Shoulder Internal Rotation 84 Degrees                     [facility] Adult PT Treatment/Exercise - 10/27/15 0001    Manual Therapy   Joint Mobilization joint traction and gentle glides in all directions  tp both shoulders    Soft tissue mobilization To bil chest wall focusing near bil sternum Lt lateral breast at tight pectoralis   Passive ROM In supine, left shoulder for  D2, abduction, and flexion, with stretch to patient tolerance, and same on Rt briefly                   Short Term Clinic Goals - 06/13/15 1307    CC Short Term Goal  #1   Title Short term goals = long term goals             Long Term Clinic Goals - 10/27/15 0936    CC Long Term Goal  #1   Title Patient will improve left shoulder flexion range of motion to 150 degrees so reach up in her kitchen cabinets    Status Achieved   CC Long Term Goal  #2   Title Patient will improve right shoulder flexion range of motion to 150  degrees to perform activities of daily living and household chores with greater ease   Status Achieved   CC Long Term Goal  #3   Title Patient will improve left shoulder abduction to 150 degrees so that she can wash her hair with greater ease     Baseline 09/26/15- 130 degrees, 159 degrees 10/27/15   Status Achieved   CC Long Term Goal  #4   Title Patient will improve right shoulder abduction to 150 degrees so that she can wash her hair with greater ease   Status Achieved   CC Long Term Goal  #5   Title Patient will be independent in advanced home exercise program   Status Achieved   CC Long Term Goal  #6   Title Pt will be independent with scar massage   Status Achieved   CC Long Term Goal  #7   Title Pt will improve left shoulder flexion to 165 degrees to allow pt to reach items on shelves   Baseline 152 on 10/14/2015, 146 degrees 10/27/15   Status On-going   CC Long Term Goal  #8   Title Pt will improve right shoulder flexion to 165 degrees to allow pt to reach items on shelf   Baseline 150 on 10/14/2015, 149 degrees 10/27/15   Status On-going            Plan - 10/27/15 0934    Clinical Impression Statement Pt continues with some tightness at end ROM stretching but her AROM measurements have greatly improved with IR from last time measured and pt is doing very well with compliance of her HEP. She wants to come one more time next week and then be  ready for D/C.   Rehab Potential Excellent   Clinical Impairments Affecting Rehab Potential continues with target therapy through IV every 3 weeks  Finished radiation treatment on 09/12/2015 .   PT Frequency 2x / week   PT  Duration 4 weeks   PT Treatment/Interventions Passive range of motion;Manual techniques;Therapeutic exercise;Patient/family education;ADLs/Self Care Home Management   PT Next Visit Plan  D/C next visit. Cont manual therapy focusing on decreasing chest tightness and progressing toward tennis.   Consulted and Agree with Plan of Care Patient      Patient will benefit from skilled therapeutic intervention in order to improve the following deficits and impairments:  Decreased range of motion, Postural dysfunction, Increased fascial restricitons, Impaired UE functional use, Decreased activity tolerance, Decreased skin integrity, Decreased scar mobility, Impaired flexibility, Decreased strength  Visit Diagnosis: Stiffness of right shoulder, not elsewhere classified  Stiffness of left shoulder, not elsewhere classified     Problem List Patient Active Problem List   Diagnosis Date Noted  . Allergic dermatitis 06/19/2015  . Nausea without vomiting 12/30/2014  . Diarrhea 12/30/2014  . Dehydration 12/30/2014  . Hypotension 12/30/2014  . 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 External hemorrhoid 12/13/2014  . UTI (urinary tract infection) 11/16/2014  . Genetic testing 11/07/2014  . Migraine headache 11/06/2014  . Family history of breast cancer   . Family history of colon cancer   . Breast cancer of upper-outer quadrant of left female breast ([place]) 10/11/2014    Otelia Limes, PTA 10/27/2015, 9:39 AM      PHYSICAL THERAPY DISCHARGE SUMMARY  Visits from Start of Care: 25  Current functional level related to goals / functional outcomes: As above, pt hoping to return to tennis    Remaining deficits: As above    Education / Equipment:  home exercise   Plan: Patient agrees to discharge.  Patient goals were not met. Patient is being discharged due to not returning since the last visit.  ?????         Maudry Diego, PT 11/04/2015 2:45 PM   [place], [place], [address] Phone: [phone]   Fax:  [phone]  Name: [name] [name] MRN: [id number] Date of Birth: [birth year]

## 2015-10-30 ENCOUNTER — Ambulatory Visit: Payer: BLUE CROSS/BLUE SHIELD

## 2015-10-30 ENCOUNTER — Ambulatory Visit: Payer: BLUE CROSS/BLUE SHIELD | Admitting: Radiation Oncology

## 2015-11-04 ENCOUNTER — Ambulatory Visit: Payer: BLUE CROSS/BLUE SHIELD | Admitting: Physical Therapy

## 2015-11-12 ENCOUNTER — Ambulatory Visit
Admission: RE | Admit: 2015-11-12 | Discharge: 2015-11-12 | Disposition: A | Discharge status: Home / Self Care | Payer: BLUE CROSS/BLUE SHIELD | Source: Ambulatory Visit | Attending: Radiation Oncology | Admitting: Radiation Oncology

## 2015-11-12 ENCOUNTER — Encounter: Payer: Self-pay | Admitting: Radiation Oncology

## 2015-11-12 VITALS — BP 104/76 | HR 65 | Temp 98.3°F | Resp 16 | Ht 60.5 in | Wt 111.1 lb

## 2015-11-12 DIAGNOSIS — C50412 Malignant neoplasm of upper-outer quadrant of left female breast: Secondary | ICD-10-CM

## 2015-11-12 NOTE — Progress Notes (Signed)
[name] [name] here for follow up.  She reports discomfort that she describes as numbness, tingling and muscle spasms in both breasts with the left worse than the right.  She said this started during radiation.  She reports that her energy level is OK but could be better.  The skin on her left breast is intact.   BP 104/76 mmHg  Pulse 65  Temp(Src) 98.3 F (36.8 C) (Oral)  Resp 16  Ht 5' 0.5" (1.537 m)  Wt 111 lb 1.6 oz (50.395 kg)  BMI 21.33 kg/m2   Wt Readings from Last 3 Encounters:  11/12/15 111 lb 1.6 oz (50.395 kg)  10/23/15 110 lb 11.2 oz (50.213 kg)  09/09/15 109 lb 4 oz (49.555 kg)

## 2015-11-12 NOTE — Progress Notes (Signed)
Radiation Oncology         [phone] ________________________________  Name: CHARRISSA GETTINGS MRN: [id number]  Date: 11/12/2015  DOB: 10-02-1960    Follow-Up Visit Note  CC: Laray Anger, MD  Nicholas Lose, MD    ICD-9-CM ICD-10-CM   1. Breast cancer of upper-outer quadrant of left female breast ([place]) 174.4 C50.412     Diagnosis:   Clinical stage IIA high-grade invasive ductal carcinoma of the left breast  Interval Since Last Radiation:  2 months ; 08/06/2015-09/12/2015  Narrative:    [name] [name] here for follow up. She reports discomfort that she describes as numbness, tingling and muscle spasms in both breasts with the left worse than the right. She said this started during radiation. This discomfort is described as frustrating rather than painful, which tylenol does not help with. Denies itching or swelling in her left arm or hand. She reports that her energy level is OK but could be better. She has been exercising, stretching, and playing tennis. The skin on her left breast is intact. She has completed chemotherapy and will be having her port-a-cath removed on June 1st.                                ALLERGIES:  is allergic to meperidine; stadol; zofran; and sulfa antibiotics.  Meds: Current Outpatient Prescriptions  Medication Sig Dispense Refill  . acetaminophen (TYLENOL) 325 MG tablet Take 650 mg by mouth every 6 (six) hours as needed for mild pain. Reported on 10/23/2015    . atenolol (TENORMIN) 25 MG tablet Take 75 mg by mouth daily. Reported on 10/23/2015    . B Complex-C (B-COMPLEX WITH VITAMIN C) tablet Take 1 tablet by mouth daily.    . Calcium Carbonate (CALCIUM 600 PO) Take 1 tablet by mouth daily.     . cholecalciferol (VITAMIN D) 1000 UNITS tablet Take 1,000 Units by mouth daily.    . Diclofenac Potassium 50 MG PACK Take 50 mg by mouth. Reported on 09/09/2015    . eletriptan (RELPAX) 40 MG tablet Take 1 tablet (40 mg total) by mouth every 2 (two) hours as needed for  migraine or headache (Max 2 per day). 10 tablet 3  . Lactobacillus (ACIDOPHILUS PO) Take 1 capsule by mouth daily.     Marland Kitchen lidocaine-prilocaine (EMLA) cream Apply to affected area once (Patient taking differently: Apply 1 application topically as needed. Apply to affected area once) 30 g 3  . mirabegron ER (MYRBETRIQ) 50 MG TB24 tablet Take 50 mg by mouth daily.    Marland Kitchen PARoxetine (PAXIL) 20 MG tablet Take 20 mg by mouth.    . betamethasone valerate (VALISONE) 0.1 % cream Apply topically. Reported on 11/12/2015    . docusate sodium (COLACE) 100 MG capsule Take 100 mg by mouth 2 (two) times daily as needed for mild constipation. Reported on 11/12/2015    . hyaluronate sodium (RADIAPLEXRX) GEL Apply 1 application topically 2 (two) times daily. Reported on 11/12/2015    . hydrocortisone 2.5 % ointment Apply topically 2 (two) times daily. (Patient not taking: Reported on 11/12/2015) 30 g 0  . ibuprofen (ADVIL,MOTRIN) 200 MG tablet Take 200 mg by mouth.    . non-metallic deodorant Jethro Poling) MISC Apply 1 application topically daily as needed. Reported on 10/23/2015    . promethazine (PHENERGAN) 12.5 MG tablet Take 1 tablet (12.5 mg total) by mouth every 6 (six) hours as needed for nausea or  vomiting. (Patient not taking: Reported on 11/12/2015) 90 tablet 3   No current facility-administered medications for this encounter.    Physical Findings: The patient is in no acute distress. Patient is alert and oriented.  height is 5' 0.5" (1.537 m) and weight is 111 lb 1.6 oz (50.395 kg). Her oral temperature is 98.3 F (36.8 C). Her blood pressure is 104/76 and her pulse is 65. Her respiration is 16. Marland Kitchen  No palpable supraclavicular or axillary adenopathy. The lungs are clear to auscultation. The heart has a regular rhythm and rate. The abdomen is soft and nontender with normal bowel sounds. Chest exam, patient has bilateral implants in place. No palpable mass or nipple discharge. Chest is well healed with slight  hyperpigmentation changes.   Lab Findings: Lab Results  Component Value Date   WBC 4.0 10/23/2015   HGB 11.7 10/23/2015   HCT 35.6 10/23/2015   MCV 85.1 10/23/2015   PLT 186 10/23/2015    Radiographic Findings: No results found.  Impression:  The patient is recovering from the effects of radiation.  No signs of recurrence on clinical exam today.  Plan:  Follow up in 6 months.    -----------------------------------  Blair Promise, PhD, MD   This document serves as a record of services personally performed by Gery Pray, MD. It was created on his behalf by Arlyce Harman, a trained medical scribe. The creation of this record is based on the scribe's personal observations and the provider's statements to them. This document has been checked and approved by the attending provider.

## 2015-11-18 ENCOUNTER — Encounter: Payer: BLUE CROSS/BLUE SHIELD | Admitting: Physical Therapy

## 2015-11-20 ENCOUNTER — Encounter: Payer: Self-pay | Admitting: General Practice

## 2015-11-20 NOTE — Progress Notes (Signed)
Spiritual Care Note  Left VM for Lindalee, who has verbalized interest in counseling at [facility], to notify her that an opening has become available.  Requested call back to initiate counselor contact.  [place], [place], [facility] Pager [phone] Voicemail [phone]

## 2015-11-25 ENCOUNTER — Encounter: Payer: Self-pay | Admitting: General Practice

## 2015-11-25 NOTE — Progress Notes (Signed)
Telephone call  Per referral from Lorrin Jackson, Counselor called client and left a voicemail regarding scheduling the first counseling appointment. Counselor will wait to hear back from client.   Wendall Papa, MS, [place], LPCA Counseling Intern-Department for Spiritual Care and [place], [place], [place]

## 2015-12-01 ENCOUNTER — Encounter: Payer: Self-pay | Admitting: General Practice

## 2015-12-01 NOTE — Progress Notes (Signed)
Telephone call  Counselor contacted client and scheduled a counseling appointment for Wednesday, June 14th at 1:00 pm.    Wendall Papa, MS, [place], LPCA Counseling Intern-Department for Spiritual Care and [facility] Julianne Rice, [place]

## 2015-12-10 ENCOUNTER — Encounter: Payer: Self-pay | Admitting: General Practice

## 2015-12-10 NOTE — Progress Notes (Signed)
Counseling session  First individual session with client. Counselor discussed and reviewed the PDS and informed consent forms with client. Counselor and client both signed forms. Counselor used open question to inquire about presenting concerns, and noted that she had met with Lattie Haw in the support office and wanted a referral. Client described how she was diagnosed with breast cancer a year ago, and just finished her treatment not too long ago. Counselor and client processed client's emotions and feelings regarding being out of treatment, that included joy but also fear about what the future holds. Client stated how she lost both of her parents when they were young, and how that has made her cancer journey and process more difficult in some ways. Further, the client described how there have been a series of other family stressors that have added to her anxiety levels and made it difficult to prioritize and focus on herself. Counselor and client explored client's coping mechanisms during and after treatment that included her family, friends, exercise, and her faith. Counselor used reflection regarding the multiple roles the client has taken on, that has detracted focus from her and allowing space to process everything she has been through. Counselor performed risk assessment. Client denied S/I and H/I. Counselor clarified client goals in counseling that including having space to process her cancer journey, learning to manage and cope with stress better, and prioritize and take care of herself. Counselor and client processed what the "down" days feel like, which included feeling angry, sad, irritable, and anxious about the future. Counselor used immediacy to point out how resilient the client has been given the series of struggles she has personally and interpersonally faced in her life. Counselor will follow up with client next Wednesday, 21st at 1:00 pm for another counseling appointment.  Wendall Papa, MS, [place],  LPCA Counseling Intern-Department for Spiritual Care and [place], [place], [place]

## 2015-12-23 ENCOUNTER — Ambulatory Visit (HOSPITAL_BASED_OUTPATIENT_CLINIC_OR_DEPARTMENT_OTHER): Payer: BLUE CROSS/BLUE SHIELD | Admitting: Nurse Practitioner

## 2015-12-23 ENCOUNTER — Encounter: Payer: Self-pay | Admitting: Nurse Practitioner

## 2015-12-23 VITALS — BP 93/58 | HR 64 | Temp 97.5°F | Resp 18 | Ht 60.5 in | Wt 112.7 lb

## 2015-12-23 DIAGNOSIS — M79642 Pain in left hand: Secondary | ICD-10-CM | POA: Diagnosis not present

## 2015-12-23 DIAGNOSIS — Z853 Personal history of malignant neoplasm of breast: Secondary | ICD-10-CM

## 2015-12-23 DIAGNOSIS — R209 Unspecified disturbances of skin sensation: Secondary | ICD-10-CM

## 2015-12-23 DIAGNOSIS — C50412 Malignant neoplasm of upper-outer quadrant of left female breast: Secondary | ICD-10-CM

## 2015-12-23 NOTE — Progress Notes (Signed)
CLINIC:  Cancer Survivorship   REASON FOR VISIT:  Routine follow-up post-treatment for a recent history of breast cancer.  BRIEF ONCOLOGIC HISTORY:    Breast cancer of upper-outer quadrant of left female breast ([place])   10/01/2014 Mammogram Left breast mammogram and ultrasound for palpable left breast mass which was a maxillary mass with an ill-defined abnormality by ultrasound 1.7 cm at 2:00   10/11/2014 Initial Diagnosis Left breast invasive ductal carcinoma, grade 3, ER/PR negative, 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 5.32, axillary lymph node also positive for cancer   10/22/2014 Breast MRI Left breast: 1.9 cm irr.enhancing mass in UOQ; 1.0 cm suspicious in UIQ. Right breast: 1.2 cm sl. Irr. enhancing mass in posterior third of UOQ. Multiple enlarged level 1 and 2 left axillary LN.   10/28/2014 Procedure GT: no del mutations at APC, ATM, AXIN2, BARD1, BMPR1A, BRCA1, BRCA2, BRIP1, CDH1, CDK4, CDKN2A, CHEK2, EPCAM, FANCC, MLH1, MSH2, MSH6, MUTYH, NBN, PALB2, PMS2, POLD1, POLE, PTEN, RAD51C, RAD51D, SCG5/GREM1, SMAD4, STK11, TP53, VHL, and XRCC2   10/30/2014 Imaging CT chest abdomen pelvis and bone scan revealed no evidence of distant metastatic disease but enlarged left axillary and left subpectoral lymph nodes   10/30/2014 Clinical Stage Stage IIA: T1c N1   11/01/2014 - 02/14/2015 Neo-Adjuvant Chemotherapy Neoadjuvant TCH Perjeta 6 cycles followed by Herceptin maintenance (completed 10/23/2015)   02/17/2015 Breast MRI No residual mass or abnormal enhancement, no enlarged lymph nodes   05/19/2015 Surgery Bilateral mastectomies with immediate reconstruction Left: Radial scar, FA, UDH no malignancy 0/1 lymph node negative; right: FA, radial scar, UV H    05/19/2015 Pathologic Stage complete path response: no malignancy ypT0 ypN0   08/06/2015 - 09/12/2015 Radiation Therapy Adjuvant XRT: Left chest wall, axilla, SCV: 45 Gy over 25 fractions    INTERVAL HISTORY:  [name] [name] presents to the [facility] today for our  initial meeting to review her survivorship care plan detailing her treatment course for breast cancer, as well as monitoring long-term side effects of that treatment, education regarding health maintenance, screening, and overall wellness and health promotion.     Overall, [name] [name] reports feeling quite well since completing her radiation therapy approximately three months ago and her maintenance trastuzumab approximately two months ago.  She continues with numbness and tingling in both breasts. She denies any mass or lesion along her reconstruction, although she continues with discomfort / tightness from the implants. She sees Dr. Harlow Mares tomorrow.  She has fatigue but continues to be active with exercise.  Her port a cath is scheduled to be removed later this month at which time she will also undergo lipofilling per Dr. Harlow Mares.  She denies headache, cough, shortness of breath or bone pain.  She has had some numbeness in her left hand / fingers that has occurred intermittently over the last two weeks.  It had gone away and has recently returned two days ago.  It lasts for a few days and goes away.  She has a good appetite and denies weight loss.  She has been meeting regularly with Wendall Papa, Interim Chaplain, regarding her emotions and "processing" things now that she has completed active treatment. She underwent colonoscopy for a history of blood in stools as well as an endoscopy with esophageal dilitation for a stricture recently, and is doing well.    REVIEW OF SYSTEMS:  General: Fatigue as above. Denies fever, chills, unintentional weight loss, or night sweats. HEENT: Denies visual changes, hearing loss, mouth sores or difficulty swallowing. Cardiac: Denies palpitations, chest  pain, and lower extremity edema.  Respiratory: Denies wheeze or dyspnea on exertion.  Breast: As above. GI: Denies abdominal pain, constipation, diarrhea, nausea, or vomiting.  GU: Denies dysuria, hematuria, vaginal  bleeding, vaginal discharge, or vaginal dryness.  Musculoskeletal: As above. Neuro: As above. Skin: Denies rash, pruritis, or open wounds.  Psych: As above.   A 14-point review of systems was completed and was negative, except as noted above.   ONCOLOGY TREATMENT TEAM:  1. Surgeon:  Dr. Brantley Stage at [facility]  2. Medical Oncologist: Dr. Lindi Adie 3. Radiation Oncologist: Dr. Sondra Come  4. Plastic Surgeon: Dr. Harlow Mares   PAST MEDICAL/SURGICAL HISTORY:  Past Medical History  Diagnosis Date  . Osteopenia   . Neurogenic bladder   . Wears contact lenses   . Family history of breast cancer   . Family history of colon cancer   . Breast cancer of upper-outer quadrant of right female breast ([place]) 10/11/2014  . Breast cancer ([place]) 2016    ER-/PR-/Her2+  . Migraines     takes metoprolol  for migraine headaches  . Radiation 08/06/15-09/12/15    left chest wall axillary and supraclavicular region 45 gray   Past Surgical History  Procedure Laterality Date  . Vein ligation      both legs  . Colonoscopy    . Upper gi endoscopy    . Portacath placement Right 10/29/2014    Procedure: INSERTION PORT-A-CATH WITH ULTRASOUND;  Surgeon: Erroll Luna, MD;  Location: [place];  Service: General;  Laterality: Right;  . Breast surgery      rt, breast biopsy  . Nipple sparing mastectomy/sentinal lymph node biopsy/reconstruction/placement of tissue expander Bilateral 05/19/2015    Procedure: BILATERAL NIPPLE SPARING MASTECTOMY WITH LEFT AXILLARY SENTINAL LYMPH NODE BIOPSY;  Surgeon: Erroll Luna, MD;  Location: [place];  Service: General;  Laterality: Bilateral;  . Breast reconstruction with placement of tissue expander and flex hd (acellular hydrated dermis) Bilateral 05/19/2015    Procedure: BILATERAL NIPPLE SPARING MASTECTOMY WITH LEFT AXILLARY SENTINAL LYMPH NODE BIOPSY;  Surgeon: Erroll Luna, MD;  Location: [place];  Service: General;  Laterality: Bilateral;     ALLERGIES:    Allergies  Allergen Reactions  . Meperidine Nausea And Vomiting  . Stadol [Butorphanol] Nausea And Vomiting  . Zofran [Ondansetron Hcl] Other (See Comments)    Makes migraine headaches intensify   . Sulfa Antibiotics Rash     CURRENT MEDICATIONS:  Current Outpatient Prescriptions on File Prior to Visit  Medication Sig Dispense Refill  . acetaminophen (TYLENOL) 325 MG tablet Take 650 mg by mouth every 6 (six) hours as needed for mild pain. Reported on 12/23/2015    . atenolol (TENORMIN) 25 MG tablet Take 75 mg by mouth daily. Reported on 10/23/2015    . Calcium Carbonate (CALCIUM 600 PO) Take 1 tablet by mouth daily.     . cholecalciferol (VITAMIN D) 1000 UNITS tablet Take 1,000 Units by mouth daily.    . Diclofenac Potassium 50 MG PACK Take 50 mg by mouth. Reported on 09/09/2015    . eletriptan (RELPAX) 40 MG tablet Take 1 tablet (40 mg total) by mouth every 2 (two) hours as needed for migraine or headache (Max 2 per day). 10 tablet 3  . Lactobacillus (ACIDOPHILUS PO) Take 1 capsule by mouth daily.     . non-metallic deodorant Jethro Poling) MISC Apply 1 application topically daily as needed. Reported on 10/23/2015    . PARoxetine (PAXIL) 20 MG tablet Take 20 mg by mouth.    Marland Kitchen  docusate sodium (COLACE) 100 MG capsule Take 100 mg by mouth 2 (two) times daily as needed for mild constipation. Reported on 12/23/2015    . hydrocortisone 2.5 % ointment Apply topically 2 (two) times daily. (Patient not taking: Reported on 11/12/2015) 30 g 0  . ibuprofen (ADVIL,MOTRIN) 200 MG tablet Take 200 mg by mouth. Reported on 12/23/2015    . promethazine (PHENERGAN) 12.5 MG tablet Take 1 tablet (12.5 mg total) by mouth every 6 (six) hours as needed for nausea or vomiting. (Patient not taking: Reported on 11/12/2015) 90 tablet 3   No current facility-administered medications on file prior to visit.     ONCOLOGIC FAMILY HISTORY:  Family History  Problem Relation Age of Onset  . Brain cancer Mother 52    Glioblastoma   . Colon cancer Father 67  . Colon cancer Maternal Uncle 55  . Lung cancer Paternal Aunt   . Lung cancer Maternal Grandfather 90  . Lung cancer Paternal Grandfather 78  . Melanoma Brother 30    insitu  . Rheumatic fever Maternal Grandmother     in her 50s  . Breast cancer Paternal Grandmother 80     GENETIC COUNSELING/TESTING: Yes, performed 10/28/2014 with no deleterious mutations at APC, ATM, AXIN2, BARD1, BMPR1A, BRCA1, BRCA2, BRIP1, CDH1, CDK4, CDKN2A, CHEK2, EPCAM, FANCC, MLH1, MSH2, MSH6, MUTYH, NBN, PALB2, PMS2, POLD1, POLE, PTEN, RAD51C, RAD51D, SCG5/GREM1, SMAD4, STK11, TP53, VHL, and XRCC2.   SOCIAL HISTORY:  [name] [name] is married and lives with her family in [place], [place].  She has 3 children. 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  She denies any current or history of tobacco or illicit drug use.  She uses alcohol several times / week.   PHYSICAL EXAMINATION:  Vital Signs: Filed Vitals:   12/23/15 0912  BP: 93/58  Pulse: 64  Temp: 97.5 F (36.4 C)  Resp: 18   ECOG Performance Status: 0  General: Well-nourished, well-appearing female in no acute distress.  She is unaccompanied in clinic today.   HEENT: Head is atraumatic and normocephalic.  Pupils equal and reactive to light and accomodation. Conjunctivae clear without exudate.  Sclerae anicteric. Oral mucosa is pink, moist, and intact without lesions.  Oropharynx is pink without lesions or erythema.  Lymph: No cervical, supraclavicular, infraclavicular, or axillary lymphadenopathy noted on palpation.  Cardiovascular: Regular rate and rhythm without murmurs, rubs, or gallops. Respiratory: Clear to auscultation bilaterally. Chest expansion symmetric without accessory muscle use on inspiration or expiration.  GI: Abdomen soft and round. No tenderness to palpation. Bowel sounds normoactive in 4 quadrants.  GU: Deferred.  Neuro: No focal deficits. Steady gait.  Psych: Mood  and affect normal and appropriate for situation.  Extremities: No edema, cyanosis, or clubbing.  Skin: Warm and dry. No open lesions noted.   LABORATORY DATA:  None for this visit.  DIAGNOSTIC IMAGING:  None for this visit.     ASSESSMENT AND PLAN:   1. 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  [name] [name] is doing well without clinical symptoms worrisome for disease recurrence. She will follow-up with her medical oncologist,  Dr.  Lindi Adie, in October 2017 with history and physical examination per surveillance protocol. She will see her radiation oncologist, Dr. Sondra Come, in November 2017 and will continue ongoing follow up with her surgeons, Dr. Brantley Stage and Harlow Mares.  A comprehensive survivorship care plan and treatment summary was reviewed with the patient today detailing her breast cancer diagnosis, treatment course, potential late/long-term effects of treatment, appropriate follow-up care with recommendations for the future, and patient education resources.  A copy of this summary, along with a letter will be sent to the patient's primary care provider via in basket message after today's visit.  [name] [name] is welcome to return to the Survivorship Clinic in the future, as needed; no follow-up will be scheduled at this time.    2. Left hand pain / numbness: I do not believe that this is related to lymphedema, however, I have asked [name] [name] to follow back up with our physical therapists to evaluate this more fully.  It is not consistent with  neuropathy related to her chemotherapy.  It is intermittent and does not impair her motor function. Further disposition pending PT's evaluation and recommendations.  3. Cancer screening:  Due to [name] [name]'s history and her age, she should receive screening for skin cancers, colon cancer (every 5 years due to her family history), and gynecologic cancers.  The information and recommendations are listed on the patient's comprehensive care plan/treatment summary and were reviewed in detail with the patient.    4. Health maintenance and wellness promotion: [name] [name] was encouraged to consume 5-7 servings of fruits and vegetables per day. We reviewed the "Nutrition Rainbow" handout, as well as discussed recommendations to maximize nutrition and minimize recurrence, such as increased intake of fruits, vegetables, lean proteins, and minimizing the intake of red meats and processed foods.  She was also encouraged to engage in moderate to vigorous exercise for 30 minutes per day most days of the week. We discussed the [employer] fitness program, which is designed for cancer survivors to help them become more physically fit after cancer treatments.  She was instructed to limit her alcohol consumption and continue to abstain from tobacco use.  A copy of the "Take Control of Your Health" brochure was given to her reinforcing these recommendations.   5. Support services/counseling: It is not uncommon for this period of the patient's cancer care trajectory to be one of many emotions and stressors.  We discussed an opportunity for her to participate in the next session of [facility] ("Finding Your New Normal") support group series designed for patients after they have completed treatment.   [name] [name] was encouraged to take advantage of our many other support services programs, support groups, and/or counseling in coping with her new life as a cancer survivor after completing anti-cancer treatment.  She was offered  support today through active listening and expressive supportive counseling.  She was given information regarding our available services and encouraged to contact me with any questions or for help enrolling in any of our support group/programs. She will continue to meet with Wendall Papa, as above.    A total of 65 minutes of face-to-face time was spent with this patient with greater than 50% of that time in counseling and care-coordination.   Sylvan Cheese, NP  Survivorship Program [facility] [phone]   Note: PRIMARY CARE PROVIDER Laray Anger, [place] [phone]

## 2016-01-06 ENCOUNTER — Encounter: Payer: Self-pay | Admitting: General Practice

## 2016-01-06 NOTE — Progress Notes (Signed)
Telephone  Counselor followed up with client regarding her canceling her counseling appointment today due to surgery recovery. Counselor used open question to see how client was feeling, and provided support. Counselor and client scheduled another counseling appointment for Wednesday, July 26th at 1 pm.  Wendall Papa, MS, [place], LPCA Counseling Intern-Department for Spiritual Care and [facility] Julianne Rice, [place]

## 2016-01-21 ENCOUNTER — Encounter: Payer: Self-pay | Admitting: General Practice

## 2016-01-21 NOTE — Progress Notes (Signed)
Counseling session  Counselor summarized last session, and brought up the discussion of learning to function in the dysfunction. Client reported that resonated with her, and that she has been thinking about ways to have joy even in midst of the suffering. Counselor and client processed client's experience of struggle in the past year, particularly around taking care of her in-laws. Counselor and client talked about the role in her system, and always being one to take care and serve others. Client reported having more peace around where she is in her life, and feeling like she has learned to manage her anxiety better. Counselor reflected client's growth and ability to shift her perspective, and allowing herself to feel happy even when there are challenges in her life. Counselor and client processed what has helped her function in the dysfunction including-faith, family, church, and counseling. Counselor validated client's ability to weather difficult losses, diagnoses, and pain in her life and acknowledged the struggle. Counselor and client explored what she has learned about herself, and how she will keep moving forward in her life and stay mentally well. Counselor reminded client of incoming counselors in the fall, and if she wanted to see someone to contact [place]. Counselor and client terminated.   Wendall Papa, MS, [place], LPCA Counseling Intern-Department for Spiritual Care and [place], [place], [place]

## 2016-03-23 ENCOUNTER — Telehealth: Payer: Self-pay

## 2016-04-20 ENCOUNTER — Ambulatory Visit (HOSPITAL_BASED_OUTPATIENT_CLINIC_OR_DEPARTMENT_OTHER): Payer: BLUE CROSS/BLUE SHIELD | Admitting: Hematology and Oncology

## 2016-04-20 ENCOUNTER — Encounter: Payer: Self-pay | Admitting: Hematology and Oncology

## 2016-04-20 DIAGNOSIS — D6481 Anemia due to antineoplastic chemotherapy: Secondary | ICD-10-CM | POA: Diagnosis not present

## 2016-04-20 DIAGNOSIS — M25561 Pain in right knee: Secondary | ICD-10-CM | POA: Diagnosis not present

## 2016-04-20 DIAGNOSIS — Z853 Personal history of malignant neoplasm of breast: Secondary | ICD-10-CM

## 2016-04-20 DIAGNOSIS — M791 Myalgia: Secondary | ICD-10-CM

## 2016-04-20 DIAGNOSIS — C50412 Malignant neoplasm of upper-outer quadrant of left female breast: Secondary | ICD-10-CM

## 2016-04-20 DIAGNOSIS — M25562 Pain in left knee: Secondary | ICD-10-CM | POA: Diagnosis not present

## 2016-04-20 DIAGNOSIS — Z171 Estrogen receptor negative status [ER-]: Principal | ICD-10-CM

## 2016-04-20 NOTE — Progress Notes (Signed)
Patient Care Team: Laray Anger, MD as PCP - General (Obstetrics and Gynecology) Erroll Luna, MD as Consulting Physician (General Surgery) Nicholas Lose, MD as Consulting Physician (Hematology and Oncology) Gery Pray, MD as Consulting Physician (Radiation Oncology) Mauro Kaufmann, RN as Registered Nurse Rockwell Germany, RN as Registered Nurse  DIAGNOSIS:  Encounter Diagnosis  Name Primary?  . Malignant neoplasm of upper-outer quadrant of left breast in female, estrogen receptor negative ([place])     SUMMARY OF ONCOLOGIC HISTORY:   Breast cancer of upper-outer quadrant of left female breast ([place])   10/01/2014 Mammogram    Left breast mammogram and ultrasound for palpable left breast mass which was a maxillary mass with an ill-defined abnormality by ultrasound 1.7 cm at 2:00      10/11/2014 Initial Diagnosis    Left breast invasive ductal carcinoma, grade 3, ER/PR negative, 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 5.32, axillary lymph node also positive for cancer      10/22/2014 Breast MRI    Left breast: 1.9 cm irr.enhancing mass in UOQ; 1.0 cm suspicious in UIQ. Right breast: 1.2 cm sl. Irr. enhancing mass in posterior third of UOQ. Multiple enlarged level 1 and 2 left axillary LN.      10/28/2014 Procedure    GT: no del mutations at APC, ATM, AXIN2, BARD1, BMPR1A, BRCA1, BRCA2, BRIP1, CDH1, CDK4, CDKN2A, CHEK2, EPCAM, FANCC, MLH1, MSH2, MSH6, MUTYH, NBN, PALB2, PMS2, POLD1, POLE, PTEN, RAD51C, RAD51D, SCG5/GREM1, SMAD4, STK11, TP53, VHL, and XRCC2      10/30/2014 Imaging    CT chest abdomen pelvis and bone scan revealed no evidence of distant metastatic disease but enlarged left axillary and left subpectoral lymph nodes      10/30/2014 Clinical Stage    Stage IIA: T1c N1      11/01/2014 - 02/14/2015 Neo-Adjuvant Chemotherapy    Neoadjuvant T[facility] 6 cycles followed by Herceptin maintenance (completed 10/23/2015)      02/17/2015 Breast MRI    No residual mass or abnormal enhancement, no  enlarged lymph nodes      05/19/2015 Surgery    Bilateral mastectomies with immediate reconstruction Left: Radial scar, FA, UDH no malignancy 0/1 lymph node negative; right: FA, radial scar, UV H       05/19/2015 Pathologic Stage    complete path response: no malignancy ypT0 ypN0      08/06/2015 - 09/12/2015 Radiation Therapy    Adjuvant XRT: Left chest wall, axilla, SCV: 45 Gy over 25 fractions      12/23/2015 Survivorship    SCP visit completed and copy given to patient       CHIEF COMPLIANT: fatigue  INTERVAL HISTORY: [name] [name] is a 55 year old with above-mentioned history of left breast cancer a complete response to neoadjuvant chemotherapy followed by bilateral mastectomies and reconstruction area and 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 She is complaining of diffuse muscle aches and pains especially in her knees. She is staying as active as possible. She denies some neuropathy. She does have shortness of breath to exertion. She also complains of joint aches and pains.  REVIEW OF SYSTEMS:   Constitutional: Denies fevers, chills or abnormal weight loss Eyes: Denies blurriness of vision Ears, nose, mouth, throat, and face: Denies mucositis or sore throat Respiratory: Denies cough, dyspnea or wheezes Cardiovascular: Denies palpitation, chest discomfort Gastrointestinal:  Denies nausea, heartburn or change in bowel habits Skin: Denies abnormal skin rashes Lymphatics: Denies new lymphadenopathy or easy bruising Neurological:Denies numbness, tingling or new weaknesses Behavioral/Psych: Mood  is stable, no new changes  Extremities: No lower extremity edema Breast:  denies any pain or lumps or nodules in either breasts All other systems were reviewed with the patient and are negative.  I have reviewed the past medical history, past surgical history, social history and family history with the patient and they are unchanged from previous note.  ALLERGIES:   is allergic to meperidine; stadol [butorphanol]; zofran [ondansetron hcl]; and sulfa antibiotics.  MEDICATIONS:  Current Outpatient Prescriptions  Medication Sig Dispense Refill  . acetaminophen (TYLENOL) 325 MG tablet Take 650 mg by mouth every 6 (six) hours as needed for mild pain. Reported on 12/23/2015    . atenolol (TENORMIN) 25 MG tablet Take 75 mg by mouth daily. Reported on 10/23/2015    . Calcium Carbonate (CALCIUM 600 PO) Take 1 tablet by mouth daily.     . cholecalciferol (VITAMIN D) 1000 UNITS tablet Take 1,000 Units by mouth daily.    . Diclofenac Potassium 50 MG PACK Take 50 mg by mouth. Reported on 09/09/2015    . docusate sodium (COLACE) 100 MG capsule Take 100 mg by mouth 2 (two) times daily as needed for mild constipation. Reported on 12/23/2015    . eletriptan (RELPAX) 40 MG tablet Take 1 tablet (40 mg total) by mouth every 2 (two) hours as needed for migraine or headache (Max 2 per day). 10 tablet 3  . hydrocortisone 2.5 % ointment Apply topically 2 (two) times daily. (Patient not taking: Reported on 11/12/2015) 30 g 0  . ibuprofen (ADVIL,MOTRIN) 200 MG tablet Take 200 mg by mouth. Reported on 12/23/2015    . Lactobacillus (ACIDOPHILUS PO) Take 1 capsule by mouth daily.     Marland Kitchen LORazepam (ATIVAN) 0.5 MG tablet Take 0.5 mg by mouth. Reported on 7/67/2094    . non-metallic deodorant Jethro Poling) MISC Apply 1 application topically daily as needed. Reported on 10/23/2015    . PARoxetine (PAXIL) 20 MG tablet Take 20 mg by mouth.    . promethazine (PHENERGAN) 12.5 MG tablet Take 1 tablet (12.5 mg total) by mouth every 6 (six) hours as needed for nausea or vomiting. (Patient not taking: Reported on 11/12/2015) 90 tablet 3  . tolterodine (DETROL LA) 4 MG 24 hr capsule Take 4 mg by mouth.     No current facility-administered medications for this visit.     PHYSICAL EXAMINATION: ECOG PERFORMANCE STATUS: 1 - Symptomatic but completely ambulatory  Vitals:   04/20/16 1122  BP: 103/62  Pulse: 60    Resp: 17  Temp: 97.6 F (36.4 C)   Filed Weights   04/20/16 1122  Weight: 114 lb 4.8 oz (51.8 kg)    GENERAL:alert, no distress and comfortable SKIN: skin color, texture, turgor are normal, no rashes or significant lesions EYES: normal, Conjunctiva are pink and non-injected, sclera clear OROPHARYNX:no exudate, no erythema and lips, buccal mucosa, and tongue normal  NECK: supple, thyroid normal size, non-tender, without nodularity LYMPH:  no palpable lymphadenopathy in the cervical, axillary or inguinal LUNGS: clear to auscultation and percussion with normal breathing effort HEART: regular rate & rhythm and no murmurs and no lower extremity edema ABDOMEN:abdomen soft, non-tender and normal bowel sounds MUSCULOSKELETAL:no cyanosis of digits and no clubbing  NEURO: alert & oriented x 3 with fluent speech, no focal motor/sensory deficits EXTREMITIES: No lower extremity edema  LABORATORY DATA:  I have reviewed the data as listed   Chemistry      Component Value Date/Time   NA 141 10/23/2015 0826  K 4.0 10/23/2015 0826   CL 98 (L) 05/20/2015 0300   CO2 29 10/23/2015 0826   BUN 19.6 10/23/2015 0826   CREATININE 0.8 10/23/2015 0826      Component Value Date/Time   CALCIUM 9.5 10/23/2015 0826   ALKPHOS 101 10/23/2015 0826   AST 39 (H) 10/23/2015 0826   ALT 37 10/23/2015 0826   BILITOT 0.46 10/23/2015 0826       Lab Results  Component Value Date   WBC 4.0 10/23/2015   HGB 11.7 10/23/2015   HCT 35.6 10/23/2015   MCV 85.1 10/23/2015   PLT 186 10/23/2015   NEUTROABS 2.7 10/23/2015     ASSESSMENT & PLAN:  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:00 position, left axillary lymph node palpable, ER/PR negative, 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 5.32, T1 cN1 M0 stage II a clinical stage Treatment summary: Neo adjuvant chemotherapy with [facility] for general every 3 weeks 6 cycles started 11/01/2014 completed  02/14/2015 Post-neo-adjuvant breast MRI 02/17/2015: No residual mass or abnormal enhancement, no enlarged lymph nodes Post-neo-adjuvant CT chest 02/18/2015: Interval resolution of the left breast mass and multiple enlarged left axillary and subpectoral lymph nodes Bilateral mastectomies 05/19/2015 Left: Radial scar, FA, UDH no malignancy 0/1 lymph node negative; right: FA, radial scar, UDH no malignancy (pathologic complete response) [id number]  Herceptin maintenance completed April 2017 Adjuvant radiation therapy 08/06/2015- 09/12/15 ---------------------------------------------------------------------------------------------------------------------------------------------------- Current treatment:Surveillance Chemotherapy-induced anemia: monitoring hemoglobin 11.7  We will plan on repeating CT scan of the chest to evaluate the pectoral nodes in 6 months and follow-up  Return to clinic in 6 months for surveillance and follow-up   No orders of the defined types were placed in this encounter.  The patient has a good understanding of the overall plan. she agrees with it. she will call with any problems that may develop before the next visit here.   Rulon Eisenmenger, MD 04/20/16

## 2016-04-20 NOTE — Assessment & Plan Note (Signed)
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:00 position, left axillary lymph node palpable, ER/PR negative, 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 5.32, T1 cN1 M0 stage II a clinical stage Treatment summary: Neo adjuvant chemotherapy with [facility] for general every 3 weeks 6 cycles started 11/01/2014 completed 02/14/2015 Post-neo-adjuvant breast MRI 02/17/2015: No residual mass or abnormal enhancement, no enlarged lymph nodes Post-neo-adjuvant CT chest 02/18/2015: Interval resolution of the left breast mass and multiple enlarged left axillary and subpectoral lymph nodes Bilateral mastectomies 05/19/2015 Left: Radial scar, FA, UDH no malignancy 0/1 lymph node negative; right: FA, radial scar, UDH no malignancy (pathologic complete response) [id number]  Herceptin maintenance to complete April 2017 Adjuvant radiation therapy 08/06/2015- 09/12/15 ---------------------------------------------------------------------------------------------------------------------------------------------------- Current treatment:Surveillance Chemotherapy-induced anemia: monitoring hemoglobin 11.7  We will plan on repeating CT scan of the chest to evaluate the pectoral nodes in approximately one year  Return to clinic in 6 months for surveillance and follow-up

## 2016-04-23 ENCOUNTER — Ambulatory Visit: Payer: BLUE CROSS/BLUE SHIELD | Admitting: Hematology and Oncology

## 2016-05-13 ENCOUNTER — Encounter: Payer: Self-pay | Admitting: Radiation Oncology

## 2016-05-13 ENCOUNTER — Ambulatory Visit
Admission: RE | Admit: 2016-05-13 | Discharge: 2016-05-13 | Disposition: A | Discharge status: Home / Self Care | Payer: BLUE CROSS/BLUE SHIELD | Source: Ambulatory Visit | Attending: Radiation Oncology | Admitting: Radiation Oncology

## 2016-05-13 DIAGNOSIS — Z79899 Other long term (current) drug therapy: Secondary | ICD-10-CM | POA: Diagnosis not present

## 2016-05-13 DIAGNOSIS — Z923 Personal history of irradiation: Secondary | ICD-10-CM | POA: Insufficient documentation

## 2016-05-13 DIAGNOSIS — Z888 Allergy status to other drugs, medicaments and biological substances status: Secondary | ICD-10-CM | POA: Diagnosis not present

## 2016-05-13 DIAGNOSIS — C50412 Malignant neoplasm of upper-outer quadrant of left female breast: Secondary | ICD-10-CM | POA: Diagnosis present

## 2016-05-13 DIAGNOSIS — Y842 Radiological procedure and radiotherapy as the cause of abnormal reaction of the patient, or of later complication, without mention of misadventure at the time of the procedure: Secondary | ICD-10-CM | POA: Insufficient documentation

## 2016-05-13 DIAGNOSIS — Z171 Estrogen receptor negative status [ER-]: Secondary | ICD-10-CM | POA: Insufficient documentation

## 2016-05-13 NOTE — Progress Notes (Signed)
[name] [name] here for follow up after treatment to her left chest wall.  She reports having some discomfort in her chest that she describes as a "tightness."  She reports her left side is worse since radiation.  She reports her energy level is improving.  She reports she will have a CT scan in May and will see Dr. Lindi Adie in April.  The skin on her left chest is intact.    BP 108/81 (BP Location: Right Arm, Patient Position: Sitting)   Pulse (!) 59   Temp 97.7 F (36.5 C) (Oral)   Ht 5' 0.5" (1.537 m)   Wt 111 lb 12.8 oz (50.7 kg)   SpO2 100%   BMI 21.48 kg/m    Wt Readings from Last 3 Encounters:  05/13/16 111 lb 12.8 oz (50.7 kg)  04/20/16 114 lb 4.8 oz (51.8 kg)  12/23/15 112 lb 11.2 oz (51.1 kg)

## 2016-05-13 NOTE — Progress Notes (Signed)
Radiation Oncology         [phone] ________________________________  Name: [name] [name] MRN: [id number]  Date: 05/13/2016  DOB: 01-May-1961    Follow-Up Visit Note  CC: Laray Anger, MD  Erroll Luna, MD    ICD-9-CM ICD-10-CM   1. Malignant neoplasm of upper-outer quadrant of left breast in female, estrogen receptor negative ([place]) 174.4 C50.412    V86.1 Z17.1     Diagnosis:   Clinical stage IIA high-grade invasive ductal carcinoma of the left breast  Interval Since Last Radiation: 8 months ; 08/06/2015-09/12/2015  Narrative:    [name] [name] here for follow up. She reports discomfort that she describes as "tightness". She states the pain feels more superficial than deep. She reports her left side is worse since radiation. This occurs during the day and it will rarely wake her up at night. She denies taking any topical cream for the pain. Her energy level is improving. She denies coughing or breathing problems. She will have a CT scan in May and will see Dr. Lindi Adie in April. Per nurse's exam, the skin on her left chest is intact.  ALLERGIES:  is allergic to meperidine; stadol [butorphanol]; zofran [ondansetron hcl]; and sulfa antibiotics.  Meds: Current Outpatient Prescriptions  Medication Sig Dispense Refill  . acetaminophen (TYLENOL) 325 MG tablet Take 650 mg by mouth every 6 (six) hours as needed for mild pain. Reported on 12/23/2015    . atenolol (TENORMIN) 25 MG tablet Take 75 mg by mouth daily. Reported on 10/23/2015    . Calcium Carbonate (CALCIUM 600 PO) Take 1 tablet by mouth daily.     . cholecalciferol (VITAMIN D) 1000 UNITS tablet Take 1,000 Units by mouth daily.    . Diclofenac Potassium 50 MG PACK Take 50 mg by mouth. Reported on 09/09/2015    . eletriptan (RELPAX) 40 MG tablet Take 1 tablet (40 mg total) by mouth every 2 (two) hours as needed for migraine or headache (Max 2 per day). 10 tablet 3  . ibuprofen (ADVIL,MOTRIN) 200 MG tablet Take 200 mg by mouth.  Reported on 12/23/2015    . Lactobacillus (ACIDOPHILUS PO) Take 1 capsule by mouth daily.     . Multiple Vitamins-Minerals (MULTIVITAMIN ADULT PO) Take by mouth.    Marland Kitchen PARoxetine (PAXIL) 20 MG tablet Take 20 mg by mouth.    . tolterodine (DETROL LA) 4 MG 24 hr capsule Take 4 mg by mouth.    . hydrocortisone 2.5 % ointment Apply topically 2 (two) times daily. (Patient not taking: Reported on 05/13/2016) 30 g 0  . promethazine (PHENERGAN) 12.5 MG tablet Take 1 tablet (12.5 mg total) by mouth every 6 (six) hours as needed for nausea or vomiting. (Patient not taking: Reported on 05/13/2016) 90 tablet 3   No current facility-administered medications for this encounter.     Physical Findings: The patient is in no acute distress. Patient is alert and oriented.  height is 5' 0.5" (1.537 m) and weight is 111 lb 12.8 oz (50.7 kg). Her oral temperature is 97.7 F (36.5 C). Her blood pressure is 108/81 and her pulse is 59 (abnormal). Her oxygen saturation is 100%. .  No palpable supraclavicular or axillary adenopathy. The lungs are clear to auscultation. The heart has a regular rhythm and rate. The abdomen is soft and nontender with normal bowel sounds.   Right chest: implant in place, no palpable or physical signs of recurrence.  Left chest: implant in place, possibly some capsular contraption. No palpable or  visible signs of recurrence.  Lab Findings: Lab Results  Component Value Date   WBC 4.0 10/23/2015   HGB 11.7 10/23/2015   HCT 35.6 10/23/2015   MCV 85.1 10/23/2015   PLT 186 10/23/2015    Radiographic Findings: No results found.  Impression:  The patient is recovering from the effects of radiation.  No signs of recurrence on clinical exam today.  Plan:  Prn follow-up in radiation oncology. Patient will continue to follow up with medical oncology. She also continues to see her Psychiatric nurse.   -----------------------------------  Blair Promise, PhD, MD   This document serves as  a record of services personally performed by Gery Pray, MD. It was created on his behalf by Bethann Humble, a trained medical scribe. The creation of this record is based on the scribe's personal observations and the provider's statements to them. This document has been checked and approved by the attending provider.

## 2016-06-30 NOTE — Progress Notes (Signed)
06/30/2016 Counseling intern emailed patient to offer counseling services if she is interested at this time. Counselor had contacted patient in September to offer continued counseling from previous intern. Patient declined counseling but advised she would call if anything changed.   Lamount Cohen, Counseling Intern Department for Spiritual Care and Wholeness Supervisor - Scientist, research (physical sciences).

## 2016-07-01 ENCOUNTER — Telehealth: Payer: Self-pay | Admitting: *Deleted

## 2016-07-01 NOTE — Telephone Encounter (Signed)
Received call from patient stating she found a lump in her right axilla and noticed this about 3 weeks ago.  Denies tenderness but states if feels like a large area.  I was able to get her scheduled at [facility] for today at 1pm for an u/s of that area.  Pt. Aware of appointment.  Will inform Dr. Lindi Adie.

## 2016-07-13 NOTE — Progress Notes (Signed)
Received [facility] breast US results. Sent to scan

## 2016-10-21 ENCOUNTER — Ambulatory Visit: Payer: BLUE CROSS/BLUE SHIELD | Admitting: Hematology and Oncology

## 2016-11-02 ENCOUNTER — Ambulatory Visit: Payer: BLUE CROSS/BLUE SHIELD | Admitting: Hematology and Oncology

## 2016-11-16 ENCOUNTER — Encounter: Payer: Self-pay | Admitting: Hematology and Oncology

## 2016-11-16 ENCOUNTER — Ambulatory Visit (HOSPITAL_BASED_OUTPATIENT_CLINIC_OR_DEPARTMENT_OTHER): Payer: BLUE CROSS/BLUE SHIELD | Admitting: Hematology and Oncology

## 2016-11-16 DIAGNOSIS — Z853 Personal history of malignant neoplasm of breast: Secondary | ICD-10-CM

## 2016-11-16 DIAGNOSIS — Z171 Estrogen receptor negative status [ER-]: Principal | ICD-10-CM

## 2016-11-16 DIAGNOSIS — R413 Other amnesia: Secondary | ICD-10-CM | POA: Diagnosis not present

## 2016-11-16 DIAGNOSIS — R5383 Other fatigue: Secondary | ICD-10-CM | POA: Diagnosis not present

## 2016-11-16 DIAGNOSIS — C50412 Malignant neoplasm of upper-outer quadrant of left female breast: Secondary | ICD-10-CM

## 2016-11-16 NOTE — Assessment & Plan Note (Signed)
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:00 position, left axillary lymph node palpable, ER/PR negative, 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 5.32, T1 cN1 M0 stage II a clinical stage Treatment summary: Neo adjuvant chemotherapy with [facility] for general every 3 weeks 6 cycles started 11/01/2014 completed 02/14/2015 Post-neo-adjuvant breast MRI 02/17/2015: No residual mass or abnormal enhancement, no enlarged lymph nodes Post-neo-adjuvant CT chest 02/18/2015: Interval resolution of the left breast mass and multiple enlarged left axillary and subpectoral lymph nodes Bilateral mastectomies11/21/2016 Left: Radial scar, FA, UDH no malignancy 0/1 lymph node negative; right: FA, radial scar, UDH no malignancy (pathologic complete response) [id number]  Herceptin maintenance completed April 2017 Adjuvant radiation therapy 08/06/2015- 09/12/15 ---------------------------------------------------------------------------------------------------------------------------------------------------- Current treatment:Surveillance Chemotherapy-induced anemia: monitoring hemoglobin 11.7  I wanted to get a CT scan of the chest to evaluate the pectoral nodes but had not been done.  Return to clinic in 6 months for surveillance and follow-up

## 2016-11-16 NOTE — Progress Notes (Signed)
Patient Care Team: Laray Anger, MD as PCP - General (Obstetrics and Gynecology) Erroll Luna, MD as Consulting Physician (General Surgery) Nicholas Lose, MD as Consulting Physician (Hematology and Oncology) Gery Pray, MD as Consulting Physician (Radiation Oncology) Mauro Kaufmann, RN as Registered Nurse Rockwell Germany, RN as Registered Nurse  DIAGNOSIS:  Encounter Diagnosis  Name Primary?  . Malignant neoplasm of upper-outer quadrant of left breast in female, estrogen receptor negative ([place])     SUMMARY OF ONCOLOGIC HISTORY:   Breast cancer of upper-outer quadrant of left female breast ([place])   10/01/2014 Mammogram    Left breast mammogram and ultrasound for palpable left breast mass which was a maxillary mass with an ill-defined abnormality by ultrasound 1.7 cm at 2:00      10/11/2014 Initial Diagnosis    Left breast invasive ductal carcinoma, grade 3, ER/PR negative, 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 5.32, axillary lymph node also positive for cancer      10/22/2014 Breast MRI    Left breast: 1.9 cm irr.enhancing mass in UOQ; 1.0 cm suspicious in UIQ. Right breast: 1.2 cm sl. Irr. enhancing mass in posterior third of UOQ. Multiple enlarged level 1 and 2 left axillary LN.      10/28/2014 Procedure    GT: no del mutations at APC, ATM, AXIN2, BARD1, BMPR1A, BRCA1, BRCA2, BRIP1, CDH1, CDK4, CDKN2A, CHEK2, EPCAM, FANCC, MLH1, MSH2, MSH6, MUTYH, NBN, PALB2, PMS2, POLD1, POLE, PTEN, RAD51C, RAD51D, SCG5/GREM1, SMAD4, STK11, TP53, VHL, and XRCC2      10/30/2014 Imaging    CT chest abdomen pelvis and bone scan revealed no evidence of distant metastatic disease but enlarged left axillary and left subpectoral lymph nodes      10/30/2014 Clinical Stage    Stage IIA: T1c N1      11/01/2014 - 02/14/2015 Neo-Adjuvant Chemotherapy    Neoadjuvant TCH [place] 6 cycles followed by Herceptin maintenance (completed 10/23/2015)      02/17/2015 Breast MRI    No residual mass or abnormal enhancement, no  enlarged lymph nodes      05/19/2015 Surgery    Bilateral mastectomies with immediate reconstruction Left: Radial scar, FA, UDH no malignancy 0/1 lymph node negative; right: FA, radial scar, UV H       05/19/2015 Pathologic Stage    complete path response: no malignancy ypT0 ypN0      08/06/2015 - 09/12/2015 Radiation Therapy    Adjuvant XRT: Left chest wall, axilla, SCV: 45 Gy over 25 fractions      12/23/2015 Survivorship    SCP visit completed and copy given to patient       CHIEF COMPLIANT: Surveillance of breast cancer  INTERVAL HISTORY: [name] [name] is a 56 year old with above-mentioned history of bilateral mastectomies for left-sided breast cancer was treated with neoadjuvant chemotherapy followed by Herceptin. She finished adjuvant radiation and is currently on surveillance. She is going to [facility] in [place] and has been placed on multiple supplements based on extensive lab work that they have performed. She denies any lumps or nodules. Energy levels have improved significantly.  REVIEW OF SYSTEMS:   Constitutional: Denies fevers, chills or abnormal weight loss Eyes: Denies blurriness of vision Ears, nose, mouth, throat, and face: Denies mucositis or sore throat Respiratory: Denies cough, dyspnea or wheezes Cardiovascular: Denies palpitation, chest discomfort Gastrointestinal:  Denies nausea, heartburn or change in bowel habits Skin: Denies abnormal skin rashes Lymphatics: Denies new lymphadenopathy or easy bruising Neurological:Denies numbness, tingling or new weaknesses Behavioral/Psych: Mood is  stable, no new changes  Extremities: No lower extremity edema Breast: Prior bilateral mastectomies All other systems were reviewed with the patient and are negative.  I have reviewed the past medical history, past surgical history, social history and family history with the patient and they are unchanged from previous note.  ALLERGIES:   is allergic to meperidine; stadol [butorphanol]; zofran [ondansetron hcl]; and sulfa antibiotics.  MEDICATIONS:  Current Outpatient Prescriptions  Medication Sig Dispense Refill  . acetaminophen (TYLENOL) 325 MG tablet Take 650 mg by mouth every 6 (six) hours as needed for mild pain. Reported on 12/23/2015    . atenolol (TENORMIN) 25 MG tablet Take 75 mg by mouth daily. Reported on 10/23/2015    . Calcium Carbonate (CALCIUM 600 PO) Take 1 tablet by mouth daily.     . cholecalciferol (VITAMIN D) 1000 UNITS tablet Take 1,000 Units by mouth daily.    . Diclofenac Potassium 50 MG PACK Take 50 mg by mouth. Reported on 09/09/2015    . eletriptan (RELPAX) 40 MG tablet Take 1 tablet (40 mg total) by mouth every 2 (two) hours as needed for migraine or headache (Max 2 per day). 10 tablet 3  . hydrocortisone 2.5 % ointment Apply topically 2 (two) times daily. (Patient not taking: Reported on 05/13/2016) 30 g 0  . ibuprofen (ADVIL,MOTRIN) 200 MG tablet Take 200 mg by mouth. Reported on 12/23/2015    . Lactobacillus (ACIDOPHILUS PO) Take 1 capsule by mouth daily.     . Multiple Vitamins-Minerals (MULTIVITAMIN ADULT PO) Take by mouth.    Marland Kitchen PARoxetine (PAXIL) 20 MG tablet Take 20 mg by mouth.    . promethazine (PHENERGAN) 12.5 MG tablet Take 1 tablet (12.5 mg total) by mouth every 6 (six) hours as needed for nausea or vomiting. (Patient not taking: Reported on 05/13/2016) 90 tablet 3  . tolterodine (DETROL LA) 4 MG 24 hr capsule Take 4 mg by mouth.     No current facility-administered medications for this visit.     PHYSICAL EXAMINATION: ECOG PERFORMANCE STATUS: 0 - Asymptomatic  Vitals:   11/16/16 0955  BP: 111/64  Pulse: 65  Resp: 18  Temp: 97.6 F (36.4 C)   Filed Weights   11/16/16 0955  Weight: 112 lb 11.2 oz (51.1 kg)    GENERAL:alert, no distress and comfortable SKIN: skin color, texture, turgor are normal, no rashes or significant lesions EYES: normal, Conjunctiva are pink and  non-injected, sclera clear OROPHARYNX:no exudate, no erythema and lips, buccal mucosa, and tongue normal  NECK: supple, thyroid normal size, non-tender, without nodularity LYMPH:  no palpable lymphadenopathy in the cervical, axillary or inguinal LUNGS: clear to auscultation and percussion with normal breathing effort HEART: regular rate & rhythm and no murmurs and no lower extremity edema ABDOMEN:abdomen soft, non-tender and normal bowel sounds MUSCULOSKELETAL:no cyanosis of digits and no clubbing  NEURO: alert & oriented x 3 with fluent speech, no focal motor/sensory deficits EXTREMITIES: No lower extremity edema   LABORATORY DATA:  I have reviewed the data as listed   Chemistry      Component Value Date/Time   NA 141 10/23/2015 0826   K 4.0 10/23/2015 0826   CL 98 (L) 05/20/2015 0300   CO2 29 10/23/2015 0826   BUN 19.6 10/23/2015 0826   CREATININE 0.8 10/23/2015 0826      Component Value Date/Time   CALCIUM 9.5 10/23/2015 0826   ALKPHOS 101 10/23/2015 0826   AST 39 (H) 10/23/2015 0826   ALT 37 10/23/2015 0826  BILITOT 0.46 10/23/2015 0826       Lab Results  Component Value Date   WBC 4.0 10/23/2015   HGB 11.7 10/23/2015   HCT 35.6 10/23/2015   MCV 85.1 10/23/2015   PLT 186 10/23/2015   NEUTROABS 2.7 10/23/2015    ASSESSMENT & PLAN:  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:00 position, left axillary lymph node palpable, ER/PR negative, 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 5.32, T1 cN1 M0 stage II a clinical stage Treatment summary: Neo adjuvant chemotherapy with [facility] for general every 3 weeks 6 cycles started 11/01/2014 completed 02/14/2015 Post-neo-adjuvant breast MRI 02/17/2015: No residual mass or abnormal enhancement, no enlarged lymph nodes Post-neo-adjuvant CT chest 02/18/2015: Interval resolution of the left breast mass and multiple enlarged left axillary and subpectoral lymph  nodes Bilateral mastectomies11/21/2016 Left: Radial scar, FA, UDH no malignancy 0/1 lymph node negative; right: FA, radial scar, UDH no malignancy (pathologic complete response) [id number]  Herceptin maintenance completed April 2017 Adjuvant radiation therapy 08/06/2015- 09/12/15 ---------------------------------------------------------------------------------------------------------------------------------------------------- Current treatment:Surveillance  I wanted to get a CT scan of the chest to evaluate the pectoral nodes but had not been done. We will try to schedule it. Long-term side effects: Patient complains of intermittent fatigue as well as memory problems. Patient is going to be admitted to [facility] in [place] and is on multiple supplements.  Return to clinic in 6 months for surveillance and follow-up  I spent 25 minutes talking to the patient of which more than half was spent in counseling and coordination of care.  No orders of the defined types were placed in this encounter.  The patient has a good understanding of the overall plan. she agrees with it. she will call with any problems that may develop before the next visit here.   Rulon Eisenmenger, MD 11/16/16

## 2016-12-07 ENCOUNTER — Encounter (HOSPITAL_COMMUNITY): Payer: Self-pay

## 2016-12-07 ENCOUNTER — Ambulatory Visit (HOSPITAL_COMMUNITY)
Admission: RE | Admit: 2016-12-07 | Discharge: 2016-12-07 | Disposition: A | Discharge status: Home / Self Care | Payer: BLUE CROSS/BLUE SHIELD | Source: Ambulatory Visit | Attending: Hematology and Oncology | Admitting: Hematology and Oncology

## 2016-12-07 DIAGNOSIS — Z9889 Other specified postprocedural states: Secondary | ICD-10-CM | POA: Diagnosis not present

## 2016-12-07 DIAGNOSIS — Z171 Estrogen receptor negative status [ER-]: Secondary | ICD-10-CM | POA: Diagnosis not present

## 2016-12-07 DIAGNOSIS — C50412 Malignant neoplasm of upper-outer quadrant of left female breast: Secondary | ICD-10-CM

## 2016-12-07 MED ORDER — IOPAMIDOL (ISOVUE-300) INJECTION 61%
INTRAVENOUS | Status: AC
  Filled 2016-12-07: qty 100

## 2016-12-07 MED ORDER — IOPAMIDOL (ISOVUE-300) INJECTION 61%
100.0000 mL | Freq: Once | INTRAVENOUS | Status: AC | PRN
  Administered 2016-12-07: 80 mL via INTRAVENOUS

## 2017-05-01 IMAGING — NM NM BONE WHOLE BODY
2 series · 2 of 2 positions shown · non-contrast
Comparison: CTs chest, abdomen and pelvis done today.

CLINICAL DATA: New diagnosis of right breast cancer staging.
Initial encounter.

EXAM:
NUCLEAR MEDICINE WHOLE BODY BONE SCAN
TECHNIQUE: Whole body anterior and posterior images were obtained approximately
3 hours after intravenous injection of radiopharmaceutical.
RADIOPHARMACEUTICALS:  26.9 mCi Cechnetium-22m MDP IV

[Series 1: wbr_bone_60 whole body · 2.66mm/px · 1 of 1 slices shown (1 of 2)]
[im 1/1]
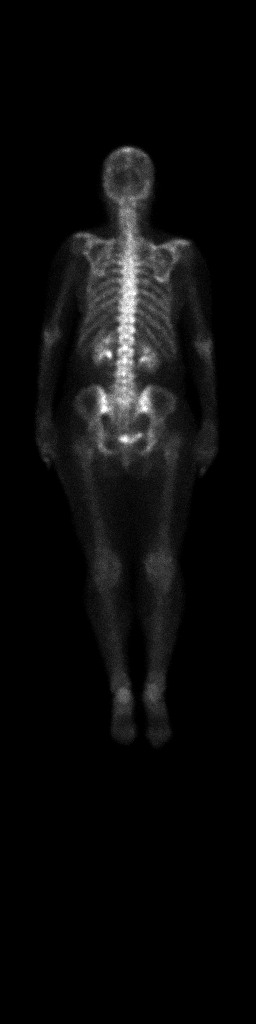

[Series 1: wbr_bone_60 whole body · 2.66mm/px · 1 of 1 slices shown (2 of 2)]
[im 1/1]
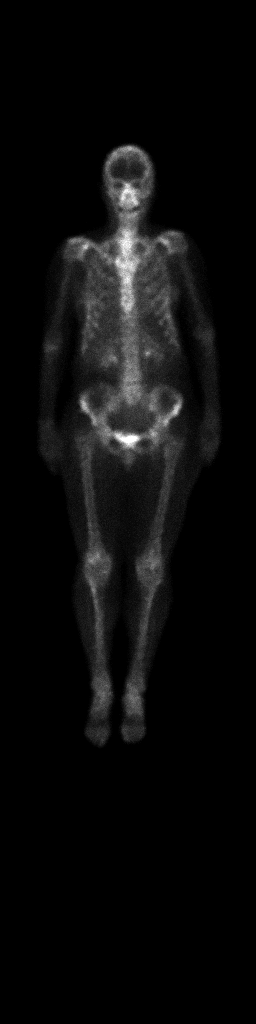

[2 of 2 positions shown; findings below may reference images not displayed]

FINDINGS: The osseous activity is within normal limits. There is no evidence
of metastatic disease. The soft tissue activity appears normal.
IMPRESSION: No evidence of osseous metastatic disease.

## 2017-05-01 IMAGING — CT CT ABD-PELV W/ CM
2 of 5 series · 16 of 46 positions shown, 18 images · IV contrast (OMNIPAQUE)
Comparison: Breast MRI 10/22/2014.

CLINICAL DATA: New diagnosis of left breast cancer. Staging.
Initial encounter. Next

EXAM:
CT CHEST, ABDOMEN, AND PELVIS WITH CONTRAST
TECHNIQUE: Multidetector CT imaging of the chest, abdomen and pelvis was
performed following the standard protocol during bolus
administration of intravenous contrast.
CONTRAST:  100mL OMNIPAQUE IOHEXOL 300 MG/ML  SOLN

[Series 2: cap with st · axial · 0.61mm/px · z∈[-558,-33]mm · 13 of 119 slices shown, 15 images]
[im 7/119  soft-tissue]
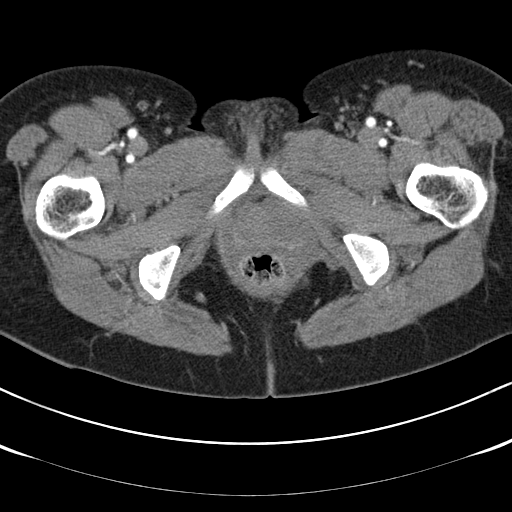
[im 7/119  bone]
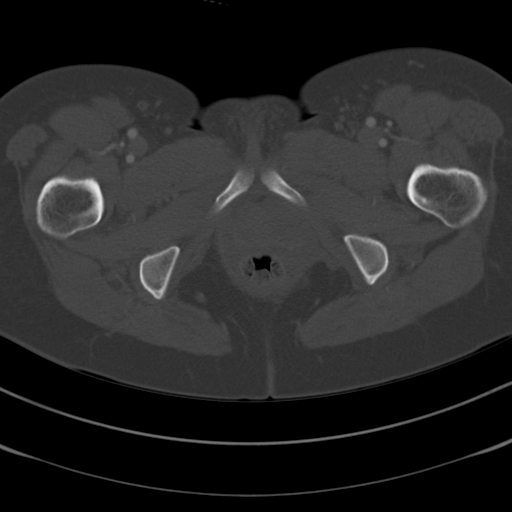
[im 14/119  soft-tissue]
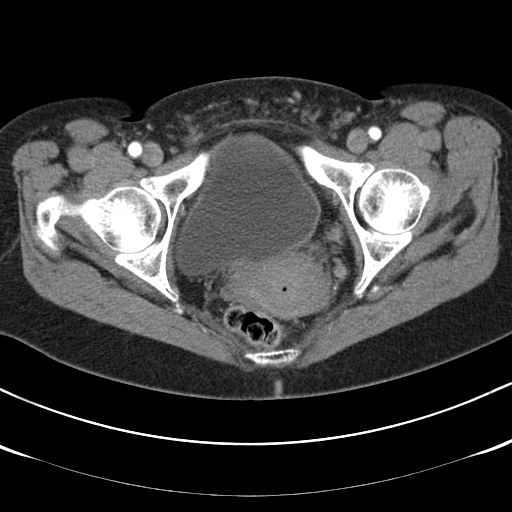
[im 28/119  soft-tissue]
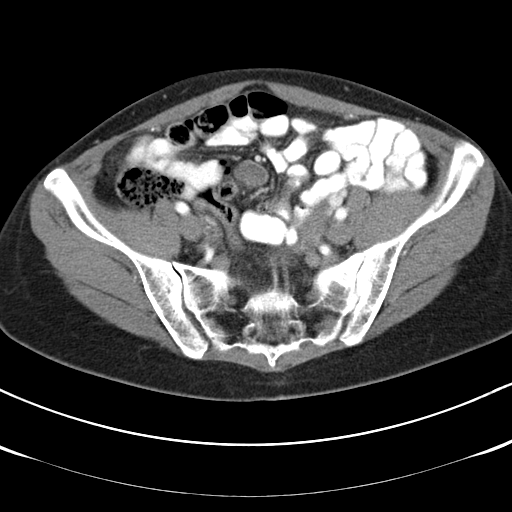
[im 35/119  soft-tissue]
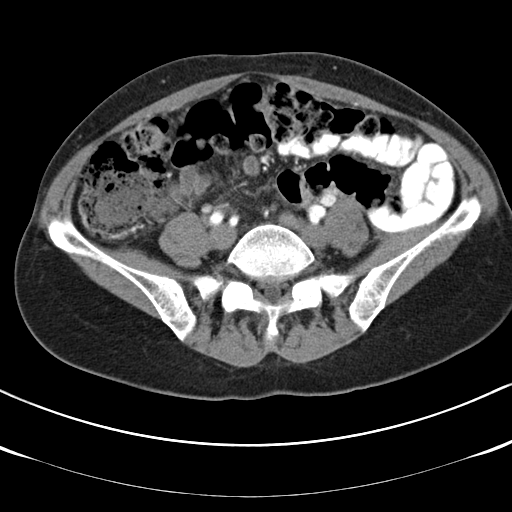
[im 42/119  soft-tissue]
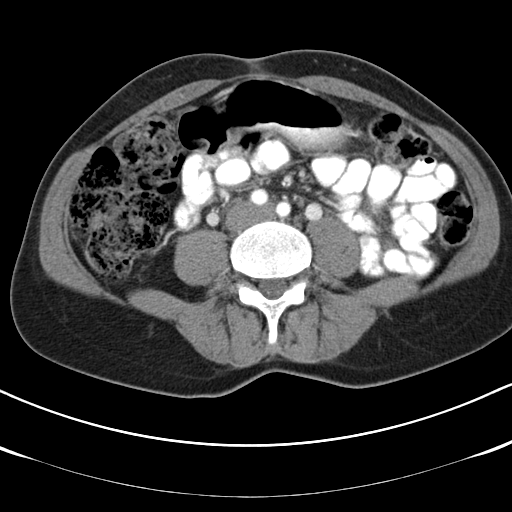
[im 49/119  soft-tissue]
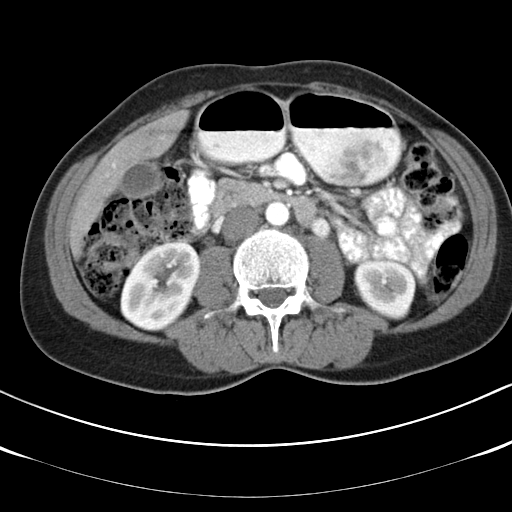
[im 63/119  soft-tissue]
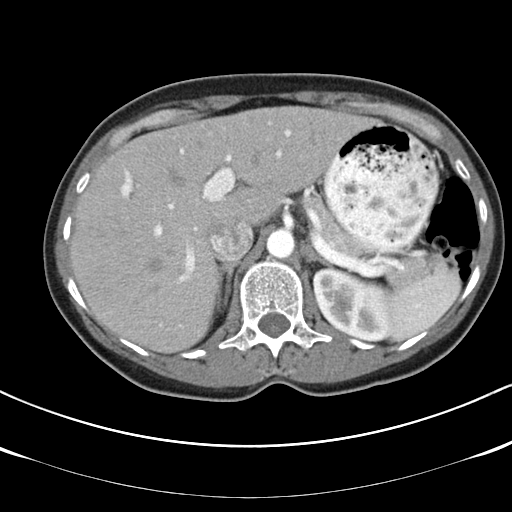
[im 70/119  soft-tissue]
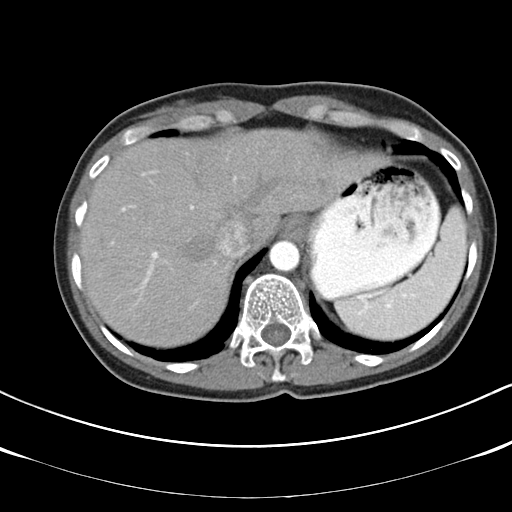
[im 77/119  soft-tissue]
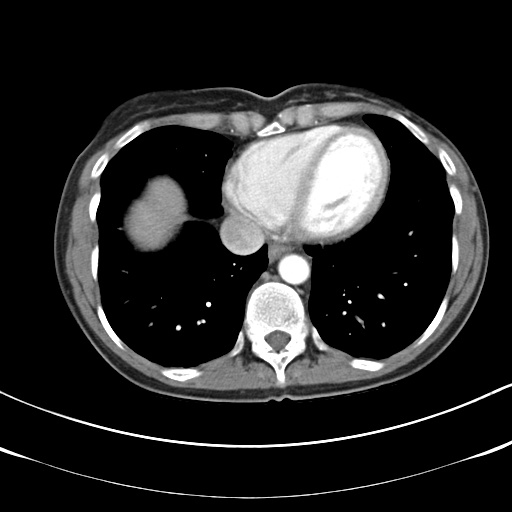
[im 77/119  bone]
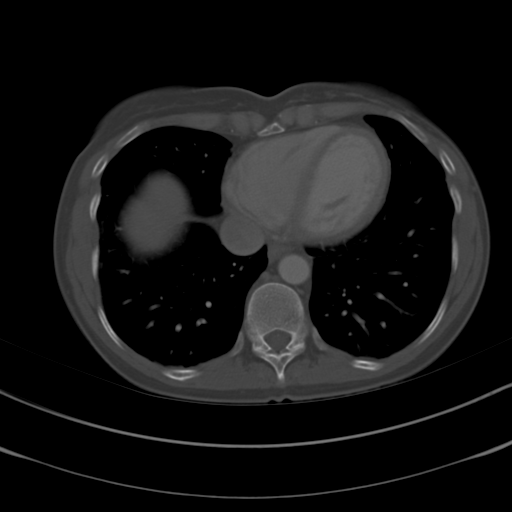
[im 84/119  soft-tissue]
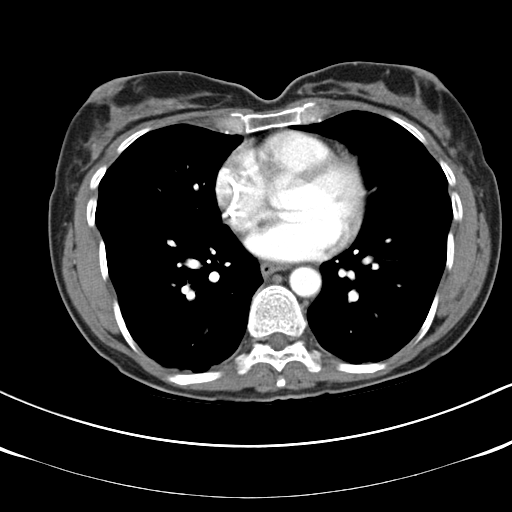
[im 91/119  soft-tissue]
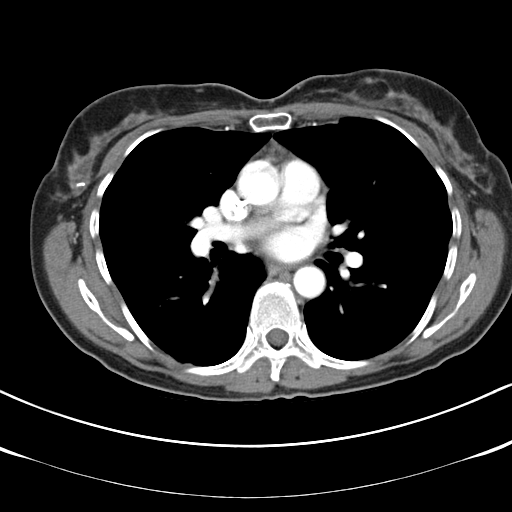
[im 105/119  soft-tissue]
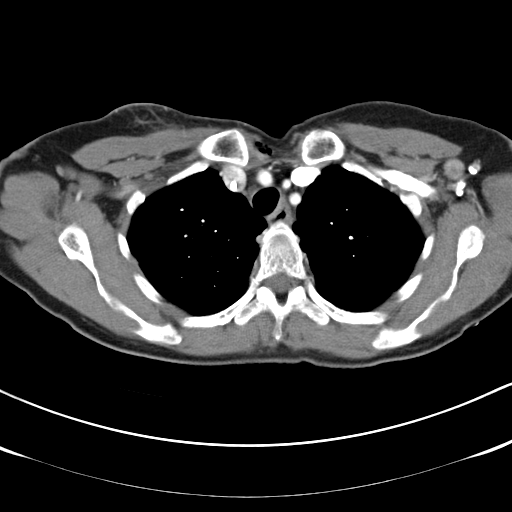
[im 112/119  soft-tissue]
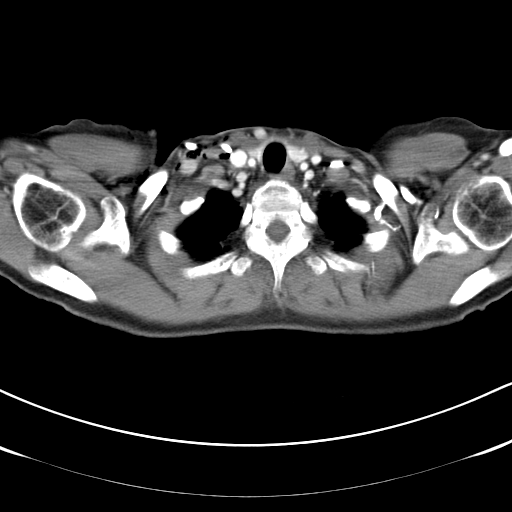

[Series 602: <mpr thick range> · coronal · 1.16mm/px · 3 of 72 slices shown]
[im 24/72  soft-tissue]
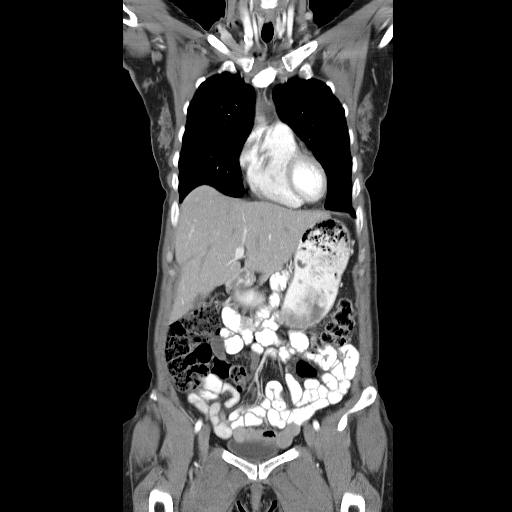
[im 32/72  soft-tissue]
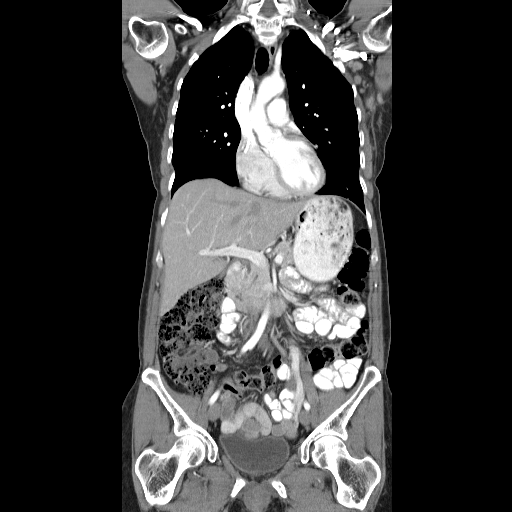
[im 40/72  soft-tissue]
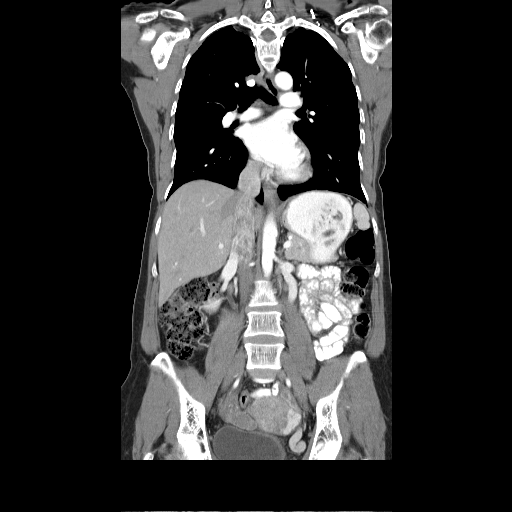

[16 of 46 positions shown; findings below may reference images not displayed]

FINDINGS: CT CHEST FINDINGS

Mediastinum/Nodes: There are no enlarged supraclavicular, internal
mammary, mediastinal or hilar lymph nodes. Multiple enlarged left
axillary and subpectoral lymph nodes are noted including an 11 mm
node on image number 10, a 13 mm node on image 15 and a 15 mm node
on image 24. The thyroid gland, trachea and esophagus demonstrate no
significant findings. The heart size is normal. There is no
pericardial effusion.There is soft tissue emphysema on the lower
right neck attributed to recent Port-A-Cath placement. Catheter tip
is at the SVC right atrial junction.

Lungs/Pleura: There is no pleural effusion.The lungs are clear.

Musculoskeletal/Chest wall: Asymmetric soft tissue mass laterally in
the left breast adjacent to biopsy clip measures 13 mm on image 27.
There is a small asymmetric nodular density in the upper inner
quadrant of the left breast which may correspond with the lesion
seen on MRI. There is a biopsy clip laterally in the right breast.
No worrisome osseous findings.

CT ABDOMEN AND PELVIS FINDINGS

Hepatobiliary: Adjacent cysts in the right hepatic lobe measure up
to 1.4 cm on image 54. No suspicious liver lesions. No evidence of
gallstones, gallbladder wall thickening or biliary dilatation.

Pancreas: Unremarkable. No pancreatic ductal dilatation or
surrounding inflammatory changes.

Spleen: Normal in size without focal abnormality.

Adrenals/Urinary Tract: Both adrenal glands appear normal.The
kidneys appear normal without evidence of urinary tract calculus,
suspicious lesion or hydronephrosis. No bladder abnormalities are
seen.

Stomach/Bowel: No evidence of bowel wall thickening, distention or
surrounding inflammatory change.The appendix appears normal. There
is moderate stool throughout the colon.

Vascular/Lymphatic: There are no enlarged abdominal or pelvic lymph
nodes. No significant arterial abnormalities are identified. There
are varices of the left ovarian vein.

Reproductive: The uterus and ovaries appear normal. Left ovarian
vein varices noted.

Other: No evidence of abdominal wall mass or hernia.

Musculoskeletal: No acute or significant osseous findings.
IMPRESSION: 1. Numerous enlarged left axillary and subpectoral lymph nodes
consistent with nodal metastases from the patient's left breast
cancer.
2. No mediastinal or internal mammary nodal involvement identified.
3. No evidence of distant metastasis.
4. Left ovarian vein varices.

## 2017-05-11 ENCOUNTER — Other Ambulatory Visit: Payer: Self-pay

## 2017-05-11 DIAGNOSIS — C50412 Malignant neoplasm of upper-outer quadrant of left female breast: Secondary | ICD-10-CM

## 2017-05-11 DIAGNOSIS — Z171 Estrogen receptor negative status [ER-]: Principal | ICD-10-CM

## 2017-05-12 ENCOUNTER — Telehealth: Payer: Self-pay | Admitting: Hematology and Oncology

## 2017-05-12 ENCOUNTER — Other Ambulatory Visit (HOSPITAL_BASED_OUTPATIENT_CLINIC_OR_DEPARTMENT_OTHER): Payer: BLUE CROSS/BLUE SHIELD

## 2017-05-12 ENCOUNTER — Ambulatory Visit (HOSPITAL_BASED_OUTPATIENT_CLINIC_OR_DEPARTMENT_OTHER): Payer: BLUE CROSS/BLUE SHIELD | Admitting: Hematology and Oncology

## 2017-05-12 DIAGNOSIS — R5383 Other fatigue: Secondary | ICD-10-CM

## 2017-05-12 DIAGNOSIS — R413 Other amnesia: Secondary | ICD-10-CM | POA: Diagnosis not present

## 2017-05-12 DIAGNOSIS — C50412 Malignant neoplasm of upper-outer quadrant of left female breast: Secondary | ICD-10-CM

## 2017-05-12 DIAGNOSIS — Z853 Personal history of malignant neoplasm of breast: Secondary | ICD-10-CM

## 2017-05-12 DIAGNOSIS — Z171 Estrogen receptor negative status [ER-]: Principal | ICD-10-CM

## 2017-05-12 LAB — COMPREHENSIVE METABOLIC PANEL
ALBUMIN: 4.2 g/dL (ref 3.5–5.0)
ALK PHOS: 93 U/L (ref 40–150)
ALT: 25 U/L (ref 0–55)
ANION GAP: 7 meq/L (ref 3–11)
AST: 27 U/L (ref 5–34)
BILIRUBIN TOTAL: 0.72 mg/dL (ref 0.20–1.20)
BUN: 16.3 mg/dL (ref 7.0–26.0)
CO2: 29 meq/L (ref 22–29)
Calcium: 9.6 mg/dL (ref 8.4–10.4)
Chloride: 103 mEq/L (ref 98–109)
Creatinine: 0.8 mg/dL (ref 0.6–1.1)
Glucose: 82 mg/dl (ref 70–140)
POTASSIUM: 4.1 meq/L (ref 3.5–5.1)
SODIUM: 139 meq/L (ref 136–145)
TOTAL PROTEIN: 7.1 g/dL (ref 6.4–8.3)

## 2017-05-12 LAB — CBC WITH DIFFERENTIAL/PLATELET
BASO%: 0.7 % (ref 0.0–2.0)
Basophils Absolute: 0 10*3/uL (ref 0.0–0.1)
EOS%: 1.7 % (ref 0.0–7.0)
Eosinophils Absolute: 0.1 10*3/uL (ref 0.0–0.5)
HEMATOCRIT: 39.5 % (ref 34.8–46.6)
HEMOGLOBIN: 13.2 g/dL (ref 11.6–15.9)
LYMPH#: 1.5 10*3/uL (ref 0.9–3.3)
LYMPH%: 30.8 % (ref 14.0–49.7)
MCH: 29.1 pg (ref 25.1–34.0)
MCHC: 33.4 g/dL (ref 31.5–36.0)
MCV: 87.2 fL (ref 79.5–101.0)
MONO#: 0.3 10*3/uL (ref 0.1–0.9)
MONO%: 6.6 % (ref 0.0–14.0)
NEUT#: 3 10*3/uL (ref 1.5–6.5)
NEUT%: 60.2 % (ref 38.4–76.8)
PLATELETS: 214 10*3/uL (ref 145–400)
RBC: 4.53 10*6/uL (ref 3.70–5.45)
RDW: 13 % (ref 11.2–14.5)
WBC: 5 10*3/uL (ref 3.9–10.3)

## 2017-05-12 NOTE — Assessment & Plan Note (Signed)
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:00 position, left axillary lymph node palpable, ER/PR negative, 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 5.32, T1 cN1 M0 stage II a clinical stage Treatment summary: Neo adjuvant chemotherapy with [facility] for general every 3 weeks 6 cycles started 11/01/2014 completed 02/14/2015 Post-neo-adjuvant breast MRI 02/17/2015: No residual mass or abnormal enhancement, no enlarged lymph nodes Post-neo-adjuvant CT chest 02/18/2015: Interval resolution of the left breast mass and multiple enlarged left axillary and subpectoral lymph nodes Bilateral mastectomies11/21/2016 Left: Radial scar, FA, UDH no malignancy 0/1 lymph node negative; right: FA, radial scar, UDH no malignancy (pathologic complete response) [id number] Herceptin maintenance completedApril 2017 Adjuvant radiation therapy 08/06/2015- 09/12/15 ---------------------------------------------------------------------------------------------------------------------------------------------------- Current treatment:Surveillance 12/07/2016: CT chest abdomen pelvis: No evidence of lymphadenopathy.  Long-term side effects: Patient complains of intermittent fatigue as well as memory problems.  Patient is going to be admitted to [facility] in [place] and is on multiple supplements.  Return to clinic in 1 year for surveillance and follow-up

## 2017-05-12 NOTE — Telephone Encounter (Signed)
Gave patient avs and calendar with appts per 11/15 los.  °

## 2017-05-12 NOTE — Progress Notes (Signed)
Patient Care Team: Podraza, Sindy Messing, PA-C as PCP - General (Physician Assistant) Erroll Luna, MD as Consulting Physician (General Surgery) Nicholas Lose, MD as Consulting Physician (Hematology and Oncology) Gery Pray, MD as Consulting Physician (Radiation Oncology) Mauro Kaufmann, RN as Registered Nurse Rockwell Germany, RN as Registered Nurse  DIAGNOSIS:  Encounter Diagnosis  Name Primary?  . Malignant neoplasm of upper-outer quadrant of left breast in female, estrogen receptor negative ([place])     SUMMARY OF ONCOLOGIC HISTORY:   Breast cancer of upper-outer quadrant of left female breast ([place])   10/01/2014 Mammogram    Left breast mammogram and ultrasound for palpable left breast mass which was a maxillary mass with an ill-defined abnormality by ultrasound 1.7 cm at 2:00      10/11/2014 Initial Diagnosis    Left breast invasive ductal carcinoma, grade 3, ER/PR negative, 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 5.32, axillary lymph node also positive for cancer      10/22/2014 Breast MRI    Left breast: 1.9 cm irr.enhancing mass in UOQ; 1.0 cm suspicious in UIQ. Right breast: 1.2 cm sl. Irr. enhancing mass in posterior third of UOQ. Multiple enlarged level 1 and 2 left axillary LN.      10/28/2014 Procedure    GT: no del mutations at APC, ATM, AXIN2, BARD1, BMPR1A, BRCA1, BRCA2, BRIP1, CDH1, CDK4, CDKN2A, CHEK2, EPCAM, FANCC, MLH1, MSH2, MSH6, MUTYH, NBN, PALB2, PMS2, POLD1, POLE, PTEN, RAD51C, RAD51D, SCG5/GREM1, SMAD4, STK11, TP53, VHL, and XRCC2      10/30/2014 Imaging    CT chest abdomen pelvis and bone scan revealed no evidence of distant metastatic disease but enlarged left axillary and left subpectoral lymph nodes      10/30/2014 Clinical Stage    Stage IIA: T1c N1      11/01/2014 - 02/14/2015 Neo-Adjuvant Chemotherapy    Neoadjuvant TCH Perjeta 6 cycles followed by Herceptin maintenance (completed 10/23/2015)      02/17/2015 Breast MRI    No residual mass or abnormal  enhancement, no enlarged lymph nodes      05/19/2015 Surgery    Bilateral mastectomies with immediate reconstruction Left: Radial scar, FA, UDH no malignancy 0/1 lymph node negative; right: FA, radial scar, UV H       05/19/2015 Pathologic Stage    complete path response: no malignancy ypT0 ypN0      08/06/2015 - 09/12/2015 Radiation Therapy    Adjuvant XRT: Left chest wall, axilla, SCV: 45 Gy over 25 fractions      12/23/2015 Survivorship    SCP visit completed and copy given to patient       CHIEF COMPLIANT: Surveillance of breast cancer  INTERVAL HISTORY: [name] [name] is a 56 year old with above-mentioned history of left breast cancer who underwent neoadjuvant chemotherapy and had a complete pathologic response and had radiation and is currently on surveillance.  She appears to be doing quite well.  She had bilateral mastectomies.  Denies any lumps or nodules in the breast.  Her energy levels have improved.  She attributes some of this to dietary changes especially eating healthy avoiding dairy and gluten-containing foods.  She is also not eating eggs.  REVIEW OF SYSTEMS:   Constitutional: Denies fevers, chills or abnormal weight loss Eyes: Denies blurriness of vision Ears, nose, mouth, throat, and face: Denies mucositis or sore throat Respiratory: Denies cough, dyspnea or wheezes Cardiovascular: Denies palpitation, chest discomfort Gastrointestinal: Complaining of indigestion Skin: Denies abnormal skin rashes Lymphatics: Denies new lymphadenopathy or easy bruising Neurological:Denies numbness, tingling  or new weaknesses Behavioral/Psych: Mood is stable, no new changes  Extremities: No lower extremity edema Breast:  denies any pain or lumps or nodules in either breasts All other systems were reviewed with the patient and are negative.  I have reviewed the past medical history, past surgical history, social history and family history with the patient and they are unchanged  from previous note.  ALLERGIES:  is allergic to meperidine; stadol [butorphanol]; zofran [ondansetron hcl]; and sulfa antibiotics.  MEDICATIONS:  Current Outpatient Medications  Medication Sig Dispense Refill  . acetaminophen (TYLENOL) 325 MG tablet Take 650 mg by mouth every 6 (six) hours as needed for mild pain. Reported on 12/23/2015    . atenolol (TENORMIN) 25 MG tablet Take 75 mg by mouth daily. Reported on 10/23/2015    . Calcium Carbonate (CALCIUM 600 PO) Take 1 tablet by mouth daily.     . cholecalciferol (VITAMIN D) 1000 UNITS tablet Take 1,000 Units by mouth daily.    . Diclofenac Potassium 50 MG PACK Take 50 mg by mouth. Reported on 09/09/2015    . eletriptan (RELPAX) 40 MG tablet Take 1 tablet (40 mg total) by mouth every 2 (two) hours as needed for migraine or headache (Max 2 per day). 10 tablet 3  . hydrocortisone 2.5 % ointment Apply topically 2 (two) times daily. (Patient not taking: Reported on 05/13/2016) 30 g 0  . ibuprofen (ADVIL,MOTRIN) 200 MG tablet Take 200 mg by mouth. Reported on 12/23/2015    . Lactobacillus (ACIDOPHILUS PO) Take 1 capsule by mouth daily.     . Multiple Vitamins-Minerals (MULTIVITAMIN ADULT PO) Take by mouth.    Marland Kitchen PARoxetine (PAXIL) 20 MG tablet Take 20 mg by mouth.    . promethazine (PHENERGAN) 12.5 MG tablet Take 1 tablet (12.5 mg total) by mouth every 6 (six) hours as needed for nausea or vomiting. (Patient not taking: Reported on 05/13/2016) 90 tablet 3   No current facility-administered medications for this visit.     PHYSICAL EXAMINATION: ECOG PERFORMANCE STATUS: 1 - Symptomatic but completely ambulatory  Vitals:   05/12/17 1104  BP: 105/65  Pulse: 62  Resp: 20  Temp: 97.8 F (36.6 C)  SpO2: 99%   Filed Weights   05/12/17 1104  Weight: 111 lb (50.3 kg)    GENERAL:alert, no distress and comfortable SKIN: skin color, texture, turgor are normal, no rashes or significant lesions EYES: normal, Conjunctiva are pink and non-injected,  sclera clear OROPHARYNX:no exudate, no erythema and lips, buccal mucosa, and tongue normal  NECK: supple, thyroid normal size, non-tender, without nodularity LYMPH:  no palpable lymphadenopathy in the cervical, axillary or inguinal LUNGS: clear to auscultation and percussion with normal breathing effort HEART: regular rate & rhythm and no murmurs and no lower extremity edema ABDOMEN:abdomen soft, non-tender and normal bowel sounds MUSCULOSKELETAL:no cyanosis of digits and no clubbing  NEURO: alert & oriented x 3 with fluent speech, no focal motor/sensory deficits EXTREMITIES: No lower extremity edema BREAST: Bilateral mastectomies with reconstruction. (exam performed in the presence of a chaperone)  LABORATORY DATA:  I have reviewed the data as listed   Chemistry      Component Value Date/Time   NA 139 05/12/2017 1037   K 4.1 05/12/2017 1037   CL 98 (L) 05/20/2015 0300   CO2 29 05/12/2017 1037   BUN 16.3 05/12/2017 1037   CREATININE 0.8 05/12/2017 1037      Component Value Date/Time   CALCIUM 9.6 05/12/2017 1037   ALKPHOS 93 05/12/2017 1037  AST 27 05/12/2017 1037   ALT 25 05/12/2017 1037   BILITOT 0.72 05/12/2017 1037       Lab Results  Component Value Date   WBC 5.0 05/12/2017   HGB 13.2 05/12/2017   HCT 39.5 05/12/2017   MCV 87.2 05/12/2017   PLT 214 05/12/2017   NEUTROABS 3.0 05/12/2017    ASSESSMENT & PLAN:  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:00 position, left axillary lymph node palpable, ER/PR negative, 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 5.32, T1 cN1 M0 stage II a clinical stage Treatment summary: Neo adjuvant chemotherapy with Iraan for general every 3 weeks 6 cycles started 11/01/2014 completed 02/14/2015 Post-neo-adjuvant breast MRI 02/17/2015: No residual mass or abnormal enhancement, no enlarged lymph nodes Post-neo-adjuvant CT chest 02/18/2015: Interval resolution of the left  breast mass and multiple enlarged left axillary and subpectoral lymph nodes Bilateral mastectomies11/21/2016 Left: Radial scar, FA, UDH no malignancy 0/1 lymph node negative; right: FA, radial scar, UDH no malignancy (pathologic complete response) [id number] Herceptin maintenance completedApril 2017 Adjuvant radiation therapy 08/06/2015- 09/12/15 ---------------------------------------------------------------------------------------------------------------------------------------------------- Current treatment:Surveillance  12/07/2016: CT chest abdomen pelvis: No evidence of lymphadenopathy.  Long-term side effects: Patient complains of intermittent fatigue as well as memory problems. Patient is going to [facility] in [place] and is on multiple supplements. Indigestion: Patient is struggling between her primary care physician and integrative medicine figuring out how to best treat her indigestion. I had a lengthy discussion with her about some of the detrimental side effects of taking supplements.  Return to clinic in 1 year for surveillance and follow-up  I spent 25 minutes talking to the patient of which more than half was spent in counseling and coordination of care.  No orders of the defined types were placed in this encounter.  The patient has a good understanding of the overall plan. she agrees with it. she will call with any problems that may develop before the next visit here.   Rulon Eisenmenger, MD 05/12/17

## 2017-05-17 ENCOUNTER — Other Ambulatory Visit: Payer: BLUE CROSS/BLUE SHIELD

## 2017-05-17 ENCOUNTER — Ambulatory Visit: Payer: BLUE CROSS/BLUE SHIELD | Admitting: Hematology and Oncology

## 2017-11-25 ENCOUNTER — Inpatient Hospital Stay: Payer: BLUE CROSS/BLUE SHIELD | Attending: Medical | Admitting: Medical

## 2017-11-25 ENCOUNTER — Telehealth: Payer: Self-pay

## 2017-11-25 VITALS — BP 111/66 | HR 58 | Temp 98.2°F | Resp 18 | Ht 60.5 in | Wt 116.3 lb

## 2017-11-25 DIAGNOSIS — Z9013 Acquired absence of bilateral breasts and nipples: Secondary | ICD-10-CM | POA: Insufficient documentation

## 2017-11-25 DIAGNOSIS — C50412 Malignant neoplasm of upper-outer quadrant of left female breast: Secondary | ICD-10-CM | POA: Insufficient documentation

## 2017-11-25 DIAGNOSIS — L03114 Cellulitis of left upper limb: Secondary | ICD-10-CM

## 2017-11-25 DIAGNOSIS — Z853 Personal history of malignant neoplasm of breast: Secondary | ICD-10-CM

## 2017-11-25 DIAGNOSIS — Z171 Estrogen receptor negative status [ER-]: Secondary | ICD-10-CM | POA: Diagnosis not present

## 2017-11-25 MED ORDER — DOXYCYCLINE HYCLATE 100 MG PO TABS: 100.0000 mg | ORAL_TABLET | Freq: Two times a day (BID) | ORAL | 0 refills | Status: AC

## 2017-11-25 NOTE — Patient Instructions (Signed)

## 2017-11-25 NOTE — Progress Notes (Signed)
[facility] Progress Note   [name] [name] [id number] 1961/04/24 57 y.o.  Thandiwe P Record is managed by Dr. Nicholas Lose  Actively treated with chemotherapy: no   Assessment: Plan:    Cellulitis of left upper extremity - Plan: doxycycline (VIBRA-TABS) 100 MG tablet  Please see After Visit Summary for patient specific instructions.  Future Appointments  Date Time Provider [place]  05/18/2018  9:30 AM [facility] LAB 5 [facility] None  05/18/2018 10:00 AM Nicholas Lose, MD [facility] None    No orders of the defined types were placed in this encounter.      Subjective:   Patient ID:  [name] [name] is a 56 y.o. (DOB [birth year]) female.  Chief Complaint: No chief complaint on file.   HPI [name] [name] is a 57 year old female with a history of an ER negative malignant neoplasm of the upper outer quadrant of the left breast who is status post bilateral mastectomies with 2 lymph nodes removed from the left axilla.  The patient reports noting itching and mild erythema in her left anterior upper extremity. She believes that she had an insect bite. She denies fever, chill, and sweats.   Medications: I have reviewed the patient's current medications.  Allergies:  Allergies  Allergen Reactions  . Meperidine Nausea And Vomiting  . Stadol [Butorphanol] Nausea And Vomiting  . Zofran [Ondansetron Hcl] Other (See Comments)    Makes migraine headaches intensify   . Sulfa Antibiotics Rash    Past Medical History:  Diagnosis Date  . Breast cancer ([place]) 2016   ER-/PR-/Her2+  . Breast cancer of upper-outer quadrant of right female breast ([place]) 10/11/2014  . Family history of breast cancer   . Family history of colon cancer   . Migraines    takes metoprolol  for migraine headaches  . Neurogenic bladder   . Osteopenia   . Radiation 08/06/15-09/12/15   left chest wall axillary and supraclavicular region 45 gray  . Wears contact lenses     Past  Surgical History:  Procedure Laterality Date  . BREAST RECONSTRUCTION WITH PLACEMENT OF TISSUE EXPANDER AND FLEX HD (ACELLULAR HYDRATED DERMIS) Bilateral 05/19/2015   Procedure: BILATERAL NIPPLE SPARING MASTECTOMY WITH LEFT AXILLARY SENTINAL LYMPH NODE BIOPSY;  Surgeon: Erroll Luna, MD;  Location: [facility];  Service: General;  Laterality: Bilateral;  . BREAST SURGERY     rt, breast biopsy  . COLONOSCOPY    . NIPPLE SPARING MASTECTOMY/SENTINAL LYMPH NODE BIOPSY/RECONSTRUCTION/PLACEMENT OF TISSUE EXPANDER Bilateral 05/19/2015   Procedure: BILATERAL NIPPLE SPARING MASTECTOMY WITH LEFT AXILLARY SENTINAL LYMPH NODE BIOPSY;  Surgeon: Erroll Luna, MD;  Location: [facility];  Service: General;  Laterality: Bilateral;  . PORTACATH PLACEMENT Right 10/29/2014   Procedure: INSERTION PORT-A-CATH WITH ULTRASOUND;  Surgeon: Erroll Luna, MD;  Location: [facility];  Service: General;  Laterality: Right;  . UPPER GI ENDOSCOPY    . VEIN LIGATION     both legs    Family History  Problem Relation Age of Onset  . Brain cancer Mother 44       Glioblastoma  . Colon cancer Father 65  . Colon cancer Maternal Uncle 47  . Lung cancer Paternal Aunt   . Lung cancer Maternal Grandfather 90  . Lung cancer Paternal Grandfather 67  . Melanoma Brother 64       insitu  . Rheumatic fever Maternal Grandmother        in her 41s  . Breast cancer Paternal Grandmother 30  Social History   Socioeconomic History  . Marital status: Married    Spouse name: Not on file  . Number of children: 3  . Years of education: Not on file  . Highest education level: Not on file  Occupational History  . Occupation: Horticulturist, commercial  Social Needs  . Financial resource strain: Not on file  . Food insecurity:    Worry: Not on file    Inability: Not on file  . Transportation needs:    Medical: Not on file    Non-medical: Not on file  Tobacco Use  . Smoking status: Never Smoker  . Smokeless tobacco: Never  Used  Substance and Sexual Activity  . Alcohol use: Yes    Comment: 4-5  . Drug use: No  . Sexual activity: Not on file  Lifestyle  . Physical activity:    Days per week: Not on file    Minutes per session: Not on file  . Stress: Not on file  Relationships  . Social connections:    Talks on phone: Not on file    Gets together: Not on file    Attends religious service: Not on file    Active member of club or organization: Not on file    Attends meetings of clubs or organizations: Not on file    Relationship status: Not on file  . Intimate partner violence:    Fear of current or ex partner: Not on file    Emotionally abused: Not on file    Physically abused: Not on file    Forced sexual activity: Not on file  Other Topics Concern  . Not on file  Social History Narrative  . Not on file    Past Medical History, Surgical history, Social history, and Family history were reviewed and updated as appropriate.   Please see review of systems for further details on the patient's review from today.   Review of Systems:  Review of Systems  Constitutional: Negative for chills, diaphoresis and fever.  Skin: Positive for color change and rash.    Objective:   Physical Exam:  BP 111/66 (BP Location: Right Arm, Patient Position: Sitting)   Pulse (!) 58 Comment: Adel Burch PA is aware  Temp 98.2 F (36.8 C) (Oral)   Resp 18   Ht 5' 0.5" (1.537 m)   Wt 116 lb 4.8 oz (52.8 kg)   SpO2 100%   BMI 22.34 kg/m  ECOG: 0  Physical Exam  Constitutional: No distress.  HENT:  Head: Normocephalic and atraumatic.  Cardiovascular: Normal rate, regular rhythm and normal heart sounds. Exam reveals no friction rub.  No murmur heard. Pulmonary/Chest: Effort normal and breath sounds normal. No respiratory distress. She has no wheezes. She has no rales.  Skin: She is not diaphoretic. There is erythema (Diffuse erythema with a small central area of induration over the left anterior upper extremity.).   Psychiatric: She has a normal mood and affect. Her behavior is normal. Judgment and thought content normal.      Lab Review:     Component Value Date/Time   NA 139 05/12/2017 1037   K 4.1 05/12/2017 1037   CL 98 (L) 05/20/2015 0300   CO2 29 05/12/2017 1037   GLUCOSE 82 05/12/2017 1037   BUN 16.3 05/12/2017 1037   CREATININE 0.8 05/12/2017 1037   CALCIUM 9.6 05/12/2017 1037   PROT 7.1 05/12/2017 1037   ALBUMIN 4.2 05/12/2017 1037   AST 27 05/12/2017 1037   ALT 25 05/12/2017  1037   ALKPHOS 93 05/12/2017 1037   BILITOT 0.72 05/12/2017 1037   GFRNONAA >60 05/20/2015 0300   GFRAA >60 05/20/2015 0300       Component Value Date/Time   WBC 5.0 05/12/2017 1037   WBC 9.3 05/20/2015 0300   RBC 4.53 05/12/2017 1037   RBC 3.00 (L) 05/20/2015 0300   HGB 13.2 05/12/2017 1037   HCT 39.5 05/12/2017 1037   PLT 214 05/12/2017 1037   MCV 87.2 05/12/2017 1037   MCH 29.1 05/12/2017 1037   MCH 28.3 05/20/2015 0300   MCHC 33.4 05/12/2017 1037   MCHC 31.6 05/20/2015 0300   RDW 13.0 05/12/2017 1037   LYMPHSABS 1.5 05/12/2017 1037   MONOABS 0.3 05/12/2017 1037   EOSABS 0.1 05/12/2017 1037   BASOSABS 0.0 05/12/2017 1037   -------------------------------  Imaging from last 24 hours (if applicable):  Radiology interpretation: No results found.

## 2017-11-25 NOTE — Progress Notes (Signed)
Pt seen by PA Lucianne Lei only for c/o lymphadema.  No RN assessment performed.

## 2017-11-25 NOTE — Telephone Encounter (Signed)
Returning pt's call, she reports she was trying to call symptom management to be seen for left arm swelling from elbow- up.  Reports it is red, swollen, and some numbness. Denies fever.   Reports on Wednesday when it started she initially had itching and thought she had a mosquito bite, says the itching feels like it is deep inside her arm and now, she thinks it is lymphedema. Pt agreed to symptom management today at 3:45 pm- notified Sandi Mealy PA and he said ok to put on his schedule, sent inbasket to scheduling.

## 2018-05-15 NOTE — Progress Notes (Signed)
Patient Care Team: Jonathon Bellows, PA-C as PCP - General (Physician Assistant) Erroll Luna, MD as Consulting Physician (General Surgery) Nicholas Lose, MD as Consulting Physician (Hematology and Oncology) Gery Pray, MD as Consulting Physician (Radiation Oncology) Mauro Kaufmann, RN as Registered Nurse Rockwell Germany, RN as Registered Nurse  DIAGNOSIS:    ICD-10-CM   1. Post-menopausal Z78.0 DG Bone Density  2. Malignant neoplasm of upper-outer quadrant of left breast in female, estrogen receptor negative ([place]) C50.412 CT Abdomen Pelvis W Contrast   Z17.1 CT Chest W Contrast    SUMMARY OF ONCOLOGIC HISTORY:   Breast cancer of upper-outer quadrant of left female breast ([place])   10/01/2014 Mammogram    Left breast mammogram and ultrasound for palpable left breast mass which was a maxillary mass with an ill-defined abnormality by ultrasound 1.7 cm at 2:00    10/11/2014 Initial Diagnosis    Left breast invasive ductal carcinoma, grade 3, ER/PR negative, 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 5.32, axillary lymph node also positive for cancer    10/22/2014 Breast MRI    Left breast: 1.9 cm irr.enhancing mass in UOQ; 1.0 cm suspicious in UIQ. Right breast: 1.2 cm sl. Irr. enhancing mass in posterior third of UOQ. Multiple enlarged level 1 and 2 left axillary LN.    10/28/2014 Procedure    GT: no del mutations at APC, ATM, AXIN2, BARD1, BMPR1A, BRCA1, BRCA2, BRIP1, CDH1, CDK4, CDKN2A, CHEK2, EPCAM, FANCC, MLH1, MSH2, MSH6, MUTYH, NBN, PALB2, PMS2, POLD1, POLE, PTEN, RAD51C, RAD51D, SCG5/GREM1, SMAD4, STK11, TP53, VHL, and XRCC2    10/30/2014 Imaging    CT chest abdomen pelvis and bone scan revealed no evidence of distant metastatic disease but enlarged left axillary and left subpectoral lymph nodes    10/30/2014 Clinical Stage    Stage IIA: T1c N1    11/01/2014 - 02/14/2015 Neo-Adjuvant Chemotherapy    Neoadjuvant TCH [place] 6 cycles followed by Herceptin maintenance (completed 10/23/2015)    02/17/2015 Breast MRI    No residual mass or abnormal enhancement, no enlarged lymph nodes    05/19/2015 Surgery    Bilateral mastectomies with immediate reconstruction Left: Radial scar, FA, UDH no malignancy 0/1 lymph node negative; right: FA, radial scar, UV H     05/19/2015 Pathologic Stage    complete path response: no malignancy ypT0 ypN0    08/06/2015 - 09/12/2015 Radiation Therapy    Adjuvant XRT: Left chest wall, axilla, SCV: 45 Gy over 25 fractions    12/23/2015 Survivorship    SCP visit completed and copy given to patient     CHIEF COMPLIANT: Follow up of left breast cancer  INTERVAL HISTORY: [name] [name] is a 57 y.o. with above-mentioned history of left breast cancer who underwent neoadjuvant chemotherapy and had a complete pathologic response, had radiation, and underwent bilateral mastectomies and is currently on surveillance. A CT CAP on 12/07/16 showed no evidence of mass or adenopathy. I last saw the patient one year ago.  She presents to the clinic today alone and reports she had a concussion since I last saw her that resulted from a fall on a hike. She denies any lumps or nodules in her breasts but notes a scaly, itchy rash on her left upper chest for which she will see her dermatologist. The patient notes mild memory issues. She reports low weight bearing exercise 3-5 times per week and feels well overall, despite a decrease in stamina and fatigue at night. She has concern about sub-pectoral lymph nodes and bone density  and would like to consider getting new scans in a year before I see her next but notes a change in insurance which would require her to pay out of pocket. She reviewed her medications with me and said she is taking calcium-free supplements of magnesium and seeing a holistic NP in [place].  REVIEW OF SYSTEMS:   Constitutional: Denies fevers, chills or abnormal weight loss (+) fatigue Eyes: Denies blurriness of vision Ears, nose, mouth, throat, and  face: Denies mucositis or sore throat Respiratory: Denies cough, dyspnea or wheezes Cardiovascular: Denies palpitation, chest discomfort Gastrointestinal:  Denies nausea, heartburn or change in bowel habits Skin: (+) scaly, itchy rash on left upper chest Lymphatics: Denies new lymphadenopathy or easy bruising Neurological:Denies numbness, tingling or new weaknesses  Behavioral/Psych: Mood is stable, no new changes  Extremities: No lower extremity edema Breast: denies any pain or lumps or nodules in either breasts All other systems were reviewed with the patient and are negative.  I have reviewed the past medical history, past surgical history, social history and family history with the patient and they are unchanged from previous note.  ALLERGIES:  is allergic to meperidine; stadol [butorphanol]; zofran [ondansetron hcl]; and sulfa antibiotics.  MEDICATIONS:  Current Outpatient Medications  Medication Sig Dispense Refill  . acetaminophen (TYLENOL) 325 MG tablet Take 650 mg by mouth every 6 (six) hours as needed for mild pain. Reported on 12/23/2015    . atenolol (TENORMIN) 25 MG tablet Take 75 mg by mouth daily. Reported on 10/23/2015    . Calcium Carbonate (CALCIUM 600 PO) Take 1 tablet by mouth daily.     . cholecalciferol (VITAMIN D) 1000 UNITS tablet Take 1,000 Units by mouth daily.    . Diclofenac Potassium 50 MG PACK Take 50 mg by mouth. Reported on 09/09/2015    . doxycycline (VIBRA-TABS) 100 MG tablet Take 1 tablet (100 mg total) by mouth 2 (two) times daily. 14 tablet 0  . eletriptan (RELPAX) 40 MG tablet Take 1 tablet (40 mg total) by mouth every 2 (two) hours as needed for migraine or headache (Max 2 per day). 10 tablet 3  . hydrocortisone 2.5 % ointment Apply topically 2 (two) times daily. (Patient not taking: Reported on 05/13/2016) 30 g 0  . ibuprofen (ADVIL,MOTRIN) 200 MG tablet Take 200 mg by mouth. Reported on 12/23/2015    . Lactobacillus (ACIDOPHILUS PO) Take 1 capsule by  mouth daily.     . Multiple Vitamins-Minerals (MULTIVITAMIN ADULT PO) Take by mouth.    Marland Kitchen PARoxetine (PAXIL) 20 MG tablet Take 20 mg by mouth.    . promethazine (PHENERGAN) 12.5 MG tablet Take 1 tablet (12.5 mg total) by mouth every 6 (six) hours as needed for nausea or vomiting. (Patient not taking: Reported on 05/13/2016) 90 tablet 3   No current facility-administered medications for this visit.     PHYSICAL EXAMINATION: ECOG PERFORMANCE STATUS: 1 - Symptomatic but completely ambulatory  Vitals:   05/18/18 1011  BP: 116/70  Pulse: 64  Resp: 18  Temp: 98.5 F (36.9 C)  SpO2: 100%   Filed Weights   05/18/18 1011  Weight: 115 lb 6.4 oz (52.3 kg)    GENERAL:alert, no distress and comfortable SKIN: skin color, texture, turgor are normal, no rashes or significant lesions EYES: normal, Conjunctiva are pink and non-injected, sclera clear OROPHARYNX:no exudate, no erythema and lips, buccal mucosa, and tongue normal  NECK: supple, thyroid normal size, non-tender, without nodularity LYMPH:  no palpable lymphadenopathy in the cervical, axillary or  inguinal LUNGS: clear to auscultation and percussion with normal breathing effort HEART: regular rate & rhythm and no murmurs and no lower extremity edema ABDOMEN:abdomen soft, non-tender and normal bowel sounds MUSCULOSKELETAL:no cyanosis of digits and no clubbing  NEURO: alert & oriented x 3 with fluent speech, no focal motor/sensory deficits EXTREMITIES: No lower extremity edema BREAST: No palpable masses or nodules in either right or left breasts. No palpable axillary supraclavicular or infraclavicular adenopathy no breast tenderness or nipple discharge. (exam performed in the presence of a chaperone)  LABORATORY DATA:  I have reviewed the data as listed CMP Latest Ref Rng & Units 05/12/2017 10/23/2015 10/02/2015  Glucose 70 - 140 mg/dl 82 73 108  BUN 7.0 - 26.0 mg/dL 16.3 19.6 17.3  Creatinine 0.6 - 1.1 mg/dL 0.8 0.8 0.8  Sodium 136 -  145 mEq/L 139 141 143  Potassium 3.5 - 5.1 mEq/L 4.1 4.0 4.3  Chloride 101 - 111 mmol/L - - -  CO2 22 - 29 mEq/L 29 29 31(H)  Calcium 8.4 - 10.4 mg/dL 9.6 9.5 9.5  Total Protein 6.4 - 8.3 g/dL 7.1 6.6 6.7  Total Bilirubin 0.20 - 1.20 mg/dL 0.72 0.46 0.48  Alkaline Phos 40 - 150 U/L 93 101 84  AST 5 - 34 U/L 27 39(H) 26  ALT 0 - 55 U/L 25 37 27    Lab Results  Component Value Date   WBC 5.2 05/18/2018   HGB 13.3 05/18/2018   HCT 40.6 05/18/2018   MCV 90.4 05/18/2018   PLT 210 05/18/2018   NEUTROABS 3.3 05/18/2018    ASSESSMENT & PLAN:  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:00 position, left axillary lymph node palpable, ER/PR negative, 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 5.32, T1 cN1 M0 stage II a clinical stage Treatment summary: Neo adjuvant chemotherapy with [place] for general every 3 weeks 6 cycles started 11/01/2014 completed 02/14/2015 Post-neo-adjuvant breast MRI 02/17/2015: No residual mass or abnormal enhancement, no enlarged lymph nodes Post-neo-adjuvant CT chest 02/18/2015: Interval resolution of the left breast mass and multiple enlarged left axillary and subpectoral lymph nodes Bilateral mastectomies11/21/2016 Left: Radial scar, FA, UDH no malignancy 0/1 lymph node negative; right: FA, radial scar, UDH no malignancy (pathologic complete response) ypT0Nyp0 Herceptin maintenance completedApril 2017 Adjuvant radiation therapy 08/06/2015- 09/12/15 ---------------------------------------------------------------------------------------------------------------------------------------------------- Current treatment:Surveillance  12/07/2016: CT chest abdomen pelvis: No evidence of lymphadenopathy.  Long-term side effects: Patient complains of intermittent fatigue as well as memory problems. Patient is going to [facility] in [place] and is on multiple supplements.  Breast cancer  surveillance: 1.  No role of breast imaging because she had bilateral mastectomies 2. chest examination did not reveal any lumps or nodules of concern.  Plan to obtain CT chest abdomen pelvis and bone density in 1 year and follow-up Return to clinic in 1 year for follow-up    Orders Placed This Encounter  Procedures  . CT Abdomen Pelvis W Contrast    Standing Status:   Future    Standing Expiration Date:   05/18/2019    Order Specific Question:   ** REASON FOR EXAM (FREE TEXT)    Answer:   Pain issues with breast cancer history    Order Specific Question:   If indicated for the ordered procedure, I authorize the administration of contrast media per Radiology protocol    Answer:   Yes    Order Specific Question:   Is patient pregnant?  Answer:   No    Order Specific Question:   Preferred imaging location?    Answer:   [facility]    Order Specific Question:   Is Oral Contrast requested for this exam?    Answer:   Yes, Per Radiology protocol    Order Specific Question:   Radiology Contrast Protocol - do NOT remove file path    Answer:   \\[email or web address].pdf  . CT Chest W Contrast    Standing Status:   Future    Standing Expiration Date:   05/18/2019    Order Specific Question:   ** REASON FOR EXAM (FREE TEXT)    Answer:   Pain issues breast cancer history    Order Specific Question:   If indicated for the ordered procedure, I authorize the administration of contrast media per Radiology protocol    Answer:   Yes    Order Specific Question:   Is patient pregnant?    Answer:   No    Order Specific Question:   Preferred imaging location?    Answer:   [facility]    Order Specific Question:   Radiology Contrast Protocol - do NOT remove file path    Answer:   \\[email or web address].pdf  . DG Bone Density    Standing Status:   Future    Standing Expiration Date:   06/22/2019    Order Specific Question:   Reason for exam:      Answer:   Post menopausal    Order Specific Question:   Preferred imaging location?    Answer:   [facility]   The patient has a good understanding of the overall plan. she agrees with it. she will call with any problems that may develop before the next visit here.  Nicholas Lose, MD 05/18/2018   I, Cloyde Reams Dorshimer, am acting as scribe for Nicholas Lose, MD.  I have reviewed the above documentation for accuracy and completeness, and I agree with the above.

## 2018-05-17 ENCOUNTER — Other Ambulatory Visit: Payer: Self-pay

## 2018-05-17 DIAGNOSIS — Z171 Estrogen receptor negative status [ER-]: Principal | ICD-10-CM

## 2018-05-17 DIAGNOSIS — C50412 Malignant neoplasm of upper-outer quadrant of left female breast: Secondary | ICD-10-CM

## 2018-05-18 ENCOUNTER — Inpatient Hospital Stay: Payer: BLUE CROSS/BLUE SHIELD | Attending: Hematology and Oncology | Admitting: Hematology and Oncology

## 2018-05-18 ENCOUNTER — Inpatient Hospital Stay: Payer: BLUE CROSS/BLUE SHIELD

## 2018-05-18 ENCOUNTER — Other Ambulatory Visit: Payer: Self-pay | Admitting: *Deleted

## 2018-05-18 ENCOUNTER — Telehealth: Payer: Self-pay | Admitting: Hematology and Oncology

## 2018-05-18 VITALS — BP 116/70 | HR 64 | Temp 98.5°F | Resp 18 | Ht 60.5 in | Wt 115.4 lb

## 2018-05-18 DIAGNOSIS — C50412 Malignant neoplasm of upper-outer quadrant of left female breast: Secondary | ICD-10-CM

## 2018-05-18 DIAGNOSIS — Z780 Asymptomatic menopausal state: Secondary | ICD-10-CM

## 2018-05-18 DIAGNOSIS — Z79899 Other long term (current) drug therapy: Secondary | ICD-10-CM | POA: Diagnosis not present

## 2018-05-18 DIAGNOSIS — Z923 Personal history of irradiation: Secondary | ICD-10-CM | POA: Diagnosis not present

## 2018-05-18 DIAGNOSIS — Z171 Estrogen receptor negative status [ER-]: Principal | ICD-10-CM

## 2018-05-18 DIAGNOSIS — Z9013 Acquired absence of bilateral breasts and nipples: Secondary | ICD-10-CM | POA: Diagnosis not present

## 2018-05-18 DIAGNOSIS — Z9221 Personal history of antineoplastic chemotherapy: Secondary | ICD-10-CM | POA: Insufficient documentation

## 2018-05-18 LAB — CMP (CANCER CENTER ONLY)
ALT: 17 U/L (ref 0–44)
AST: 26 U/L (ref 15–41)
Albumin: 4.1 g/dL (ref 3.5–5.0)
Alkaline Phosphatase: 87 U/L (ref 38–126)
Anion gap: 7 (ref 5–15)
BUN: 14 mg/dL (ref 6–20)
CO2: 28 mmol/L (ref 22–32)
Calcium: 9.8 mg/dL (ref 8.9–10.3)
Chloride: 104 mmol/L (ref 98–111)
Creatinine: 0.79 mg/dL (ref 0.44–1.00)
GFR, Est AFR Am: >60 mL/min
GFR, Estimated: >60 mL/min
Glucose, Bld: 90 mg/dL (ref 70–99)
Potassium: 4.6 mmol/L (ref 3.5–5.1)
Sodium: 139 mmol/L (ref 135–145)
Total Bilirubin: 0.7 mg/dL (ref 0.3–1.2)
Total Protein: 6.9 g/dL (ref 6.5–8.1)

## 2018-05-18 LAB — CBC WITH DIFFERENTIAL (CANCER CENTER ONLY)
Abs Immature Granulocytes: 0.01 K/uL (ref 0.00–0.07)
Basophils Absolute: 0.1 K/uL (ref 0.0–0.1)
Basophils Relative: 1 %
Eosinophils Absolute: 0.1 K/uL (ref 0.0–0.5)
Eosinophils Relative: 3 %
HCT: 40.6 % (ref 36.0–46.0)
Hemoglobin: 13.3 g/dL (ref 12.0–15.0)
Immature Granulocytes: 0 %
Lymphocytes Relative: 26 %
Lymphs Abs: 1.3 K/uL (ref 0.7–4.0)
MCH: 29.6 pg (ref 26.0–34.0)
MCHC: 32.8 g/dL (ref 30.0–36.0)
MCV: 90.4 fL (ref 80.0–100.0)
Monocytes Absolute: 0.4 K/uL (ref 0.1–1.0)
Monocytes Relative: 8 %
Neutro Abs: 3.3 K/uL (ref 1.7–7.7)
Neutrophils Relative %: 62 %
Platelet Count: 210 K/uL (ref 150–400)
RBC: 4.49 MIL/uL (ref 3.87–5.11)
RDW: 13.1 % (ref 11.5–15.5)
WBC Count: 5.2 K/uL (ref 4.0–10.5)
nRBC: 0 % (ref 0.0–0.2)

## 2018-05-18 NOTE — Telephone Encounter (Signed)
Gave patient avs and calendar.  Patient advised she will be calling [facility] for her bone density.  Radiology will call patient regarding CT.

## 2018-05-18 NOTE — Assessment & Plan Note (Addendum)
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:00 position, left axillary lymph node palpable, ER/PR negative, 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 5.32, T1 cN1 M0 stage II a clinical stage Treatment summary: Neo adjuvant chemotherapy with TCH for general every 3 weeks 6 cycles started 11/01/2014 completed 02/14/2015 Post-neo-adjuvant breast MRI 02/17/2015: No residual mass or abnormal enhancement, no enlarged lymph nodes Post-neo-adjuvant CT chest 02/18/2015: Interval resolution of the left breast mass and multiple enlarged left axillary and subpectoral lymph nodes Bilateral mastectomies11/21/2016 Left: Radial scar, FA, UDH no malignancy 0/1 lymph node negative; right: FA, radial scar, UDH no malignancy (pathologic complete response) ypT0Nyp0 Herceptin maintenance completedApril 2017 Adjuvant radiation therapy 08/06/2015- 09/12/15 ---------------------------------------------------------------------------------------------------------------------------------------------------- Current treatment:Surveillance  12/07/2016: CT chest abdomen pelvis: No evidence of lymphadenopathy.  Long-term side effects: Patient complains of intermittent fatigue as well as memory problems. [facility] [place] and is on multiple supplements.  Breast cancer surveillance: 1.  No role of breast imaging because she had bilateral mastectomies 2. chest examination did not reveal any lumps or nodules of concern.  Plan to obtain CT chest abdomen pelvis and bone density in 1 year and follow-up Return to clinic in 1 year for follow-up 

## 2019-04-26 ENCOUNTER — Telehealth: Payer: Self-pay | Admitting: *Deleted

## 2019-04-26 ENCOUNTER — Other Ambulatory Visit: Payer: Self-pay | Admitting: *Deleted

## 2019-04-26 DIAGNOSIS — Z171 Estrogen receptor negative status [ER-]: Secondary | ICD-10-CM

## 2019-04-26 DIAGNOSIS — C50412 Malignant neoplasm of upper-outer quadrant of left female breast: Secondary | ICD-10-CM

## 2019-04-26 NOTE — Telephone Encounter (Signed)
Received call from patient stating she would like to have her scans closer to her 5 year mark.  She is currently due for November and see Dr. Lindi Adie in December.  She would like to cancel appt for Dr. Lindi Adie in December.  She wants her scans in middle August 2021 and see Dr. Lindi Adie in September.  Per Dr. Lindi Adie this is ok.  I will cancel appt. For December and re-order her scans for August 2021.

## 2019-05-31 ENCOUNTER — Ambulatory Visit: Payer: BLUE CROSS/BLUE SHIELD | Admitting: Hematology and Oncology

## 2020-01-04 ENCOUNTER — Other Ambulatory Visit: Payer: Self-pay | Admitting: *Deleted

## 2020-01-04 DIAGNOSIS — C50412 Malignant neoplasm of upper-outer quadrant of left female breast: Secondary | ICD-10-CM

## 2020-01-31 ENCOUNTER — Telehealth: Payer: Self-pay | Admitting: Hematology and Oncology

## 2020-01-31 NOTE — Telephone Encounter (Signed)
Called pt per 8/5 sch msg - per patient , she needs to reschedule the scan because she will be out of town , she will give [place] a call back to schedule a f/u to review the scan with Dr. Lindi Adie

## 2020-02-11 ENCOUNTER — Other Ambulatory Visit: Payer: Self-pay

## 2020-02-11 ENCOUNTER — Encounter: Payer: Self-pay | Admitting: *Deleted

## 2020-02-11 ENCOUNTER — Ambulatory Visit: Payer: BC Managed Care – PPO | Attending: Hematology and Oncology | Admitting: Physical Therapy

## 2020-02-11 ENCOUNTER — Encounter: Payer: Self-pay | Admitting: Physical Therapy

## 2020-02-11 DIAGNOSIS — R278 Other lack of coordination: Secondary | ICD-10-CM | POA: Diagnosis present

## 2020-02-11 DIAGNOSIS — M6281 Muscle weakness (generalized): Secondary | ICD-10-CM | POA: Diagnosis not present

## 2020-02-11 NOTE — Patient Instructions (Addendum)
THE KNACK  The Knack is a strategy you may use to help to reduce or prevent leakage or passing of urine, gas or feces during an activity that causes downward force on the pelvic floor muscles.    Activities that can cause downward pressure on the pelvic floor muscles include coughing, sneezing, laughing, bending, lifting, and transitioning from different body positions such as from laying down to sitting up and sitting to standing.  To perform The Knack, consciously squeeze and lift your pelvic floor muscles to perform a strong, well-timed pelvic muscle contraction BEFORE AND DURING these activities above.  As your contraction gets more coordinated and your muscles get stronger, you will become more effective in controlling your experience of incontinence or gas passing during these activities.     Urge Incontinence  . Ideal urination frequency is every 2-4 wakeful hours, which equates to 5-8 times within a 24-hour period.   . Urge incontinence is leakage that occurs when the bladder muscle contracts, creating a sudden need to go before getting to the bathroom.   . Going too often when your bladder isn't actually full can disrupt the body's automatic signals to store and hold urine longer, which will increase urgency/frequency.  In this case, the bladder "is running the show" and strategies can be learned to retrain this pattern.   . One should be able to control the first urge to urinate, at around 165mL.  The bladder can hold up to a "grande latte," or 433mL. . To help you gain control, practice the Urge Drill below when urgency strikes.  This drill will help retrain your bladder signals and allow you to store and hold urine longer.  The overall goal is to stretch out your time between voids to reach a more manageable voiding schedule.    . Practice your "quick flicks" often throughout the day (each waking hour) even when you don't need feel the urge to go.  This will help strengthen your  pelvic floor muscles, making them more effective in controlling leakage.  Urge Drill  When you feel an urge to go, follow these steps to regain control: 1. Stop what you are doing and be still 2. Take one deep breath, directing your air into your abdomen 3. Think an affirming thought, such as "I've got this." 4. Do 5 quick flicks of your pelvic floor 5. Walk with control to the bathroom to void, or delay voiding    Toileting Techniques for Bowel Movements (Defecation) Using your belly (abdomen) and pelvic floor muscles to have a bowel movement is usually instinctive.  Sometimes people can have problems with these muscles and have to relearn proper defecation (emptying) techniques.  If you have weakness in your muscles, organs that are falling out, decreased sensation in your pelvis, or ignore your urge to go, you may find yourself straining to have a bowel movement.  You are straining if you are: . holding your breath or taking in a huge gulp of air and holding it  . keeping your lips and jaw tensed and closed tightly . turning red in the face because of excessive pushing or forcing . developing or worsening your  hemorrhoids . getting faint while pushing . not emptying completely and have to defecate many times a day  If you are straining, you are actually making it harder for yourself to have a bowel movement.  Many people find they are pulling up with the pelvic floor muscles and closing off instead of  opening the anus. Due to lack pelvic floor relaxation and coordination the abdominal muscles, one has to work harder to push the feces out.  Many people have never been taught how to defecate efficiently and effectively.  Notice what happens to your body when you are having a bowel movement.  While you are sitting on the toilet pay attention to the following areas: . Jaw and mouth position . Angle of your hips   . Whether your feet touch the ground or not . Arm placement  . Spine  position . Waist . Belly tension . Anus (opening of the anal canal)  An Evacuation/Defecation Plan   Here are the 4 basic points:  1. Lean forward enough for your elbows to rest on your knees 2. Support your feet on the floor or use a low stool if your feet don't touch the floor  3. Push out your belly as if you have swallowed a beach ball--you should feel a widening of your waist 4. Open and relax your pelvic floor muscles, rather than tightening around the anus       The following conditions my require modifications to your toileting posture:  . If you have had surgery in the past that limits your back, hip, pelvic, knee or ankle flexibility . Constipation   Your healthcare practitioner may make the following additional suggestions and adjustments:  1) Sit on the toilet  a) Make sure your feet are supported. b) Notice your hip angle and spine position--most people find it effective to lean forward or raise their knees, which can help the muscles around the anus to relax  c) When you lean forward, place your forearms on your thighs for support  2) Relax suggestions a) Breath deeply in through your nose and out slowly through your mouth as if you are smelling the flowers and blowing out the candles. b) To become aware of how to relax your muscles, contracting and releasing muscles can be helpful.  Pull your pelvic floor muscles in tightly by using the image of holding back gas, or closing around the anus (visualize making a circle smaller) and lifting the anus up and in.  Then release the muscles and your anus should drop down and feel open. Repeat 5 times ending with the feeling of relaxation. c) Keep your pelvic floor muscles relaxed; let your belly bulge out. d) The digestive tract starts at the mouth and ends at the anal opening, so be sure to relax both ends of the tube.  Place your tongue on the roof of your mouth with your teeth separated.  This helps relax your mouth and will  help to relax the anus at the same time.  3) Empty (defecation) a) Keep your pelvic floor and sphincter relaxed, then bulge your anal muscles.  Make the anal opening wide.  b) Stick your belly out as if you have swallowed a beach ball. c) Make your belly wall hard using your belly muscles while continuing to breathe. Doing this makes it easier to open your anus. d) Breath out and give a grunt (or try using other sounds such as ahhhh, shhhhh, ohhhh or grrrrrrr).  4) Finish a) As you finish your bowel movement, pull the pelvic floor muscles up and in.  This will leave your anus in the proper place rather than remaining pushed out and down. If you leave your anus pushed out and down, it will start to feel as though that is normal and give you incorrect  signals about needing to have a bowel movement.   Strategies for Constipation - Developing a Bowel Routine  Bowels love routine.  Choose the best time of day to have a bowel movement.  Usually the best time of day for a bowel movement in 30 min to 1 hour after breakfast or lunch.  It can take a week or longer to transition to new bowel routines, but if you follow the guidelines below, you will start to retrain your system for more successful bowel routines.  Eat breakfast every morning to try to get things moving along.  Eating stimulates the bowels through turning on the gastrocolic reflex, which are the wave-like contractions of smooth muscle in your intestines to help move feces along.  Adding in a hot beverage with breakfast may help as well.  Think of your digestive tract as being a conveyor belt - new food comes in and pushes along "old food," with the end of the line being a bowel movement.  You may add 1/4 cup or 1/3 cup of prune juice every evening to help stimulate morning bowel function until you see more successful bowel function.  Create routine in both your timing and portions of intake of food and drink throughout the day.  Eat each of  your meals at consistent times of the day.  For portions, the size of each meal may vary, but try to eat consistent portions each day at breakfast, lunch and dinner.  For example, you may have a small breakfast every day and a big lunch every day. This is fine if it is a consistent pattern of intake.  With each meal, aim for at least 2 servings of fruits and vegetables and at least one serving of whole grains (whole grain cereal, brown rice, bran, whole wheat or rye bread, oatmeal) to get some fiber in your system.  These foods provide soft bulk for the bowel.  Drink water.  Aim for at least 8 large glasses of water a day.  Water helps the stool stay softer and easier to pass.  If you are adding more fiber in your diet, be sure to increase your water intake too.  Exercise can help move things along the digestive tract.  Exercise may be a walk, jog, yoga, exercise video, gym time, or even household walking or marching in place at the kitchen countertop.  Pairing exercise and breakfast as part of your morning routine may help establish a morning bowel routine.  Listen to your body's signals that it is time to go to the bathroom!  Our body has automatic reflexes that signal when it is time to have a bowel movement.  If you are establishing routines to help stimulate your bowels, be sure to allow time for toileting.  For example, if you typically have a bowel movement after breakfast, get up early enough to eat AND empty your bowels before your schedule gets too busy or before it becomes inconvenient to access a bathroom.  If we routinely delay or hold back a bowel movement, these reflexes can stop signaling, leading to constipation which may become chronic (long-term).    Ensure proper toileting technique including posture, breathing and emptying strategies.*  Abdominal massage may also help stimulate your bowels and reduce the discomfort that accompanies constipation.*  *Your PT can educate you on  toileting strategies and self-massage for your abdomen.  Your PT may also ask you to complete a food log and bowel diary for 1-3 days to help  identify patterns and make suggestions for increased success in managing your symptoms.  Access Code: [email or web address] URL: [email or web address]: 08/16/2021Prepared by: Venetia Night BeuhringExercises  Supine Pelvic Floor Contraction - 8 x daily - 7 x weekly - 1 sets - 10 reps - 10 hold  Sidelying Diaphragmatic Breathing - 1 x daily - 7 x weekly - 1 sets - 2-3 min hold

## 2020-02-11 NOTE — Therapy (Signed)
[facility] [address], [place], [place], [address] Phone: [phone]   Fax:  [phone]  Physical Therapy Evaluation  Patient Details  Name: [name] [name] MRN: [id number] Date of Birth: [birth year] Referring Provider (PT): Nicholas Lose, MD   Encounter Date: 02/11/2020   PT End of Session - 02/11/20 1136    Visit Number 1    Date for PT Re-Evaluation 05/05/20    Authorization Type BC[employer]    PT Start Time 1020    PT Stop Time 1112    PT Time Calculation (min) 52 min    Activity Tolerance Patient tolerated treatment well    Behavior During Therapy [facility] for tasks assessed/performed           Past Medical History:  Diagnosis Date  . Breast cancer ([place]) 2016   ER-/PR-/Her2+  . Breast cancer of upper-outer quadrant of right female breast ([place]) 10/11/2014  . Family history of breast cancer   . Family history of colon cancer   . Migraines    takes metoprolol  for migraine headaches  . Neurogenic bladder   . Osteopenia   . Radiation 08/06/15-09/12/15   left chest wall axillary and supraclavicular region 45 gray  . Wears contact lenses     Past Surgical History:  Procedure Laterality Date  . BREAST RECONSTRUCTION WITH PLACEMENT OF TISSUE EXPANDER AND FLEX HD (ACELLULAR HYDRATED DERMIS) Bilateral 05/19/2015   Procedure: BILATERAL NIPPLE SPARING MASTECTOMY WITH LEFT AXILLARY SENTINAL LYMPH NODE BIOPSY;  Surgeon: Erroll Luna, MD;  Location: [place];  Service: General;  Laterality: Bilateral;  . BREAST SURGERY     rt, breast biopsy  . COLONOSCOPY    . NIPPLE SPARING MASTECTOMY/SENTINAL LYMPH NODE BIOPSY/RECONSTRUCTION/PLACEMENT OF TISSUE EXPANDER Bilateral 05/19/2015   Procedure: BILATERAL NIPPLE SPARING MASTECTOMY WITH LEFT AXILLARY SENTINAL LYMPH NODE BIOPSY;  Surgeon: Erroll Luna, MD;  Location: [place];  Service: General;  Laterality: Bilateral;  . PORTACATH PLACEMENT Right 10/29/2014   Procedure: INSERTION  PORT-A-CATH WITH ULTRASOUND;  Surgeon: Erroll Luna, MD;  Location: [place];  Service: General;  Laterality: Right;  . UPPER GI ENDOSCOPY    . VEIN LIGATION     both legs    There were no vitals filed for this visit.    Subjective Assessment - 02/11/20 1021    Subjective Pt with history of malignant breast cancer with reconstruction chemo and radiation in 2016 and 2017.  Has 5 year scans next week.  Immediate menopause onset wtih chemo in May 2016.  Pt has rectocele and cystocele.  Pt experiences leakage and constipation.  Pt also has painful intercourse.    Pertinent History breast cancer Lt sided with bil mastectomy with reconstruction 2016, chemo 2016 and radiation 2017, estrogen receptor negative, supplements are helping gut health and gas    Patient Stated Goals better strategies for pelvic symptoms    Currently in Pain? No/denies              [facility] PT Assessment - 02/11/20 0001      Assessment   Medical Diagnosis C50.412,Z17.1 (ICD-10-CM) - Malignant neoplasm of upper-outer quadrant of left breast in female, estrogen receptor negative ([place])    Referring Provider (PT) Nicholas Lose, MD    Onset Date/Surgical Date --   2016   Next MD Visit yes    Prior Therapy no      Precautions   Precautions Other (comment)    Precaution Comments osteoporosis  Restrictions   Weight Bearing Restrictions No      Balance Screen   Has the patient fallen in the past 6 months No      Prior Function   Level of Independence Independent    Vocation Part time employment    Vocation Requirements works for [employer], some lifting, sitting, lots of activity      Cognition   Overall Cognitive Status Within Functional Limits for tasks assessed      Posture/Postural Control   Posture/Postural Control No significant limitations      ROM / Strength   AROM / PROM / Strength AROM;PROM;Strength      AROM   Overall AROM Comments trunk ROM WNL all planes      PROM    Overall PROM Comments limited bil hip ER 20%      Strength   Overall Strength Comments grossly 5/5 throughout bil LEs      Flexibility   Soft Tissue Assessment /Muscle Length yes   adductors limited 30% bil   Hamstrings limited bil 25%                      Objective measurements completed on examination: See above findings.     Pelvic Floor Special Questions - 02/11/20 0001    Prior Pelvic/Prostate Exam Yes    Prior Urinalysis No    Prior Pregnancies Yes    Number of Pregnancies 3    Number of Vaginal Deliveries 3    Any difficulty with labor and deliveries Yes    Episiotomy Performed Yes    Diastasis Recti Yes, 3 fingers but good tension    Currently Sexually Active Yes    Is this Painful Yes    History of sexually transmitted disease No    Marinoff Scale discomfort that does not affect completion    Urinary Leakage Yes    How often daily    Pad use liner    Activities that cause leaking With strong urge;Sneezing    Urinary urgency Yes    Urinary frequency 7-9/day    Fecal incontinence No    Fluid intake 30-40oz H2O/day    Caffeine beverages 1-1.5 cups coffee/day with oat milk    Falling out feeling (prolapse) Yes    Activities that cause feeling of prolapse during intercourse    External Perineal Exam PT explained assessment and Pt gave verbal consent    Skin Integrity Intact    Scar Episiotomy;Well healed    Perineal Body/Introitus  Descended    External Palpation non tender    Prolapse Anterior Wall;Posterior Wall    Pelvic Floor Internal Exam PT explained assessment and Pt gave verbal consent    Exam Type Vaginal    Palpation non-tender    Strength weak squeeze, no lift    Strength # of reps 3    Strength # of seconds 2                    PT Education - 02/11/20 1128    Education Details toileting techniques, urge drill, knack, bowel routine, Access Code: [email or web address] (PF contractions and diaphragmatic breathing)    Person(s) Educated  Patient    Methods Explanation;Verbal cues;Handout;Demonstration    Comprehension Verbalized understanding;Returned demonstration            PT Short Term Goals - 02/11/20 1410      PT SHORT TERM GOAL #1   Title Pt will be ind with initial HEP and  self-care strategies for pelvic floor strength and symptom management.    Baseline PT gave splinting, bowel routine, urge drill, knack, toileting techniques, PF contractions and diaphragmatic breathing at eval    Time 4    Period Weeks    Status New    Target Date 03/10/20      PT SHORT TERM GOAL #2   Title Pt will be able to perform 10x5 sec holds for PF contractions without substitution in anti-gravity position.    Baseline 3x2 sec holds    Time 4    Period Weeks    Status New    Target Date 03/10/20      PT SHORT TERM GOAL #3   Title Pt will achieve flexibility in LE adductors and hamstrings to [facility] bil.    Baseline limited bil 25-30%    Time 4    Period Weeks    Status New    Target Date 03/10/20      PT SHORT TERM GOAL #4   Title Pt will verbalize at least 2 strategies she is implementing to estabilish improved bowel routine    Time 4    Period Weeks    Status New    Target Date 03/10/20      PT SHORT TERM GOAL #5   Title Pt will be able to demo good diaphragmatic breathing for PF relaxation and stress management benefits.    Time 4    Period Weeks    Status New    Target Date 03/10/20             PT Long Term Goals - 02/11/20 1417      PT LONG TERM GOAL #1   Title Pt will be ind with advanced HEP and implement daily or weekly strategies for improved pelvic floor function around toileting, strength, bowel routine and sexual function.    Time 8    Period Weeks    Status New    Target Date 04/07/20      PT LONG TERM GOAL #2   Title Pt will achieve [place] Stool Scale Type 2 or Type 3 every 1-3 days using proper toileting and splinting techniques to improve defecation ease and support prolapse.    Time 8     Period Weeks    Status New    Target Date 04/07/20      PT LONG TERM GOAL #3   Title Pt will achieve PF strength of at least 3/5 and report reduced incidence of urinary leakage by at least 50%    Time 8    Period Weeks    Status New    Target Date 04/07/20      PT LONG TERM GOAL #4   Title Pt will reduce Marinoff Score from 1/3 to 0/3 at least 50% of the time.    Baseline 1/3    Time 8    Period Weeks    Status New    Target Date 04/07/20                  Plan - 02/11/20 1300    Clinical Impression Statement Pt is a pleasant 59yo female referred to PT with goal of managing pelvic floor symptoms.  Pt has a history of acute onset of menopause following bil mastectomy/reconstruction, chemo and radiation in 2016/2017.  She has been diagnosed with rectocele and cystocele and experiences vaginal dryness, painful intercourse, constipation, and urinary leakage.  Her most bothersome symptom is consitpation with defecation difficulty.  She leaks daily in various amounts with urgency and sneezing.  [place] Stool Scale is Type 1 and 2.  Marinoff Scale is 1/3.  She presents with good trunk mobility, limited flexiblity of bil hips, vaginal atrophy, Gr II/III rectocele and Gr II cystocele, and weakness of PF.  PT initiated self-care strategies for the Knack, Urge Drill, Toileting Techniques and Bowel Routine.  PT started Pt on PF contractions, diaphragmatic breathing, and discussed internal and external splinting techniques.  Pt will benefit from skilled PT to addess deficits and educate Pt on self-care strategies to improve experience of PF symptoms.    Personal Factors and Comorbidities Comorbidity 1;Comorbidity 2;Time since onset of injury/illness/exacerbation    Comorbidities rectocele, cystocele, Hx of breast CA (estrogen receptor negative)    Examination-Activity Limitations Toileting    Examination-Participation Restrictions Community Activity;Occupation    Stability/Clinical Decision  Making Stable/Uncomplicated    Clinical Decision Making Low    Rehab Potential Excellent    PT Frequency 1x / week    PT Duration 12 weeks    PT Treatment/Interventions ADLs/Self Care Home Management;Electrical Stimulation;Moist Heat;Neuromuscular re-education;Therapeutic exercise;Functional mobility training;Therapeutic activities;Patient/family education;Manual techniques;Passive range of motion;Spinal Manipulations;Joint Manipulations    PT Next Visit Plan f/u on all self-care education from eval, SL diaphragmatic breathing, re-check PF contractions, add TrA and glut strength exercises, lumbar STM to improve TrA anterior slide    PT Home Exercise Plan Access Code: [email or web address] (PF contractions and diaphragmatic breathing), + toileting techniques, bowel routine, the Knack, Urge Drill    Consulted and Agree with Plan of Care Patient           Patient will benefit from skilled therapeutic intervention in order to improve the following deficits and impairments:  Decreased coordination, Decreased strength, Impaired flexibility, Decreased range of motion  Visit Diagnosis: Muscle weakness (generalized) - Plan: PT plan of care cert/re-cert  Other lack of coordination - Plan: PT plan of care cert/re-cert     Problem List Patient Active Problem List   Diagnosis Date Noted  . Allergic dermatitis 06/19/2015  . Hypotension 12/30/2014  . External hemorrhoid 12/13/2014  . UTI (urinary tract infection) 11/16/2014  . Genetic testing 11/07/2014  . Migraine headache 11/06/2014  . Family history of breast cancer   . Family history of colon cancer   . Breast cancer of upper-outer quadrant of left female breast ([place]) 10/11/2014    Baruch Merl, PT 02/11/20 2:28 PM   [facility] [address], [place], [place], [address] Phone: [phone]   Fax:  [phone]  Name: [name] [name] MRN: [id number] Date of Birth: 1961/06/01

## 2020-02-13 ENCOUNTER — Other Ambulatory Visit: Payer: Self-pay

## 2020-02-13 ENCOUNTER — Ambulatory Visit (HOSPITAL_COMMUNITY)
Admission: RE | Admit: 2020-02-13 | Discharge: 2020-02-13 | Disposition: A | Discharge status: Home / Self Care | Payer: BC Managed Care – PPO | Source: Ambulatory Visit | Attending: Hematology and Oncology | Admitting: Hematology and Oncology

## 2020-02-13 ENCOUNTER — Encounter (HOSPITAL_COMMUNITY): Payer: Self-pay

## 2020-02-13 DIAGNOSIS — Z171 Estrogen receptor negative status [ER-]: Secondary | ICD-10-CM | POA: Insufficient documentation

## 2020-02-13 DIAGNOSIS — C50412 Malignant neoplasm of upper-outer quadrant of left female breast: Secondary | ICD-10-CM

## 2020-02-13 MED ORDER — SODIUM CHLORIDE (PF) 0.9 % IJ SOLN
INTRAMUSCULAR | Status: AC
  Filled 2020-02-13: qty 50

## 2020-02-13 MED ORDER — IOHEXOL 300 MG/ML  SOLN
100.0000 mL | Freq: Once | INTRAMUSCULAR | Status: AC | PRN
  Administered 2020-02-13: 100 mL via INTRAVENOUS

## 2020-02-18 NOTE — Progress Notes (Signed)
Patient Care Team: Podraza, Sindy Messing, PA-C as PCP - General (Physician Assistant) Erroll Luna, MD as Consulting Physician (General Surgery) Nicholas Lose, MD as Consulting Physician (Hematology and Oncology) Gery Pray, MD as Consulting Physician (Radiation Oncology) Mauro Kaufmann, RN as Registered Nurse Rockwell Germany, RN as Registered Nurse  DIAGNOSIS:    ICD-10-CM   1. Malignant neoplasm of upper-outer quadrant of left breast in female, estrogen receptor negative ([place])  C50.412    Z17.1     SUMMARY OF ONCOLOGIC HISTORY: Oncology History  Breast cancer of upper-outer quadrant of left female breast ([place])  10/01/2014 Mammogram   Left breast mammogram and ultrasound for palpable left breast mass which was a maxillary mass with an ill-defined abnormality by ultrasound 1.7 cm at 2:00   10/11/2014 Initial Diagnosis   Left breast invasive ductal carcinoma, grade 3, ER/PR negative, 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 5.32, axillary lymph node also positive for cancer   10/22/2014 Breast MRI   Left breast: 1.9 cm irr.enhancing mass in UOQ; 1.0 cm suspicious in UIQ. Right breast: 1.2 cm sl. Irr. enhancing mass in posterior third of UOQ. Multiple enlarged level 1 and 2 left axillary LN.   10/28/2014 Procedure   GT: no del mutations at APC, ATM, AXIN2, BARD1, BMPR1A, BRCA1, BRCA2, BRIP1, CDH1, CDK4, CDKN2A, CHEK2, EPCAM, FANCC, MLH1, MSH2, MSH6, MUTYH, NBN, PALB2, PMS2, POLD1, POLE, PTEN, RAD51C, RAD51D, SCG5/GREM1, SMAD4, STK11, TP53, VHL, and XRCC2   10/30/2014 Imaging   CT chest abdomen pelvis and bone scan revealed no evidence of distant metastatic disease but enlarged left axillary and left subpectoral lymph nodes   10/30/2014 Clinical Stage   Stage IIA: T1c N1   11/01/2014 - 02/14/2015 Neo-Adjuvant Chemotherapy   Neoadjuvant TCH [place] 6 cycles followed by Herceptin maintenance (completed 10/23/2015)   02/17/2015 Breast MRI   No residual mass or abnormal enhancement, no enlarged lymph  nodes   05/19/2015 Surgery   Bilateral mastectomies with immediate reconstruction Left: Radial scar, FA, UDH no malignancy 0/1 lymph node negative; right: FA, radial scar, UV H    05/19/2015 Pathologic Stage   complete path response: no malignancy ypT0 ypN0   08/06/2015 - 09/12/2015 Radiation Therapy   Adjuvant XRT: Left chest wall, axilla, SCV: 45 Gy over 25 fractions   12/23/2015 Survivorship   SCP visit completed and copy given to patient     CHIEF COMPLIANT: Follow up of left breast cancer  INTERVAL HISTORY: [name] [name] is a 59 y.o. with above-mentioned history of left breast cancer who underwent neoadjuvant chemotherapy with complete pathologic response,  bilateral mastectomies, radiation, and is currently on surveillance. CT CAP on 02/13/20 showed no evidence of malignancy. She presents to the clinic today for follow-up.  She reports no new problems or concerns with regards to her breasts.  Recently she has had multiple problems with mouth sores and she has been to her primary care and her dentist.  It was felt to be aphthous ulcers related to stress related to her family issues.  She had several family members who had passed away recently.  She is otherwise in excellent spirits.  ALLERGIES:  is allergic to meperidine, stadol [butorphanol], zofran [ondansetron hcl], and sulfa antibiotics.  MEDICATIONS:  Current Outpatient Medications  Medication Sig Dispense Refill  . acetaminophen (TYLENOL) 325 MG tablet Take 650 mg by mouth every 6 (six) hours as needed for mild pain. Reported on 12/23/2015    . atenolol (TENORMIN) 25 MG tablet Take 75 mg by mouth daily. Reported on  10/23/2015    . Calcium Carbonate (CALCIUM 600 PO) Take 1 tablet by mouth daily.     . cholecalciferol (VITAMIN D) 1000 UNITS tablet Take 1,000 Units by mouth daily.    . Diclofenac Potassium 50 MG PACK Take 50 mg by mouth. Reported on 09/09/2015    . doxycycline (VIBRA-TABS) 100 MG tablet Take 1 tablet (100 mg total) by  mouth 2 (two) times daily. 14 tablet 0  . eletriptan (RELPAX) 40 MG tablet Take 1 tablet (40 mg total) by mouth every 2 (two) hours as needed for migraine or headache (Max 2 per day). 10 tablet 3  . hydrocortisone 2.5 % ointment Apply topically 2 (two) times daily. (Patient not taking: Reported on 05/13/2016) 30 g 0  . ibuprofen (ADVIL,MOTRIN) 200 MG tablet Take 200 mg by mouth. Reported on 12/23/2015    . Lactobacillus (ACIDOPHILUS PO) Take 1 capsule by mouth daily.     . Multiple Vitamins-Minerals (MULTIVITAMIN ADULT PO) Take by mouth.    Marland Kitchen PARoxetine (PAXIL) 20 MG tablet Take 20 mg by mouth.    . promethazine (PHENERGAN) 12.5 MG tablet Take 1 tablet (12.5 mg total) by mouth every 6 (six) hours as needed for nausea or vomiting. (Patient not taking: Reported on 05/13/2016) 90 tablet 3   No current facility-administered medications for this visit.    PHYSICAL EXAMINATION: ECOG PERFORMANCE STATUS: 1 - Symptomatic but completely ambulatory  Vitals:   02/19/20 1125  BP: 94/70  Pulse: 64  Resp: 17  Temp: 97.6 F (36.4 C)  SpO2: 99%   Filed Weights   02/19/20 1125  Weight: 106 lb 6.4 oz (48.3 kg)    LABORATORY DATA:  I have reviewed the data as listed CMP Latest Ref Rng & Units 05/18/2018 05/12/2017 10/23/2015  Glucose 70 - 99 mg/dL 90 82 73  BUN 6 - 20 mg/dL 14 16.3 19.6  Creatinine 0.44 - 1.00 mg/dL 0.79 0.8 0.8  Sodium 135 - 145 mmol/L 139 139 141  Potassium 3.5 - 5.1 mmol/L 4.6 4.1 4.0  Chloride 98 - 111 mmol/L 104 - -  CO2 22 - 32 mmol/L $RemoveB'28 29 29  'pIKrIkfC$ Calcium 8.9 - 10.3 mg/dL 9.8 9.6 9.5  Total Protein 6.5 - 8.1 g/dL 6.9 7.1 6.6  Total Bilirubin 0.3 - 1.2 mg/dL 0.7 0.72 0.46  Alkaline Phos 38 - 126 U/L 87 93 101  AST 15 - 41 U/L 26 27 39(H)  ALT 0 - 44 U/L 17 25 37    Lab Results  Component Value Date   WBC 5.2 05/18/2018   HGB 13.3 05/18/2018   HCT 40.6 05/18/2018   MCV 90.4 05/18/2018   PLT 210 05/18/2018   NEUTROABS 3.3 05/18/2018    ASSESSMENT & PLAN:  Breast  cancer of upper-outer quadrant of left female breast 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:00 position, left axillary lymph node palpable, ER/PR negative, 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 5.32, T1 cN1 M0 stage II a clinical stage Treatment summary: Neo adjuvant chemotherapy with [place] for general every 3 weeks 6 cycles started 11/01/2014 completed 02/14/2015 Post-neo-adjuvant breast MRI 02/17/2015: No residual mass or abnormal enhancement, no enlarged lymph nodes Post-neo-adjuvant CT chest 02/18/2015: Interval resolution of the left breast mass and multiple enlarged left axillary and subpectoral lymph nodes Bilateral mastectomies11/21/2016 Left: Radial scar, FA, UDH no malignancy 0/1 lymph node negative; right: FA, radial scar, UDH no malignancy (pathologic complete response) [id number] Herceptin maintenance completedApril 2017 Adjuvant radiation therapy 08/06/2015- 09/12/15 ---------------------------------------------------------------------------------------------------------------------------------------------------- Current treatment:Surveillance  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 Aphthous ulcers: Being managed by her primary care and her dentist as well as her holistic physician. Dr. Lucia Gaskins Breast cancer surveillance: 1.  No role of breast imaging because she had bilateral mastectomies 2. chest examination did not reveal any lumps or nodules of concern.  CT CAP 02/13/2020: No evidence of recurrent malignancy.  No need of further CT scans. Return to clinic on an as-needed basis.  No orders of the defined types were placed in this encounter.  The patient has a good understanding of the overall plan. she agrees with it. she will call with any problems that may develop before the next visit here.  Total time spent: 20 mins including face to face time and time spent for planning, charting and coordination of  care  Nicholas Lose, MD 02/19/2020  I, Cloyde Reams Dorshimer, am acting as scribe for Dr. Nicholas Lose.  I have reviewed the above documentation for accuracy and completeness, and I agree with the above.

## 2020-02-19 ENCOUNTER — Encounter: Payer: Self-pay | Admitting: Physical Therapy

## 2020-02-19 ENCOUNTER — Inpatient Hospital Stay: Payer: BC Managed Care – PPO | Attending: Hematology and Oncology | Admitting: Hematology and Oncology

## 2020-02-19 ENCOUNTER — Other Ambulatory Visit: Payer: Self-pay

## 2020-02-19 ENCOUNTER — Ambulatory Visit: Payer: BC Managed Care – PPO | Admitting: Physical Therapy

## 2020-02-19 DIAGNOSIS — Z79899 Other long term (current) drug therapy: Secondary | ICD-10-CM | POA: Insufficient documentation

## 2020-02-19 DIAGNOSIS — M6281 Muscle weakness (generalized): Secondary | ICD-10-CM | POA: Diagnosis not present

## 2020-02-19 DIAGNOSIS — C50412 Malignant neoplasm of upper-outer quadrant of left female breast: Secondary | ICD-10-CM | POA: Insufficient documentation

## 2020-02-19 DIAGNOSIS — R278 Other lack of coordination: Secondary | ICD-10-CM

## 2020-02-19 DIAGNOSIS — Z171 Estrogen receptor negative status [ER-]: Secondary | ICD-10-CM | POA: Diagnosis not present

## 2020-02-19 NOTE — Assessment & Plan Note (Signed)
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:00 position, left axillary lymph node palpable, ER/PR negative, 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 5.32, T1 cN1 M0 stage II a clinical stage Treatment summary: Neo adjuvant chemotherapy with [facility] for general every 3 weeks 6 cycles started 11/01/2014 completed 02/14/2015 Post-neo-adjuvant breast MRI 02/17/2015: No residual mass or abnormal enhancement, no enlarged lymph nodes Post-neo-adjuvant CT chest 02/18/2015: Interval resolution of the left breast mass and multiple enlarged left axillary and subpectoral lymph nodes Bilateral mastectomies11/21/2016 Left: Radial scar, FA, UDH no malignancy 0/1 lymph node negative; right: FA, radial scar, UDH no malignancy (pathologic complete response) ypT0Nyp0 Herceptin maintenance completedApril 2017 Adjuvant radiation therapy 08/06/2015- 09/12/15 ---------------------------------------------------------------------------------------------------------------------------------------------------- Current treatment:Surveillance  12/07/2016: CT chest abdomen pelvis: No evidence of lymphadenopathy.  Long-term side effects: Patient complains of intermittent fatigue as well as memory problems. 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  Breast cancer surveillance: 1.  No role of breast imaging because she had bilateral mastectomies 2. chest examination did not reveal any lumps or nodules of concern.  CT CAP 02/13/2020: No evidence of recurrent malignancy. Return to clinic in 1 year for follow-up

## 2020-02-19 NOTE — Therapy (Signed)
[facility] [address] [place], [place], [address] Phone: [phone]   Fax:  [phone]  Physical Therapy Treatment  Patient Details  Name: [name] [name] MRN: [id number] Date of Birth: [birth year] Referring Provider (PT): Nicholas Lose, MD   Encounter Date: 02/19/2020   PT End of Session - 02/19/20 1355    Visit Number 2    Date for PT Re-Evaluation 05/05/20    Authorization Type BCBS    PT Start Time 1400    PT Stop Time 1445    PT Time Calculation (min) 45 min    Activity Tolerance Patient tolerated treatment well    Behavior During Therapy [facility] for tasks assessed/performed           Past Medical History:  Diagnosis Date  . Breast cancer ([place]) 2016   ER-/PR-/Her2+  . Breast cancer of upper-outer quadrant of right female breast ([place]) 10/11/2014  . Family history of breast cancer   . Family history of colon cancer   . Migraines    takes metoprolol  for migraine headaches  . Neurogenic bladder   . Osteopenia   . Radiation 08/06/15-09/12/15   left chest wall axillary and supraclavicular region 45 gray  . Wears contact lenses     Past Surgical History:  Procedure Laterality Date  . BREAST RECONSTRUCTION WITH PLACEMENT OF TISSUE EXPANDER AND FLEX HD (ACELLULAR HYDRATED DERMIS) Bilateral 05/19/2015   Procedure: BILATERAL NIPPLE SPARING MASTECTOMY WITH LEFT AXILLARY SENTINAL LYMPH NODE BIOPSY;  Surgeon: Erroll Luna, MD;  Location: [facility];  Service: General;  Laterality: Bilateral;  . BREAST SURGERY     rt, breast biopsy  . COLONOSCOPY    . NIPPLE SPARING MASTECTOMY/SENTINAL LYMPH NODE BIOPSY/RECONSTRUCTION/PLACEMENT OF TISSUE EXPANDER Bilateral 05/19/2015   Procedure: BILATERAL NIPPLE SPARING MASTECTOMY WITH LEFT AXILLARY SENTINAL LYMPH NODE BIOPSY;  Surgeon: Erroll Luna, MD;  Location: [facility];  Service: General;  Laterality: Bilateral;  . PORTACATH PLACEMENT Right 10/29/2014   Procedure: INSERTION  PORT-A-CATH WITH ULTRASOUND;  Surgeon: Erroll Luna, MD;  Location: [facility];  Service: General;  Laterality: Right;  . UPPER GI ENDOSCOPY    . VEIN LIGATION     both legs    There were no vitals filed for this visit.   Subjective Assessment - 02/19/20 1357    Subjective The urge drill is helping me so much.  I am working on [employer] and I don't know that I'm gaining much strength yet.  I am clear in my CT scans today for cancer!!!!    Pertinent History breast cancer Lt sided with bil mastectomy with reconstruction 2016, chemo 2016 and radiation 2017, estrogen receptor negative, supplements are helping gut health and gas                             [facility] Adult PT Treatment/Exercise - 02/19/20 0001      Neuro Re-ed    Neuro Re-ed Details  prone over 2 pillows TrA grape cue and blueberry pick up elevator ride VCs and imagery used for improved anterior PF contractions 10 reps, SL diaphrag breathing with exhale gutteral sound and bulge PF with self-palpation at anus for feedback, VCs and TCs at abdomen by PT      Exercises   Exercises Knee/Hip;Lumbar      Lumbar Exercises: Standing   Other Standing Lumbar Exercises ski jump wall plank with PF/TrA, Pt had hard time feeling muscles  in this position      Lumbar Exercises: Sidelying   Other Sidelying Lumbar Exercises open books x 5 reps with breath work at motion barrier bil      Lumbar Exercises: Quadruped   Other Quadruped Lumbar Exercises quadruped rocking with VCs and TCs to open SITS bones, sink femoral heads, lift tailbone, open PF x 10 reps    Other Quadruped Lumbar Exercises posterior and lateral costal breathing with TCs over red ball x 10 cycles      Manual Therapy   Manual Therapy --                    PT Short Term Goals - 02/19/20 1627      PT SHORT TERM GOAL #1   Title Pt will be ind with initial HEP and self-care strategies for pelvic floor strength and symptom management.     Status Achieved      PT SHORT TERM GOAL #2   Title Pt will be able to perform 10x5 sec holds for PF contractions without substitution in anti-gravity position.    Status On-going      PT SHORT TERM GOAL #3   Title Pt will achieve flexibility in LE adductors and hamstrings to [facility] bil.    Status On-going      PT SHORT TERM GOAL #5   Title Pt will be able to demo good diaphragmatic breathing for PF relaxation and stress management benefits.    Status Achieved             PT Long Term Goals - 02/11/20 1417      PT LONG TERM GOAL #1   Title Pt will be ind with advanced HEP and implement daily or weekly strategies for improved pelvic floor function around toileting, strength, bowel routine and sexual function.    Time 8    Period Weeks    Status New    Target Date 04/07/20      PT LONG TERM GOAL #2   Title Pt will achieve Brisol Stool Scale Type 2 or Type 3 every 1-3 days using proper toileting and splinting techniques to improve defecation ease and support prolapse.    Time 8    Period Weeks    Status New    Target Date 04/07/20      PT LONG TERM GOAL #3   Title Pt will achieve PF strength of at least 3/5 and report reduced incidence of urinary leakage by at least 50%    Time 8    Period Weeks    Status New    Target Date 04/07/20      PT LONG TERM GOAL #4   Title Pt will reduce Marinoff Score from 1/3 to 0/3 at least 50% of the time.    Baseline 1/3    Time 8    Period Weeks    Status New    Target Date 04/07/20                 Plan - 02/19/20 1623    Clinical Impression Statement Pt reports much success using the Urge Drill and has only had one leakage episode since eval using this technique.  She was able to demo improved diaphragmatic breathing but has trouble performing belly big/belly hard and bulging PF for defecation.  PT gave Pt self-palpation in SL at anus to work on this, adjusted position for PF contractions to prone over pillows due to improved  awareness of muscles  in this position, and updated pelvic and spine ROM to include open books, hip IR/ER windshield wipers and quadruped rocking.  Pt tends to overuse glutes and tuck pelvis.  Pt will benefit from ongoing skilled intervention to improve functional use of PF for strength and bulge ability.    Comorbidities rectocele, cystocele, Hx of breast CA (estrogen receptor negative)    PT Frequency 1x / week    PT Duration 12 weeks    PT Treatment/Interventions ADLs/Self Care Home Management;Electrical Stimulation;Moist Heat;Neuromuscular re-education;Therapeutic exercise;Functional mobility training;Therapeutic activities;Patient/family education;Manual techniques;Passive range of motion;Spinal Manipulations;Joint Manipulations    PT Next Visit Plan f/u on SL diaphrag breath with self-palpation for exhale/bulge, prone over pillows PF/TrA, demo abdominal massage, internal recheck on PF contractions, lumbar STM for TrA anterior slide    PT Home Exercise Plan Access Code: [email or web address] (PF contractions and diaphragmatic breathing), + toileting techniques, bowel routine, the Knack, Urge Drill    Recommended Other Services assess dietary fiber intake - log?    Consulted and Agree with Plan of Care Patient           Patient will benefit from skilled therapeutic intervention in order to improve the following deficits and impairments:     Visit Diagnosis: Muscle weakness (generalized)  Other lack of coordination     Problem List Patient Active Problem List   Diagnosis Date Noted  . Allergic dermatitis 06/19/2015  . Hypotension 12/30/2014  . External hemorrhoid 12/13/2014  . UTI (urinary tract infection) 11/16/2014  . Genetic testing 11/07/2014  . Migraine headache 11/06/2014  . Family history of breast cancer   . Family history of colon cancer   . Breast cancer of upper-outer quadrant of left female breast ([place]) 10/11/2014    Baruch Merl, PT 02/19/20 4:28 PM   [facility] [address] [place], [place], [address] Phone: [phone]   Fax:  [phone]  Name: ALEIA LAROCCA MRN: [id number] Date of Birth: Nov 27, 1960

## 2020-02-20 ENCOUNTER — Telehealth: Payer: Self-pay | Admitting: Hematology and Oncology

## 2020-02-20 NOTE — Telephone Encounter (Signed)
Scheduled appts per 8/24 los. Pt's voicemail box was full. Will mail reminder letter and calendar.

## 2020-02-25 ENCOUNTER — Encounter: Payer: Self-pay | Admitting: Physical Therapy

## 2020-02-25 ENCOUNTER — Other Ambulatory Visit: Payer: Self-pay

## 2020-02-25 ENCOUNTER — Ambulatory Visit: Payer: BC Managed Care – PPO | Admitting: Physical Therapy

## 2020-02-25 DIAGNOSIS — M6281 Muscle weakness (generalized): Secondary | ICD-10-CM

## 2020-02-25 DIAGNOSIS — R278 Other lack of coordination: Secondary | ICD-10-CM

## 2020-02-25 NOTE — Therapy (Signed)
[facility] [address] [place], [place], [address] Phone: [phone]   Fax:  [phone]  Physical Therapy Treatment  Patient Details  Name: [name] [name] MRN: [id number] Date of Birth: 07/24/1960 Referring Provider (PT): Nicholas Lose, MD   Encounter Date: 02/25/2020   PT End of Session - 02/25/20 1028    Visit Number 3    Date for PT Re-Evaluation 05/05/20    Authorization Type BCBS    PT Start Time 1025    PT Stop Time 1105    PT Time Calculation (min) 40 min    Activity Tolerance Patient tolerated treatment well    Behavior During Therapy [facility] for tasks assessed/performed           Past Medical History:  Diagnosis Date  . Breast cancer ([place]) 2016   ER-/PR-/Her2+  . Breast cancer of upper-outer quadrant of right female breast ([place]) 10/11/2014  . Family history of breast cancer   . Family history of colon cancer   . Migraines    takes metoprolol  for migraine headaches  . Neurogenic bladder   . Osteopenia   . Radiation 08/06/15-09/12/15   left chest wall axillary and supraclavicular region 45 gray  . Wears contact lenses     Past Surgical History:  Procedure Laterality Date  . BREAST RECONSTRUCTION WITH PLACEMENT OF TISSUE EXPANDER AND FLEX HD (ACELLULAR HYDRATED DERMIS) Bilateral 05/19/2015   Procedure: BILATERAL NIPPLE SPARING MASTECTOMY WITH LEFT AXILLARY SENTINAL LYMPH NODE BIOPSY;  Surgeon: Erroll Luna, MD;  Location: [place];  Service: General;  Laterality: Bilateral;  . BREAST SURGERY     rt, breast biopsy  . COLONOSCOPY    . NIPPLE SPARING MASTECTOMY/SENTINAL LYMPH NODE BIOPSY/RECONSTRUCTION/PLACEMENT OF TISSUE EXPANDER Bilateral 05/19/2015   Procedure: BILATERAL NIPPLE SPARING MASTECTOMY WITH LEFT AXILLARY SENTINAL LYMPH NODE BIOPSY;  Surgeon: Erroll Luna, MD;  Location: [place];  Service: General;  Laterality: Bilateral;  . PORTACATH PLACEMENT Right 10/29/2014   Procedure: INSERTION  PORT-A-CATH WITH ULTRASOUND;  Surgeon: Erroll Luna, MD;  Location: [place];  Service: General;  Laterality: Right;  . UPPER GI ENDOSCOPY    . VEIN LIGATION     both legs    There were no vitals filed for this visit.   Subjective Assessment - 02/25/20 1028    Subjective I need to review the idea of the pelvic floor as a piston with the diaphragm.  Pt arrived 10 min late    Pertinent History breast cancer Lt sided with bil mastectomy with reconstruction 2016, chemo 2016 and radiation 2017, estrogen receptor negative, supplements are helping gut health and gas    Patient Stated Goals better strategies for pelvic symptoms    Currently in Pain? No/denies                             [facility] Adult PT Treatment/Exercise - 02/25/20 0001      Self-Care   Self-Care Other Self-Care Comments    Other Self-Care Comments  bowel and food log x 3 days      Neuro Re-ed    Neuro Re-ed Details  SL bulge and diaphragmatic training, PT gave demo and TCs/VCs, much improved by end of session      Lumbar Exercises: Seated   Other Seated Lumbar Exercises on red ball pelvic ROM circles tilts, wags x 10 each      Lumbar Exercises: Quadruped   Opposite Arm/Leg  Raise Right arm/Left leg;Left arm/Right leg;5 reps;3 seconds    Other Quadruped Lumbar Exercises quadruped rocking with VCs and TCs to open SITS bones, sink femoral heads, lift tailbone, open PF x 10 reps      Knee/Hip Exercises: Stretches   Other Knee/Hip Stretches quadruped adductor stretch x 30 sec bil      Knee/Hip Exercises: Standing   Rebounder march x 2'      Knee/Hip Exercises: Supine   Bridges Strengthening;Both;10 reps    Single Leg Bridge Both;5 reps   fig 4   Other Supine Knee/Hip Exercises bil and single leg clam green, alt Rt/Lt/bil x 15 cycles                  PT Education - 02/25/20 1050    Education Details new glute exercises    Person(s) Educated Patient    Methods  Explanation;Demonstration;Verbal cues;Handout    Comprehension Verbalized understanding;Returned demonstration            PT Short Term Goals - 02/19/20 1627      PT SHORT TERM GOAL #1   Title Pt will be ind with initial HEP and self-care strategies for pelvic floor strength and symptom management.    Status Achieved      PT SHORT TERM GOAL #2   Title Pt will be able to perform 10x5 sec holds for PF contractions without substitution in anti-gravity position.    Status On-going      PT SHORT TERM GOAL #3   Title Pt will achieve flexibility in LE adductors and hamstrings to [facility] bil.    Status On-going      PT SHORT TERM GOAL #5   Title Pt will be able to demo good diaphragmatic breathing for PF relaxation and stress management benefits.    Status Achieved             PT Long Term Goals - 02/11/20 1417      PT LONG TERM GOAL #1   Title Pt will be ind with advanced HEP and implement daily or weekly strategies for improved pelvic floor function around toileting, strength, bowel routine and sexual function.    Time 8    Period Weeks    Status New    Target Date 04/07/20      PT LONG TERM GOAL #2   Title Pt will achieve Brisol Stool Scale Type 2 or Type 3 every 1-3 days using proper toileting and splinting techniques to improve defecation ease and support prolapse.    Time 8    Period Weeks    Status New    Target Date 04/07/20      PT LONG TERM GOAL #3   Title Pt will achieve PF strength of at least 3/5 and report reduced incidence of urinary leakage by at least 50%    Time 8    Period Weeks    Status New    Target Date 04/07/20      PT LONG TERM GOAL #4   Title Pt will reduce Marinoff Score from 1/3 to 0/3 at least 50% of the time.    Baseline 1/3    Time 8    Period Weeks    Status New    Target Date 04/07/20                 Plan - 02/25/20 1249    Clinical Impression Statement Pt needed reinforcement of SL diaphragmatic breathing with exhale bulging  for improved  defecation strategy today.  She was still pulling abs in with exhale and push effort which was limiting her bulging ability.  By end of session, with repeated TCs/VCs by PT, Pt was able to demo proper anal bulge and belly big/hard.  PT progressed HEP to include hip strength as she is very weak in abd/ERs.  She had some difficulty isolating pelvic ROM on ball today.  PT gave bowel/intake log to track food/liquids and stool consistency as she still has some episodes of hard/formed stool that requires splinting and manual facilitation to move.  Continue along POC.    Comorbidities rectocele, cystocele, Hx of breast CA (estrogen receptor negative)    Rehab Potential Excellent    PT Frequency 1x / week    PT Duration 12 weeks    PT Treatment/Interventions ADLs/Self Care Home Management;Electrical Stimulation;Moist Heat;Neuromuscular re-education;Therapeutic exercise;Functional mobility training;Therapeutic activities;Patient/family education;Manual techniques;Passive range of motion;Spinal Manipulations;Joint Manipulations    PT Next Visit Plan review bowel/intake log working on stool consistency, demo abd massage, review toileting bulge in SL and sitting, review HEP updates and add SL hip abd, sit to stand with PF and hip abd band    PT Home Exercise Plan Access Code: [email or web address] (PF contractions and diaphragmatic breathing), + toileting techniques, bowel routine, the Knack, Urge Drill    Consulted and Agree with Plan of Care Patient           Patient will benefit from skilled therapeutic intervention in order to improve the following deficits and impairments:     Visit Diagnosis: Muscle weakness (generalized)  Other lack of coordination     Problem List Patient Active Problem List   Diagnosis Date Noted  . Allergic dermatitis 06/19/2015  . Hypotension 12/30/2014  . External hemorrhoid 12/13/2014  . UTI (urinary tract infection) 11/16/2014  . Genetic testing 11/07/2014  .  Migraine headache 11/06/2014  . Family history of breast cancer   . Family history of colon cancer   . Breast cancer of upper-outer quadrant of left female breast ([place]) 10/11/2014    Baruch Merl, PT 02/25/20 12:55 PM   [facility] [address] [place], [place], [address] Phone: [phone]   Fax:  [phone]  Name: [name] [name] MRN: [id number] Date of Birth: [birth year]

## 2020-02-25 NOTE — Patient Instructions (Signed)
Access Code: [email or web address] URL: [email or web address]: 08/30/2021Prepared by: Venetia Night BeuhringExercises  Supine Pelvic Floor Contraction - 8 x daily - 7 x weekly - 1 sets - 10 reps - 10 hold  Prone Pelvic Floor Contraction on Swiss Ball - 1 x daily - 7 x weekly - 3 sets - 10 reps  Sidelying Diaphragmatic Breathing - 1 x daily - 7 x weekly - 1 sets - 5 reps  Quadruped Rocking Slow - 1 x daily - 7 x weekly - 2 sets - 10 reps  Sidelying Thoracic Rotation with Open Book - 1 x daily - 7 x weekly - 1 sets - 10 reps  Supine Hip Internal and External Rotation - 1 x daily - 7 x weekly - 1 sets - 10 reps  Quadruped Adductor Stretch - 1 x daily - 7 x weekly - 1 sets - 1 reps - 30 hold  Supine Bridge - 1 x daily - 7 x weekly - 1 sets - 10 reps  Figure 4 Bridge - 1 x daily - 7 x weekly - 1 sets - 10 reps  Hooklying Clamshell with Resistance - 1 x daily - 7 x weekly - 1 sets - 10 reps  Bird Dog - 1 x daily - 7 x weekly - 1 sets - 5 reps - 5 hold

## 2020-03-05 ENCOUNTER — Other Ambulatory Visit: Payer: Self-pay

## 2020-03-05 ENCOUNTER — Ambulatory Visit: Payer: BC Managed Care – PPO | Attending: Hematology and Oncology | Admitting: Physical Therapy

## 2020-03-05 ENCOUNTER — Encounter: Payer: Self-pay | Admitting: Physical Therapy

## 2020-03-05 DIAGNOSIS — M6281 Muscle weakness (generalized): Secondary | ICD-10-CM

## 2020-03-05 DIAGNOSIS — R278 Other lack of coordination: Secondary | ICD-10-CM | POA: Diagnosis present

## 2020-03-05 NOTE — Therapy (Signed)
[facility] [address] [place], [place], [address] Phone: [phone]   Fax:  [phone]  Physical Therapy Treatment  Patient Details  Name: [name] [name] MRN: [id number] Date of Birth: Jan 23, 1961 Referring Provider (PT): Nicholas Lose, MD   Encounter Date: 03/05/2020   PT End of Session - 03/05/20 1135    Visit Number 4    Date for PT Re-Evaluation 05/05/20    Authorization Type BCBS    PT Start Time 0930    PT Stop Time 1015    PT Time Calculation (min) 45 min    Activity Tolerance Patient tolerated treatment well    Behavior During Therapy [facility] for tasks assessed/performed           Past Medical History:  Diagnosis Date  . Breast cancer ([place]) 2016   ER-/PR-/Her2+  . Breast cancer of upper-outer quadrant of right female breast ([employer]) 10/11/2014  . Family history of breast cancer   . Family history of colon cancer   . Migraines    takes metoprolol  for migraine headaches  . Neurogenic bladder   . Osteopenia   . Radiation 08/06/15-09/12/15   left chest wall axillary and supraclavicular region 45 gray  . Wears contact lenses     Past Surgical History:  Procedure Laterality Date  . BREAST RECONSTRUCTION WITH PLACEMENT OF TISSUE EXPANDER AND FLEX HD (ACELLULAR HYDRATED DERMIS) Bilateral 05/19/2015   Procedure: BILATERAL NIPPLE SPARING MASTECTOMY WITH LEFT AXILLARY SENTINAL LYMPH NODE BIOPSY;  Surgeon: Erroll Luna, MD;  Location: [place];  Service: General;  Laterality: Bilateral;  . BREAST SURGERY     rt, breast biopsy  . COLONOSCOPY    . NIPPLE SPARING MASTECTOMY/SENTINAL LYMPH NODE BIOPSY/RECONSTRUCTION/PLACEMENT OF TISSUE EXPANDER Bilateral 05/19/2015   Procedure: BILATERAL NIPPLE SPARING MASTECTOMY WITH LEFT AXILLARY SENTINAL LYMPH NODE BIOPSY;  Surgeon: Erroll Luna, MD;  Location: [place];  Service: General;  Laterality: Bilateral;  . PORTACATH PLACEMENT Right 10/29/2014   Procedure: INSERTION  PORT-A-CATH WITH ULTRASOUND;  Surgeon: Erroll Luna, MD;  Location: [place];  Service: General;  Laterality: Right;  . UPPER GI ENDOSCOPY    . VEIN LIGATION     both legs    There were no vitals filed for this visit.   Subjective Assessment - 03/05/20 0931    Subjective I am ready for a stronger band for my HEP.  I have been very consitpated.  I focused on my fluid intake and supplements and it didn't help.  But when I did go I was able to use my strategies to get Type 1 stool out.  Up to 4 times a day with small amounts.    Pertinent History breast cancer Lt sided with bil mastectomy with reconstruction 2016, chemo 2016 and radiation 2017, estrogen receptor negative, supplements are helping gut health and gas    Patient Stated Goals better strategies for pelvic symptoms    Currently in Pain? No/denies                             [facility] Adult PT Treatment/Exercise - 03/05/20 0001      Self-Care   Self-Care Other Self-Care Comments    Other Self-Care Comments  soluble fiber education on benefits and options (sweet potato example given), self-massage PF layer 1      Neuro Re-ed    Neuro Re-ed Details  supine hooklying PF 5x5 sec, seated with squatty potty  toileting posture and breathing/bulging techniques, PT gave TCs at abdominal wall for big belly hard belly on exhale      Lumbar Exercises: Stretches   Active Hamstring Stretch Left;Right;1 rep;20 seconds    Active Hamstring Stretch Limitations seated    Other Lumbar Stretch Exercise side lunge adductor stretch x 20 sec bil      Lumbar Exercises: Aerobic   Recumbent Bike L2 x 3', PT present to discuss symptoms and HEP      Manual Therapy   Manual Therapy Soft tissue mobilization    Soft tissue mobilization PF layer 1 massage, clitoral hood for fascial mobility                    PT Short Term Goals - 03/05/20 0955      PT SHORT TERM GOAL #1   Title Pt will be ind with initial HEP  and self-care strategies for pelvic floor strength and symptom management.    Baseline PT gave splinting, bowel routine, urge drill, knack, toileting techniques, PF contractions and diaphragmatic breathing at eval    Status Achieved      PT SHORT TERM GOAL #2   Title Pt will be able to perform 10x5 sec holds for PF contractions without substitution in anti-gravity position.      PT SHORT TERM GOAL #3   Title Pt will achieve flexibility in LE adductors and hamstrings to [facility] bil.    Status Achieved      PT SHORT TERM GOAL #4   Title Pt will verbalize at least 2 strategies she is implementing to estabilish improved bowel routine    Baseline toileting technique, working on fluid intake and soluble fiber increase    Status On-going      PT SHORT TERM GOAL #5   Title Pt will be able to demo good diaphragmatic breathing for PF relaxation and stress management benefits.    Status Achieved             PT Long Term Goals - 02/11/20 1417      PT LONG TERM GOAL #1   Title Pt will be ind with advanced HEP and implement daily or weekly strategies for improved pelvic floor function around toileting, strength, bowel routine and sexual function.    Time 8    Period Weeks    Status New    Target Date 04/07/20      PT LONG TERM GOAL #2   Title Pt will achieve Brisol Stool Scale Type 2 or Type 3 every 1-3 days using proper toileting and splinting techniques to improve defecation ease and support prolapse.    Time 8    Period Weeks    Status New    Target Date 04/07/20      PT LONG TERM GOAL #3   Title Pt will achieve PF strength of at least 3/5 and report reduced incidence of urinary leakage by at least 50%    Time 8    Period Weeks    Status New    Target Date 04/07/20      PT LONG TERM GOAL #4   Title Pt will reduce Marinoff Score from 1/3 to 0/3 at least 50% of the time.    Baseline 1/3    Time 8    Period Weeks    Status New    Target Date 04/07/20                  Plan - 03/05/20  1137    Clinical Impression Statement Pt has met most of her STGs.  She continues to have good initial recruitment of PF muscles but very poor endurance.  She has both a rectocele and cystocele.  She has some fascial restrictions around clitoral hood and layer 1 PF so PT performed STM to these areas today.  PT discussed role of layer 1 for intimacy today and self-care for these muscles to help them function more effectively.  Pt is making progress on toileting but continues to have Type 1 stool.  She had forgotten her bowel/food log but on discussion it sounds like she needs more soluble fiber in diet so this was discussed and PT advised.  She is using splinting around rectum/anus to help defecation.  PT advised she try SL for PF contractions to see if endurance is more successful there.  Continue along POC.    Comorbidities rectocele, cystocele, Hx of breast CA (estrogen receptor negative)    Rehab Potential Excellent    PT Frequency 1x / week    PT Duration 12 weeks    PT Treatment/Interventions ADLs/Self Care Home Management;Electrical Stimulation;Moist Heat;Neuromuscular re-education;Therapeutic exercise;Functional mobility training;Therapeutic activities;Patient/family education;Manual techniques;Passive range of motion;Spinal Manipulations;Joint Manipulations    PT Next Visit Plan f/u on soluble fiber, SL PF contractions (any better?), try biofeedback, f/u on layer 1 and clitoral hood massage and self-care to this area    PT Home Exercise Plan Access Code: [email or web address] (PF contractions and diaphragmatic breathing), + toileting techniques, bowel routine, the Knack, Urge Drill    Consulted and Agree with Plan of Care Patient           Patient will benefit from skilled therapeutic intervention in order to improve the following deficits and impairments:     Visit Diagnosis: Muscle weakness (generalized)  Other lack of coordination     Problem List Patient Active Problem List    Diagnosis Date Noted  . Allergic dermatitis 06/19/2015  . Hypotension 12/30/2014  . External hemorrhoid 12/13/2014  . UTI (urinary tract infection) 11/16/2014  . Genetic testing 11/07/2014  . Migraine headache 11/06/2014  . Family history of breast cancer   . Family history of colon cancer   . Breast cancer of upper-outer quadrant of left female breast ([place]) 10/11/2014    Baruch Merl, PT 03/05/20 11:43 AM    [facility] [address], [place], [place], [address] Phone: [phone]   Fax:  [phone]  Name: ALYSIAH SUPPA MRN: [id number] Date of Birth: [birth year]

## 2020-03-07 ENCOUNTER — Encounter: Payer: BLUE CROSS/BLUE SHIELD | Admitting: Physical Therapy

## 2020-03-11 ENCOUNTER — Encounter: Payer: Self-pay | Admitting: Physical Therapy

## 2020-03-11 ENCOUNTER — Ambulatory Visit: Payer: BC Managed Care – PPO | Admitting: Physical Therapy

## 2020-03-11 ENCOUNTER — Other Ambulatory Visit: Payer: Self-pay

## 2020-03-11 DIAGNOSIS — R278 Other lack of coordination: Secondary | ICD-10-CM

## 2020-03-11 DIAGNOSIS — M6281 Muscle weakness (generalized): Secondary | ICD-10-CM | POA: Diagnosis not present

## 2020-03-11 NOTE — Therapy (Signed)
[facility] [address], [place], [place], [address] Phone: [phone]   Fax:  [phone]  Physical Therapy Treatment  Patient Details  Name: [name] [name] MRN: [id number] Date of Birth: [birth year] Referring Provider (PT): Nicholas Lose, MD   Encounter Date: 03/11/2020   PT End of Session - 03/11/20 1104    Visit Number 5    Date for PT Re-Evaluation 05/05/20    Authorization Type BCBS    PT Start Time 1020    PT Stop Time 1102    PT Time Calculation (min) 42 min    Activity Tolerance Patient tolerated treatment well    Behavior During Therapy [facility] for tasks assessed/performed           Past Medical History:  Diagnosis Date  . Breast cancer ([place]) 2016   ER-/PR-/Her2+  . Breast cancer of upper-outer quadrant of right female breast ([place]) 10/11/2014  . Family history of breast cancer   . Family history of colon cancer   . Migraines    takes metoprolol  for migraine headaches  . Neurogenic bladder   . Osteopenia   . Radiation 08/06/15-09/12/15   left chest wall axillary and supraclavicular region 45 gray  . Wears contact lenses     Past Surgical History:  Procedure Laterality Date  . BREAST RECONSTRUCTION WITH PLACEMENT OF TISSUE EXPANDER AND FLEX HD (ACELLULAR HYDRATED DERMIS) Bilateral 05/19/2015   Procedure: BILATERAL NIPPLE SPARING MASTECTOMY WITH LEFT AXILLARY SENTINAL LYMPH NODE BIOPSY;  Surgeon: Erroll Luna, MD;  Location: [place];  Service: General;  Laterality: Bilateral;  . BREAST SURGERY     rt, breast biopsy  . COLONOSCOPY    . NIPPLE SPARING MASTECTOMY/SENTINAL LYMPH NODE BIOPSY/RECONSTRUCTION/PLACEMENT OF TISSUE EXPANDER Bilateral 05/19/2015   Procedure: BILATERAL NIPPLE SPARING MASTECTOMY WITH LEFT AXILLARY SENTINAL LYMPH NODE BIOPSY;  Surgeon: Erroll Luna, MD;  Location: [place];  Service: General;  Laterality: Bilateral;  . PORTACATH PLACEMENT Right 10/29/2014   Procedure: INSERTION  PORT-A-CATH WITH ULTRASOUND;  Surgeon: Erroll Luna, MD;  Location: [place];  Service: General;  Laterality: Right;  . UPPER GI ENDOSCOPY    . VEIN LIGATION     both legs    There were no vitals filed for this visit.   Subjective Assessment - 03/11/20 1020    Subjective I have been traveling and working so haven't done my HEP over last week.  Still ongoing signif constipation but the methods I'm learning do help some.  Pt has bowel and intake log.  Had one very long soft BM when I returned from beach.    Pertinent History breast cancer Lt sided with bil mastectomy with reconstruction 2016, chemo 2016 and radiation 2017, estrogen receptor negative, supplements are helping gut health and gas    Patient Stated Goals better strategies for pelvic symptoms    Currently in Pain? No/denies                             [facility] Adult PT Treatment/Exercise - 03/11/20 0001      Self-Care   Self-Care Other Self-Care Comments    Other Self-Care Comments  review of bowel log, fiber info and suggestions for ways to increase into current diet regimen   more fruits, veggies, add flax or chia seed to smoothies     Neuro Re-ed    Neuro Re-ed Details  biofeedback training with breath control, follow the blue line  perineal control program, PT VC/TCs for breath control, add in ball squeeze and bridge, idea of knitting ribcage                  PT Education - 03/11/20 1029    Education Details fiber info, review of log and suggestions for alternative additions for increased fiber into diet    Person(s) Educated Patient    Methods Explanation;Handout    Comprehension Verbalized understanding            PT Short Term Goals - 03/05/20 0955      PT SHORT TERM GOAL #1   Title Pt will be ind with initial HEP and self-care strategies for pelvic floor strength and symptom management.    Baseline PT gave splinting, bowel routine, urge drill, knack, toileting  techniques, PF contractions and diaphragmatic breathing at eval    Status Achieved      PT SHORT TERM GOAL #2   Title Pt will be able to perform 10x5 sec holds for PF contractions without substitution in anti-gravity position.      PT SHORT TERM GOAL #3   Title Pt will achieve flexibility in LE adductors and hamstrings to [facility] bil.    Status Achieved      PT SHORT TERM GOAL #4   Title Pt will verbalize at least 2 strategies she is implementing to estabilish improved bowel routine    Baseline toileting technique, working on fluid intake and soluble fiber increase    Status On-going      PT SHORT TERM GOAL #5   Title Pt will be able to demo good diaphragmatic breathing for PF relaxation and stress management benefits.    Status Achieved             PT Long Term Goals - 02/11/20 1417      PT LONG TERM GOAL #1   Title Pt will be ind with advanced HEP and implement daily or weekly strategies for improved pelvic floor function around toileting, strength, bowel routine and sexual function.    Time 8    Period Weeks    Status New    Target Date 04/07/20      PT LONG TERM GOAL #2   Title Pt will achieve Brisol Stool Scale Type 2 or Type 3 every 1-3 days using proper toileting and splinting techniques to improve defecation ease and support prolapse.    Time 8    Period Weeks    Status New    Target Date 04/07/20      PT LONG TERM GOAL #3   Title Pt will achieve PF strength of at least 3/5 and report reduced incidence of urinary leakage by at least 50%    Time 8    Period Weeks    Status New    Target Date 04/07/20      PT LONG TERM GOAL #4   Title Pt will reduce Marinoff Score from 1/3 to 0/3 at least 50% of the time.    Baseline 1/3    Time 8    Period Weeks    Status New    Target Date 04/07/20                 Plan - 03/11/20 1154    Clinical Impression Statement PT introduced pelvic floor biofeedback today with Pt able to display good activation of PF.  She  had trouble following along with control program.  She has great difficulty coordinating focused breathwork  with PF contractions as she was intitially bulging as though toileting vs lifting.  She had improved deep core and pelvic control without overusing prime movers with supine dead bug without breath focus although she reported she had limited awareness of her deep core working. She is likely used to using prime movers as her stabilizers so will need ongoing downtraining of this strategy to improve awareness of deep core and its function and role.    Comorbidities rectocele, cystocele, Hx of breast CA (estrogen receptor negative)    PT Frequency 1x / week    PT Duration 12 weeks    PT Treatment/Interventions ADLs/Self Care Home Management;Electrical Stimulation;Moist Heat;Neuromuscular re-education;Therapeutic exercise;Functional mobility training;Therapeutic activities;Patient/family education;Manual techniques;Passive range of motion;Spinal Manipulations;Joint Manipulations    PT Next Visit Plan f/u on fiber intake, constipation, practice deep core without breathwork focus - try deadbug series, supine weights overhead, SL clam and hip abd, quadruped    PT Home Exercise Plan Access Code: [email or web address] (PF contractions and diaphragmatic breathing), + toileting techniques, bowel routine, the Knack, Urge Drill    Consulted and Agree with Plan of Care Patient           Patient will benefit from skilled therapeutic intervention in order to improve the following deficits and impairments:     Visit Diagnosis: Muscle weakness (generalized)  Other lack of coordination     Problem List Patient Active Problem List   Diagnosis Date Noted  . Allergic dermatitis 06/19/2015  . Hypotension 12/30/2014  . External hemorrhoid 12/13/2014  . UTI (urinary tract infection) 11/16/2014  . Genetic testing 11/07/2014  . Migraine headache 11/06/2014  . Family history of breast cancer   . Family history of colon  cancer   . Breast cancer of upper-outer quadrant of left female breast ([place]) 10/11/2014    [name] [name], PT 03/11/20 12:00 PM   [facility] [address] [place], [place], [address] Phone: [phone]   Fax:  [phone]  Name: [name] [name] MRN: [id number] Date of Birth: [birth year]

## 2020-03-11 NOTE — Patient Instructions (Signed)
Fiber Overview Dietary fiber is the part of plants that can't be digested. There are 2 kinds of dietary fiber: insoluble and soluble.    Insoluble fiber adds bulk to keep foods moving through the digestive system. Foods high in insoluble fiber can improve gut motility (wavelike contractions of the intestines) for those experiencing constipation. Soluble fiber holds water, which softens the stool for easy bowel movements. Foods high in soluble fiber can add bulk for those experiencing high frequency or loose stool/diarrhea.   Fiber is an important part of your diet, even though it passes through your body undigested and has no nutritional value.   A high fiber diet can:  promote regular bowel movements treat diverticular disease (inflammation of part of the intestine) and irritable bowel syndrome (abdominal pain, diarrhea, and constipation that come and go) promote improvement in hemorrhoids, constipation and fecal incontinence.  You should have at least 14 grams of fiber for every 1000 calories you eat every day. Read the label on every food package to find out how much fiber a serving of the food will provide. Foods containing more than 20% of the daily value of fiber per serving are considered high in fiber.  Soluble fiber foods: White rice Oatmeal Pasta Mashed potatoes Sweet potatoes Bananas Applesauce Hard cheeses Yogurt   Insoluble fiber foods: whole grain breads and cereals  nuts and seeds raw or dried fruits leafy vegetables like spinach and lettuce fruits and vegetables  Note: caffeine may trigger more bowel movements.  Anal Irritation: this may occur due to high frequency of bowel movements.  To reduce irritation: cleanse anal area with water only pat dry or use a cool hair dryer avoid soap in anal area do not wipe with toilet paper, or use toilet paper moistened with water use a squirt bottle filled with water to cleanse you can experiment with baby wipes or other  personal wipes if you wish, especially when out and using water is not convenient

## 2020-03-18 ENCOUNTER — Encounter: Payer: BC Managed Care – PPO | Admitting: Physical Therapy

## 2020-03-19 ENCOUNTER — Encounter: Payer: BC Managed Care – PPO | Admitting: Physical Therapy

## 2020-03-25 ENCOUNTER — Encounter: Payer: Self-pay | Admitting: Physical Therapy

## 2020-03-25 ENCOUNTER — Ambulatory Visit: Payer: BC Managed Care – PPO | Admitting: Physical Therapy

## 2020-03-25 ENCOUNTER — Other Ambulatory Visit: Payer: Self-pay

## 2020-03-25 DIAGNOSIS — M6281 Muscle weakness (generalized): Secondary | ICD-10-CM | POA: Diagnosis not present

## 2020-03-25 DIAGNOSIS — R278 Other lack of coordination: Secondary | ICD-10-CM

## 2020-03-25 NOTE — Therapy (Signed)
[facility] [address], [place], [place], [address] Phone: [phone]   Fax:  [phone]  Physical Therapy Treatment  Patient Details  Name: [name] [name] MRN: [id number] Date of Birth: Apr 30, 1961 Referring Provider (PT): Nicholas Lose, MD   Encounter Date: 03/25/2020   PT End of Session - 03/25/20 1023    Visit Number 6    Date for PT Re-Evaluation 05/05/20    Authorization Type BCBS    PT Start Time 1015    PT Stop Time 1100    PT Time Calculation (min) 45 min    Activity Tolerance Patient tolerated treatment well    Behavior During Therapy [facility] for tasks assessed/performed           Past Medical History:  Diagnosis Date  . Breast cancer ([place]) 2016   ER-/PR-/Her2+  . Breast cancer of upper-outer quadrant of right female breast ([place]) 10/11/2014  . Family history of breast cancer   . Family history of colon cancer   . Migraines    takes metoprolol  for migraine headaches  . Neurogenic bladder   . Osteopenia   . Radiation 08/06/15-09/12/15   left chest wall axillary and supraclavicular region 45 gray  . Wears contact lenses     Past Surgical History:  Procedure Laterality Date  . BREAST RECONSTRUCTION WITH PLACEMENT OF TISSUE EXPANDER AND FLEX HD (ACELLULAR HYDRATED DERMIS) Bilateral 05/19/2015   Procedure: BILATERAL NIPPLE SPARING MASTECTOMY WITH LEFT AXILLARY SENTINAL LYMPH NODE BIOPSY;  Surgeon: Erroll Luna, MD;  Location: [place];  Service: General;  Laterality: Bilateral;  . BREAST SURGERY     rt, breast biopsy  . COLONOSCOPY    . NIPPLE SPARING MASTECTOMY/SENTINAL LYMPH NODE BIOPSY/RECONSTRUCTION/PLACEMENT OF TISSUE EXPANDER Bilateral 05/19/2015   Procedure: BILATERAL NIPPLE SPARING MASTECTOMY WITH LEFT AXILLARY SENTINAL LYMPH NODE BIOPSY;  Surgeon: Erroll Luna, MD;  Location: [place];  Service: General;  Laterality: Bilateral;  . PORTACATH PLACEMENT Right 10/29/2014   Procedure: INSERTION  PORT-A-CATH WITH ULTRASOUND;  Surgeon: Erroll Luna, MD;  Location: [place];  Service: General;  Laterality: Right;  . UPPER GI ENDOSCOPY    . VEIN LIGATION     both legs    There were no vitals filed for this visit.   Subjective Assessment - 03/25/20 1016    Subjective I have added all kinds of fiber and water and still having BMs like rocks.  I felt initial success when learning new tricks but now I don't feel like I'm successful.  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  I am pooping 3-5x/day, sometimes at work.  Maybe three rocks at a time - not a lot of volume.  I have a good soft formed stool 1-2x/week.    Pertinent History breast cancer Lt sided with bil mastectomy with reconstruction 2016, chemo 2016 and radiation 2017, estrogen receptor negative, supplements are helping gut health and gas    Patient Stated Goals better strategies for pelvic symptoms    Currently in Pain? No/denies                             [facility] Adult PT Treatment/Exercise - 03/25/20 0001      Self-Care   Self-Care Other Self-Care Comments    Other Self-Care Comments  Fem[employer] videos first two for HEP, glycerin suppository use, chew food with mindfulness and thoroughly, stress management for pelvic tension, review of toileting posture  Neuro Re-ed    Neuro Re-ed Details  SL 10x5 sec PF contractions, SL and sitting on yoga blocks with squatty potty toileting breathe and bulge with PT palpation at anus for feedback and info gathering      Lumbar Exercises: Supine   Other Supine Lumbar Exercises diaphragmatic breathing x 5' with TCs by PT                    PT Short Term Goals - 03/25/20 1026      PT SHORT TERM GOAL #1   Title Pt will be ind with initial HEP and self-care strategies for pelvic floor strength and symptom management.    Baseline PT gave splinting, bowel routine, urge drill, knack, toileting techniques, PF contractions and diaphragmatic breathing at eval     Status Achieved      PT SHORT TERM GOAL #2   Title Pt will be able to perform 10x5 sec holds for PF contractions without substitution in anti-gravity position.    Status Achieved      PT SHORT TERM GOAL #3   Title Pt will achieve flexibility in LE adductors and hamstrings to [facility] bil.    Status Achieved      PT SHORT TERM GOAL #4   Title Pt will verbalize at least 2 strategies she is implementing to estabilish improved bowel routine    Status Achieved      PT SHORT TERM GOAL #5   Title Pt will be able to demo good diaphragmatic breathing for PF relaxation and stress management benefits.    Status Achieved             PT Long Term Goals - 03/25/20 1031      PT LONG TERM GOAL #1   Title Pt will be ind with advanced HEP and implement daily or weekly strategies for improved pelvic floor function around toileting, strength, bowel routine and sexual function.    Status On-going      PT LONG TERM GOAL #2   Title Pt will achieve Brisol Stool Scale Type 2 or Type 3 every 1-3 days using proper toileting and splinting techniques to improve defecation ease and support prolapse.    Baseline Type 1 ongoing 3-5x/day and 1-2 Type 4 weekly    Status On-going      PT LONG TERM GOAL #3   Title Pt will achieve PF strength of at least 3/5 and report reduced incidence of urinary leakage by at least 50%    Status On-going      PT LONG TERM GOAL #4   Title Pt will reduce Marinoff Score from 1/3 to 0/3 at least 50% of the time.    Status Achieved                 Plan - 03/25/20 1201    Clinical Impression Statement Pt arrived frustrated with ongoing consitpation.  She feels she initially improved with strategies learned in PT but has gone back to baseline which is 3-5 episodes of small quantities of Type 1 stool daily, and 1-2 Type 3 or 4 1-2x/week.  This is becoming stressful espeically when needing to defecate at work.  PT re-iterated bowel routine and that it takes time to change  patterns.  PT also encouraged Pt to try glycerine suppository as needed to help soften and initiate defecation.  Pt also admitted she eats too fast and may need to chew more.  PT cued Pt to repeat breath cycles during  defecation and not to keep pushing at end of exhale.  Pt is compliant with water and fiber diet changes.  She has met all LTGs and showed signif improvement today with ability to perform deep diaphragmatic breaths and coordinate bulge and contract in both SL and seated.  She was able to perform 10x5 sec PF contractions for the first time today with improved breath control.  PT also educated Pt about how stress can influence pelvic tension and gave YouTube FemFusion videos to help with pelvic drop and pelvic meditation.  PT will educate Pt on abdominal massage next visit.    Comorbidities rectocele, cystocele, Hx of breast CA (estrogen receptor negative)    PT Frequency 1x / week    PT Duration 12 weeks    PT Treatment/Interventions ADLs/Self Care Home Management;Electrical Stimulation;Moist Heat;Neuromuscular re-education;Therapeutic exercise;Functional mobility training;Therapeutic activities;Patient/family education;Manual techniques;Passive range of motion;Spinal Manipulations;Joint Manipulations    PT Next Visit Plan DO ABDOMINAL MASSAGE, f/u on FemFusion videos 1&2, trunk circles on toilet, is Pt repeating breath cycles when pushing, glycerin suppository use, does Pt want more stress management options, is Pt allowing enough time for toileting?    PT Home Exercise Plan Access Code: [email or web address] (PF contractions and diaphragmatic breathing), + toileting techniques, bowel routine, the Knack, Urge Drill    Consulted and Agree with Plan of Care Patient           Patient will benefit from skilled therapeutic intervention in order to improve the following deficits and impairments:     Visit Diagnosis: Muscle weakness (generalized)  Other lack of coordination     Problem List Patient  Active Problem List   Diagnosis Date Noted  . Allergic dermatitis 06/19/2015  . Hypotension 12/30/2014  . External hemorrhoid 12/13/2014  . UTI (urinary tract infection) 11/16/2014  . Genetic testing 11/07/2014  . Migraine headache 11/06/2014  . Family history of breast cancer   . Family history of colon cancer   . Breast cancer of upper-outer quadrant of left female breast ([place]) 10/11/2014    Baruch Merl, PT 03/25/20 12:11 PM   [facility] [address], [place], [place], [address] Phone: [phone]   Fax:  [phone]  Name: GLADIES SOFRANKO MRN: [id number] Date of Birth: 02-Aug-1960

## 2020-03-25 NOTE — Patient Instructions (Signed)
   The "Pelvic Drop" to Release Pelvic Floor Tension: Three Visualizations     Guided Meditation for Pelvic Floor Relaxation  [employer]   Pelvic Floor Release Stretches  [employer]    Pelvic Floor Release Stretches (NEW)  [employer]   

## 2020-04-01 ENCOUNTER — Ambulatory Visit: Payer: BC Managed Care – PPO | Attending: Hematology and Oncology | Admitting: Physical Therapy

## 2020-04-01 ENCOUNTER — Other Ambulatory Visit: Payer: Self-pay

## 2020-04-01 ENCOUNTER — Encounter: Payer: Self-pay | Admitting: Physical Therapy

## 2020-04-01 DIAGNOSIS — R278 Other lack of coordination: Secondary | ICD-10-CM | POA: Diagnosis present

## 2020-04-01 DIAGNOSIS — M6281 Muscle weakness (generalized): Secondary | ICD-10-CM | POA: Diagnosis present

## 2020-04-01 NOTE — Patient Instructions (Signed)
° °  The "Pelvic Drop" to Release Pelvic Floor Tension: Three Visualizations  ° ° ° °Guided Meditation for Pelvic Floor Relaxation   [employer] ° ° °Pelvic Floor Release Stretches   [employer]  ° ° °Pelvic Floor Release Stretches (NEW)   [employer]  ° ° °Stress Management and Relaxation Techniques °There are two divisions in the nervous system that run many of our body's "behind the scenes" functions.  The “fight or flight” nervous system, and the “calming, rest and digest” nervous system.  These two systems have opposite effects on our body organs and systems and can impact our heart rate, blood pressure, breath rate, temperature, GI function, and experience of stress or calm.   ° °Taking time to stimulate the “calming, rest and digest” part of our nervous system can help reduce experience of symptoms of chronic pain conditions, decrease stress and anxiety, and allow us to feel more equipped to handle challenges.  Below are strategies, techniques, and video suggestions to help stimulate this calming system.   °  °Ways to Calm the Nervous System °Yoga °Meditation °Mindfulness  °Stretching °Exercise °Deep, slow breathing into belly (diaphragmatic breathing) with focused prolonged exhale °Monotasking vs Multi-tasking (do one activity daily that is simple, focused, and slowly performed) °Listen to your biorhythms: sleep when tired, rise when rested, eat when hungry, stop when full, etc °Social connections - seek connections with others °Laughter - laughing helps stimulate our “calming” nervous system °Massage - by a practitioner or self-massage (example, feet for reflexology points) °Singing or humming °Cold exposure - try 30 sec of cold water at the end of your shower ° ° °Meditation, Yoga, Breathing, Stretching Video Suggestions °[employer] YouTube Videos: °• Guided Meditation for Pelvic Floor Relaxation - [employer] YouTube video °• 10-Min Breath Meditation for Pelvic Health and  Healing - [employer] YouTube video °• Pelvic Floor Release Stretches °• Bedtime Yoga for Pelvic Tension °• Pelvic Floor Release and Inner Thigh Stretch - Yoga for Pelvic Health (approx. 40 min) °Progressive Muscle Relaxation Exercises - search Edmond Jacobson exercise videos on YouTube °• Focused relaxation through guided relaxation, breathing, and contractions/relaxations of various muscle groups °Autogenic Relaxation Technique - search videos on YouTube °• Uses body's natural relaxation response through guided meditation, inducing heaviness in body, and verbal stimuli/affirmations to create overall feeling of well-being, slowed breathing, reduced heart rate, reduced blood pressure, reduced stress/anxiety °Sympathetic Breathing Meditation - search videos on YouTube °• Regulate the nervous system and restore calm through focused breathing to stimulate the parasympathetic nervous system (the opposite of our “fight or flight” sympathetic nervous system) °Mindfulness Meditation - search videos on YouTube °• Focuses on choosing to be present in the moment, finding enjoyment in the now °Diaphragmatic Breathing - search videos on YouTube °Guided Imagery for Relaxation - search videos on YouTube ° °

## 2020-04-01 NOTE — Therapy (Signed)
[facility] [address], [place], [place], [address] Phone: [phone]   Fax:  [phone]  Physical Therapy Treatment  Patient Details  Name: [name] [name] MRN: [id number] Date of Birth: 1960-07-12 Referring Provider (PT): Nicholas Lose, MD   Encounter Date: 04/01/2020   PT End of Session - 04/01/20 1614    Visit Number 7    Date for PT Re-Evaluation 05/05/20    Authorization Type BCBS    PT Start Time 1615    PT Stop Time 1655    PT Time Calculation (min) 40 min    Activity Tolerance Patient tolerated treatment well    Behavior During Therapy [facility] for tasks assessed/performed           Past Medical History:  Diagnosis Date  . Breast cancer ([place]) 2016   ER-/PR-/Her2+  . Breast cancer of upper-outer quadrant of right female breast ([place]) 10/11/2014  . Family history of breast cancer   . Family history of colon cancer   . Migraines    takes metoprolol  for migraine headaches  . Neurogenic bladder   . Osteopenia   . Radiation 08/06/15-09/12/15   left chest wall axillary and supraclavicular region 45 gray  . Wears contact lenses     Past Surgical History:  Procedure Laterality Date  . BREAST RECONSTRUCTION WITH PLACEMENT OF TISSUE EXPANDER AND FLEX HD (ACELLULAR HYDRATED DERMIS) Bilateral 05/19/2015   Procedure: BILATERAL NIPPLE SPARING MASTECTOMY WITH LEFT AXILLARY SENTINAL LYMPH NODE BIOPSY;  Surgeon: Erroll Luna, MD;  Location: [place];  Service: General;  Laterality: Bilateral;  . BREAST SURGERY     rt, breast biopsy  . COLONOSCOPY    . NIPPLE SPARING MASTECTOMY/SENTINAL LYMPH NODE BIOPSY/RECONSTRUCTION/PLACEMENT OF TISSUE EXPANDER Bilateral 05/19/2015   Procedure: BILATERAL NIPPLE SPARING MASTECTOMY WITH LEFT AXILLARY SENTINAL LYMPH NODE BIOPSY;  Surgeon: Erroll Luna, MD;  Location: [place];  Service: General;  Laterality: Bilateral;  . PORTACATH PLACEMENT Right 10/29/2014   Procedure: INSERTION  PORT-A-CATH WITH ULTRASOUND;  Surgeon: Erroll Luna, MD;  Location: [place];  Service: General;  Laterality: Right;  . UPPER GI ENDOSCOPY    . VEIN LIGATION     both legs    There were no vitals filed for this visit.   Subjective Assessment - 04/01/20 1615    Subjective I have had 2 BMs in a row (2 days in a row) that start as a type 1 and finish as a large type 4.  I am taking a break from the supplements for a week and will restart next week.  I used a glycerin suppository over the weekend which helped.  I am chewing my food more.    Pertinent History breast cancer Lt sided with bil mastectomy with reconstruction 2016, chemo 2016 and radiation 2017, estrogen receptor negative, supplements are helping gut health and gas    Patient Stated Goals better strategies for pelvic symptoms                             [facility] Adult PT Treatment/Exercise - 04/01/20 0001      Self-Care   Self-Care Other Self-Care Comments    Other Self-Care Comments  abdominal wall massage kneading, circles, tapping, vibration, eating full meal consistently to stim gastrocolic reflex, ongoing bowel routine education      Manual Therapy   Manual Therapy Soft tissue mobilization    Soft tissue mobilization abdominal massage kneading,  circles, vibration, tapping, PT noted good gut sounds with this                  PT Education - 04/01/20 1706    Education Details stress management via vagus nerve stimulation, videos to try at home, ongoing bowel routine coaching    Person(s) Educated Patient    Methods Explanation;Handout    Comprehension Verbalized understanding            PT Short Term Goals - 03/25/20 1026      PT SHORT TERM GOAL #1   Title Pt will be ind with initial HEP and self-care strategies for pelvic floor strength and symptom management.    Baseline PT gave splinting, bowel routine, urge drill, knack, toileting techniques, PF contractions and  diaphragmatic breathing at eval    Status Achieved      PT SHORT TERM GOAL #2   Title Pt will be able to perform 10x5 sec holds for PF contractions without substitution in anti-gravity position.    Status Achieved      PT SHORT TERM GOAL #3   Title Pt will achieve flexibility in LE adductors and hamstrings to [facility] bil.    Status Achieved      PT SHORT TERM GOAL #4   Title Pt will verbalize at least 2 strategies she is implementing to estabilish improved bowel routine    Status Achieved      PT SHORT TERM GOAL #5   Title Pt will be able to demo good diaphragmatic breathing for PF relaxation and stress management benefits.    Status Achieved             PT Long Term Goals - 03/25/20 1031      PT LONG TERM GOAL #1   Title Pt will be ind with advanced HEP and implement daily or weekly strategies for improved pelvic floor function around toileting, strength, bowel routine and sexual function.    Status On-going      PT LONG TERM GOAL #2   Title Pt will achieve Brisol Stool Scale Type 2 or Type 3 every 1-3 days using proper toileting and splinting techniques to improve defecation ease and support prolapse.    Baseline Type 1 ongoing 3-5x/day and 1-2 Type 4 weekly    Status On-going      PT LONG TERM GOAL #3   Title Pt will achieve PF strength of at least 3/5 and report reduced incidence of urinary leakage by at least 50%    Status On-going      PT LONG TERM GOAL #4   Title Pt will reduce Marinoff Score from 1/3 to 0/3 at least 50% of the time.    Status Achieved                 Plan - 04/01/20 1709    Clinical Impression Statement Pt has had two BMs over two consecutive days, each which start with a Type 1 but finish with a large Type 4.  Pt is working on taking time to chew foods more.  She is a "grazer" when eating so PT educated her that eating does stimulate the gastrocolic reflex.  PT intiated abdominal massage and instructed Pt in how to perform and noted increased  gut sounds with this.  Pt needed another copy of pelvic meditation videos today.  PT also educated Pt about the vagus nerve and its role with digestion.  PT gave ideas of ways to stimulate this as  Pt admits she operates with high stress levels. PT with ongoing assessment of response to treatment.    Comorbidities rectocele, cystocele, Hx of breast CA (estrogen receptor negative)    PT Next Visit Plan f/u on abdominal massage and do again, f/u on pelvic meditation/stress management videos    PT Home Exercise Plan Access Code: [email or web address] (PF contractions and diaphragmatic breathing), + toileting techniques, bowel routine, the Knack, Urge Drill    Consulted and Agree with Plan of Care Patient           Patient will benefit from skilled therapeutic intervention in order to improve the following deficits and impairments:     Visit Diagnosis: Muscle weakness (generalized)  Other lack of coordination     Problem List Patient Active Problem List   Diagnosis Date Noted  . Allergic dermatitis 06/19/2015  . Hypotension 12/30/2014  . External hemorrhoid 12/13/2014  . UTI (urinary tract infection) 11/16/2014  . Genetic testing 11/07/2014  . Migraine headache 11/06/2014  . Family history of breast cancer   . Family history of colon cancer   . Breast cancer of upper-outer quadrant of left female breast ([place]) 10/11/2014    Baruch Merl, PT 04/01/20 5:18 PM   [facility] [address] [place], [place], [address] Phone: [phone]   Fax:  [phone]  Name: [name] [name] MRN: [id number] Date of Birth: September 07, 1960

## 2020-04-08 ENCOUNTER — Encounter: Payer: Self-pay | Admitting: Physical Therapy

## 2020-04-08 ENCOUNTER — Other Ambulatory Visit: Payer: Self-pay

## 2020-04-08 ENCOUNTER — Ambulatory Visit: Payer: BC Managed Care – PPO | Admitting: Physical Therapy

## 2020-04-08 DIAGNOSIS — R278 Other lack of coordination: Secondary | ICD-10-CM

## 2020-04-08 DIAGNOSIS — M6281 Muscle weakness (generalized): Secondary | ICD-10-CM | POA: Diagnosis not present

## 2020-04-08 NOTE — Therapy (Addendum)
[facility] [address], [place], [place], [address] Phone: [phone]   Fax:  [phone]  Physical Therapy Treatment  Patient Details  Name: [name] [name] MRN: [id number] Date of Birth: [birth year] Referring Provider (PT): Nicholas Lose, MD   Encounter Date: 04/08/2020   PT End of Session - 04/08/20 1457    Visit Number 8    Date for PT Re-Evaluation 05/05/20    Authorization Type BCBS    PT Start Time 1452    PT Stop Time 1530    PT Time Calculation (min) 38 min    Activity Tolerance Patient tolerated treatment well    Behavior During Therapy [facility] for tasks assessed/performed           Past Medical History:  Diagnosis Date  . Breast cancer ([place]) 2016   ER-/PR-/Her2+  . Breast cancer of upper-outer quadrant of right female breast ([place]) 10/11/2014  . Family history of breast cancer   . Family history of colon cancer   . Migraines    takes metoprolol  for migraine headaches  . Neurogenic bladder   . Osteopenia   . Radiation 08/06/15-09/12/15   left chest wall axillary and supraclavicular region 45 gray  . Wears contact lenses     Past Surgical History:  Procedure Laterality Date  . BREAST RECONSTRUCTION WITH PLACEMENT OF TISSUE EXPANDER AND FLEX HD (ACELLULAR HYDRATED DERMIS) Bilateral 05/19/2015   Procedure: BILATERAL NIPPLE SPARING MASTECTOMY WITH LEFT AXILLARY SENTINAL LYMPH NODE BIOPSY;  Surgeon: Erroll Luna, MD;  Location: [place];  Service: General;  Laterality: Bilateral;  . BREAST SURGERY     rt, breast biopsy  . COLONOSCOPY    . NIPPLE SPARING MASTECTOMY/SENTINAL LYMPH NODE BIOPSY/RECONSTRUCTION/PLACEMENT OF TISSUE EXPANDER Bilateral 05/19/2015   Procedure: BILATERAL NIPPLE SPARING MASTECTOMY WITH LEFT AXILLARY SENTINAL LYMPH NODE BIOPSY;  Surgeon: Erroll Luna, MD;  Location: [place];  Service: General;  Laterality: Bilateral;  . PORTACATH PLACEMENT Right 10/29/2014   Procedure: INSERTION  PORT-A-CATH WITH ULTRASOUND;  Surgeon: Erroll Luna, MD;  Location: [place];  Service: General;  Laterality: Right;  . UPPER GI ENDOSCOPY    . VEIN LIGATION     both legs    There were no vitals filed for this visit.   Subjective Assessment - 04/08/20 1452    Subjective I watched the pelvic relaxation video.  I just went back on my supplements.  Yesterday was a bad day for BM but today was better - Type 1 and Type 2 but came out more easily.    Pertinent History breast cancer Lt sided with bil mastectomy with reconstruction 2016, chemo 2016 and radiation 2017, estrogen receptor negative, supplements are helping gut health and gas    Patient Stated Goals better strategies for pelvic symptoms    Currently in Pain? No/denies              [facility] PT Assessment - 04/08/20 0001      Assessment   Medical Diagnosis C50.412,Z17.1 (ICD-10-CM) - Malignant neoplasm of upper-outer quadrant of left breast in female, estrogen receptor negative [facility])    Referring Provider (PT) Nicholas Lose, MD    Onset Date/Surgical Date --   2016   Next MD Visit yes    Prior Therapy no                         [facility] Adult PT Treatment/Exercise - 04/08/20 0001  Neuro Re-ed    Neuro Re-ed Details  diaphragmatic breathing with exhale and bulge coordination in quadruped and sitting with feet on squatty potty, PT provided TCs at abdomen and VCs for push belly out with exhale to bulge      Lumbar Exercises: Quadruped   Other Quadruped Lumbar Exercises anterior pelvic tilt and abdomen drop, rock back to simulate toileting posture      Manual Therapy   Manual Therapy Soft tissue mobilization    Soft tissue mobilization abdominal massage, fascial release around ribcage back to front bilaterally                    PT Short Term Goals - 03/25/20 1026      PT SHORT TERM GOAL #1   Title Pt will be ind with initial HEP and self-care strategies for pelvic floor strength  and symptom management.    Baseline PT gave splinting, bowel routine, urge drill, knack, toileting techniques, PF contractions and diaphragmatic breathing at eval    Status Achieved      PT SHORT TERM GOAL #2   Title Pt will be able to perform 10x5 sec holds for PF contractions without substitution in anti-gravity position.    Status Achieved      PT SHORT TERM GOAL #3   Title Pt will achieve flexibility in LE adductors and hamstrings to [facility] bil.    Status Achieved      PT SHORT TERM GOAL #4   Title Pt will verbalize at least 2 strategies she is implementing to estabilish improved bowel routine    Status Achieved      PT SHORT TERM GOAL #5   Title Pt will be able to demo good diaphragmatic breathing for PF relaxation and stress management benefits.    Status Achieved             PT Long Term Goals - 04/08/20 1625      PT LONG TERM GOAL #1   Title Pt will be ind with advanced HEP and implement daily or weekly strategies for improved pelvic floor function around toileting, strength, bowel routine and sexual function.    Status Achieved      PT LONG TERM GOAL #2   Title Pt will achieve Brisol Stool Scale Type 2 or Type 3 every 1-3 days using proper toileting and splinting techniques to improve defecation ease and support prolapse.    Baseline Type 1-Type 4    Status Partially Met      PT LONG TERM GOAL #3   Title Pt will achieve PF strength of at least 3/5 and report reduced incidence of urinary leakage by at least 50%    Status Partially Met      PT LONG TERM GOAL #4   Title Pt will reduce Marinoff Score from 1/3 to 0/3 at least 50% of the time.    Status Deferred                 Plan - 04/08/20 1620    Clinical Impression Statement Pt continues to have mixed success with improved BMs.  She has met all STGs and met or partially met all LTGs.  BMs range from Type 1 to Type 4,, sometimes starting with Type 1 and finishing with a large Type 4.  She intermittently has  to use manual evacuation techniques or glycerin suppositories.  She reports feeling like the stool sometimes does not descend, so PT has been working on abdominal massage  and instruction for Pt to perform this at home.  PT has educated Pt on toileting techniques, breathing, creating a bowel routine, defecation strategies for splinting and posture, and water/food intake for adequate hydration and fiber levels.  Pt has HEP and strategies to continue using to see if further improvements are made regarding constipation.  PT recommends further consult with referring MD should she not see further improvements with constipation management. D/C to HEP and self-care strategies.    Comorbidities rectocele, cystocele, Hx of breast CA (estrogen receptor negative)    PT Frequency 1x / week    PT Duration 12 weeks    PT Treatment/Interventions ADLs/Self Care Home Management;Electrical Stimulation;Moist Heat;Neuromuscular re-education;Therapeutic exercise;Functional mobility training;Therapeutic activities;Patient/family education;Manual techniques;Passive range of motion;Spinal Manipulations;Joint Manipulations    PT Next Visit Plan d/c to HEP and self-care strategies    PT Home Exercise Plan Access Code: [email or web address] (PF contractions and diaphragmatic breathing), + toileting techniques, bowel routine, the Knack, Urge Drill    Consulted and Agree with Plan of Care Patient           Patient will benefit from skilled therapeutic intervention in order to improve the following deficits and impairments:     Visit Diagnosis: Muscle weakness (generalized)  Other lack of coordination     Problem List Patient Active Problem List   Diagnosis Date Noted  . Allergic dermatitis 06/19/2015  . Hypotension 12/30/2014  . External hemorrhoid 12/13/2014  . UTI (urinary tract infection) 11/16/2014  . Genetic testing 11/07/2014  . Migraine headache 11/06/2014  . Family history of breast cancer   . Family history of colon  cancer   . Breast cancer of upper-outer quadrant of left female breast ([place]) 10/11/2014     PHYSICAL THERAPY DISCHARGE SUMMARY  Visits from Start of Care: 8  Current functional level related to goals / functional outcomes: See above   Remaining deficits: See above   Education / Equipment: HEP, self-care strategies, behavioral strategies for bowel routine Plan: Patient agrees to discharge.  Patient goals were partially met. Patient is being discharged due to the patient's request.  ?????         Baruch Merl, PT 04/08/20 4:32 PM  PHYSICAL THERAPY DISCHARGE SUMMARY  Visits from Start of Care: 8  Current functional level related to goals / functional outcomes: See above  Remaining deficits: See above   Education / Equipment: HEP Plan: Patient agrees to discharge.  Patient goals were not met. Patient is being discharged due to lack of progress.  ?????         Baruch Merl, PT 06/03/20 10:06 AM   [place] [address], [place], [place], [address] Phone: [phone]   Fax:  [phone]  Name: [name] [name] MRN: [id number] Date of Birth: 03/30/1961

## 2020-04-15 ENCOUNTER — Encounter: Payer: BC Managed Care – PPO | Admitting: Physical Therapy

## 2021-02-18 NOTE — Assessment & Plan Note (Deleted)
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:00 position, left axillary lymph node palpable, ER/PR negative, 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 5.32, T1 cN1 M0 stage II a clinical stage Treatment summary: Neo adjuvant chemotherapy with [facility] for general every 3 weeks 6 cycles started 11/01/2014 completed 02/14/2015 Post-neo-adjuvant breast MRI 02/17/2015: No residual mass or abnormal enhancement, no enlarged lymph nodes Post-neo-adjuvant CT chest 02/18/2015: Interval resolution of the left breast mass and multiple enlarged left axillary and subpectoral lymph nodes Bilateral mastectomies11/21/2016 Left: Radial scar, FA, UDH no malignancy 0/1 lymph node negative; right: FA, radial scar, U[facility] no malignancy (pathologic complete response) [id number] Herceptin maintenance completedApril 2017 Adjuvant radiation therapy 08/06/2015- 09/12/15 ---------------------------------------------------------------------------------------------------------------------------------------------------- Current treatment:Surveillance Patient is going [place] in [place] and is on multiple supplements. Aphthous ulcers: Being managed by her primary care and her dentist as well as her holistic physician. Dr. Lucia Gaskins  Breast cancer surveillance: 1.No role ofbreastimaging because she had bilateral mastectomies 2.02/19/21: chest examination did not reveal any lumps or nodules of concern.  CT CAP 02/13/2020: No evidence of recurrent malignancy.  No need of further CT scans. Return to clinic on an as-needed basis.

## 2021-02-19 ENCOUNTER — Inpatient Hospital Stay: Payer: BC Managed Care – PPO | Admitting: Hematology and Oncology

## 2021-02-19 DIAGNOSIS — C50412 Malignant neoplasm of upper-outer quadrant of left female breast: Secondary | ICD-10-CM

## 2021-03-12 ENCOUNTER — Ambulatory Visit: Payer: BC Managed Care – PPO | Admitting: Hematology and Oncology

## 2023-03-24 ENCOUNTER — Other Ambulatory Visit (HOSPITAL_BASED_OUTPATIENT_CLINIC_OR_DEPARTMENT_OTHER): Payer: Self-pay
